# Patient Record
Sex: Female | Born: 1970 | Race: White | Hispanic: No | Smoking: Current every day smoker
Health system: Southern US, Academic
[De-identification: ages and names within clinical notes are randomized; demographics above are authoritative.]

## PROBLEM LIST (undated history)

## (undated) ENCOUNTER — Ambulatory Visit: Admission: EM | Payer: Disability Insurance | Source: Home / Self Care

## (undated) DIAGNOSIS — M129 Arthropathy, unspecified: Secondary | ICD-10-CM

## (undated) DIAGNOSIS — I359 Nonrheumatic aortic valve disorder, unspecified: Secondary | ICD-10-CM

## (undated) DIAGNOSIS — I10 Essential (primary) hypertension: Secondary | ICD-10-CM

## (undated) DIAGNOSIS — I2510 Atherosclerotic heart disease of native coronary artery without angina pectoris: Secondary | ICD-10-CM

## (undated) DIAGNOSIS — R55 Syncope and collapse: Secondary | ICD-10-CM

## (undated) DIAGNOSIS — F32A Depression, unspecified: Secondary | ICD-10-CM

## (undated) DIAGNOSIS — J449 Chronic obstructive pulmonary disease, unspecified: Secondary | ICD-10-CM

## (undated) DIAGNOSIS — F4310 Post-traumatic stress disorder, unspecified: Secondary | ICD-10-CM

## (undated) DIAGNOSIS — F329 Major depressive disorder, single episode, unspecified: Secondary | ICD-10-CM

## (undated) DIAGNOSIS — E079 Disorder of thyroid, unspecified: Secondary | ICD-10-CM

## (undated) DIAGNOSIS — F419 Anxiety disorder, unspecified: Secondary | ICD-10-CM

## (undated) DIAGNOSIS — I639 Cerebral infarction, unspecified: Secondary | ICD-10-CM

## (undated) DIAGNOSIS — G43909 Migraine, unspecified, not intractable, without status migrainosus: Secondary | ICD-10-CM

## (undated) DIAGNOSIS — Z87442 Personal history of urinary calculi: Secondary | ICD-10-CM

## (undated) DIAGNOSIS — I351 Nonrheumatic aortic (valve) insufficiency: Secondary | ICD-10-CM

## (undated) DIAGNOSIS — T4145XA Adverse effect of unspecified anesthetic, initial encounter: Secondary | ICD-10-CM

## (undated) DIAGNOSIS — G529 Cranial nerve disorder, unspecified: Secondary | ICD-10-CM

## (undated) DIAGNOSIS — A389 Scarlet fever, uncomplicated: Secondary | ICD-10-CM

## (undated) DIAGNOSIS — E049 Nontoxic goiter, unspecified: Secondary | ICD-10-CM

## (undated) DIAGNOSIS — E039 Hypothyroidism, unspecified: Secondary | ICD-10-CM

## (undated) DIAGNOSIS — T8859XA Other complications of anesthesia, initial encounter: Secondary | ICD-10-CM

## (undated) DIAGNOSIS — Z8619 Personal history of other infectious and parasitic diseases: Secondary | ICD-10-CM

## (undated) DIAGNOSIS — K219 Gastro-esophageal reflux disease without esophagitis: Secondary | ICD-10-CM

## (undated) DIAGNOSIS — IMO0001 Reserved for inherently not codable concepts without codable children: Secondary | ICD-10-CM

## (undated) DIAGNOSIS — R9389 Abnormal findings on diagnostic imaging of other specified body structures: Secondary | ICD-10-CM

## (undated) DIAGNOSIS — J45909 Unspecified asthma, uncomplicated: Secondary | ICD-10-CM

## (undated) DIAGNOSIS — D649 Anemia, unspecified: Secondary | ICD-10-CM

## (undated) DIAGNOSIS — N879 Dysplasia of cervix uteri, unspecified: Secondary | ICD-10-CM

## (undated) DIAGNOSIS — Z8661 Personal history of infections of the central nervous system: Secondary | ICD-10-CM

## (undated) HISTORY — PX: BILATERAL SALPINGOOPHORECTOMY: SHX1223

## (undated) HISTORY — PX: DENTAL SURGERY: SHX609

## (undated) HISTORY — PX: HX GALL BLADDER SURGERY/CHOLE: SHX55

## (undated) HISTORY — PX: SINUS SURGERY: SHX187

## (undated) HISTORY — PX: ABDOMINAL SURGERY: SHX537

## (undated) HISTORY — PX: NASAL SEPTUM SURGERY: SHX37

## (undated) HISTORY — DX: Chronic obstructive pulmonary disease, unspecified: J44.9

---
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
## 1993-11-05 DIAGNOSIS — R1319 Other dysphagia: Secondary | ICD-10-CM

## 1993-11-05 HISTORY — DX: Other dysphagia: R13.19

## 2004-12-06 ENCOUNTER — Emergency Department: Payer: Self-pay | Admitting: Emergency Medicine

## 2005-01-31 ENCOUNTER — Encounter: Payer: Self-pay | Admitting: Neurology

## 2005-02-03 ENCOUNTER — Encounter: Payer: Self-pay | Admitting: Neurology

## 2005-12-05 ENCOUNTER — Emergency Department: Payer: Self-pay | Admitting: Unknown Physician Specialty

## 2005-12-05 ENCOUNTER — Other Ambulatory Visit: Payer: Self-pay

## 2005-12-13 ENCOUNTER — Ambulatory Visit: Payer: Self-pay | Admitting: Internal Medicine

## 2006-04-06 ENCOUNTER — Emergency Department: Payer: Self-pay | Admitting: Emergency Medicine

## 2007-07-01 ENCOUNTER — Emergency Department: Payer: Self-pay

## 2007-10-01 ENCOUNTER — Emergency Department: Payer: Self-pay | Admitting: Internal Medicine

## 2008-05-03 ENCOUNTER — Encounter
Admission: RE | Admit: 2008-05-03 | Discharge: 2008-05-03 | Payer: Self-pay | Admitting: Physical Medicine & Rehabilitation

## 2008-10-14 ENCOUNTER — Emergency Department: Payer: Self-pay | Admitting: Emergency Medicine

## 2013-11-05 HISTORY — PX: CHOLECYSTECTOMY: SHX55

## 2013-11-24 ENCOUNTER — Emergency Department: Payer: Self-pay | Admitting: Emergency Medicine

## 2013-11-24 LAB — CBC WITH DIFFERENTIAL/PLATELET
Basophil #: 0.1 10*3/uL (ref 0.0–0.1)
Basophil %: 0.9 %
Eosinophil #: 0.1 10*3/uL (ref 0.0–0.7)
Eosinophil %: 0.6 %
HCT: 41 % (ref 35.0–47.0)
HGB: 13.9 g/dL (ref 12.0–16.0)
Lymphocyte #: 1.9 10*3/uL (ref 1.0–3.6)
Lymphocyte %: 19.7 %
MCH: 34.8 pg — ABNORMAL HIGH (ref 26.0–34.0)
MCHC: 34 g/dL (ref 32.0–36.0)
MCV: 103 fL — ABNORMAL HIGH (ref 80–100)
Monocyte #: 0.5 x10 3/mm (ref 0.2–0.9)
Monocyte %: 5.6 %
Neutrophil #: 6.9 10*3/uL — ABNORMAL HIGH (ref 1.4–6.5)
Neutrophil %: 73.2 %
Platelet: 220 10*3/uL (ref 150–440)
RBC: 4 10*6/uL (ref 3.80–5.20)
RDW: 16.3 % — ABNORMAL HIGH (ref 11.5–14.5)
WBC: 9.5 10*3/uL (ref 3.6–11.0)

## 2013-11-24 LAB — URINALYSIS, COMPLETE
Bilirubin,UR: NEGATIVE
Glucose,UR: NEGATIVE mg/dL (ref 0–75)
Ketone: NEGATIVE
Leukocyte Esterase: NEGATIVE
Nitrite: NEGATIVE
Ph: 7 (ref 4.5–8.0)
Protein: NEGATIVE
RBC,UR: 7 /HPF (ref 0–5)
Specific Gravity: 1.003 (ref 1.003–1.030)
Squamous Epithelial: 1
WBC UR: <1 /HPF (ref 0–5)

## 2013-11-24 LAB — COMPREHENSIVE METABOLIC PANEL
Albumin: 3.7 g/dL (ref 3.4–5.0)
Alkaline Phosphatase: 112 U/L
Anion Gap: 5 — ABNORMAL LOW (ref 7–16)
BUN: 6 mg/dL — ABNORMAL LOW (ref 7–18)
Bilirubin,Total: 0.4 mg/dL (ref 0.2–1.0)
Calcium, Total: 8.9 mg/dL (ref 8.5–10.1)
Chloride: 107 mmol/L (ref 98–107)
Co2: 27 mmol/L (ref 21–32)
Creatinine: 0.37 mg/dL — ABNORMAL LOW (ref 0.60–1.30)
EGFR (African American): >60
EGFR (Non-African Amer.): >60
Glucose: 93 mg/dL (ref 65–99)
Osmolality: 275 (ref 275–301)
Potassium: 4.2 mmol/L (ref 3.5–5.1)
SGOT(AST): 103 U/L — ABNORMAL HIGH (ref 15–37)
SGPT (ALT): 35 U/L (ref 12–78)
Sodium: 139 mmol/L (ref 136–145)
Total Protein: 7.2 g/dL (ref 6.4–8.2)

## 2013-11-24 LAB — LIPASE, BLOOD: Lipase: 126 U/L (ref 73–393)

## 2014-04-12 ENCOUNTER — Emergency Department: Payer: Self-pay | Admitting: Emergency Medicine

## 2014-04-20 ENCOUNTER — Inpatient Hospital Stay: Payer: Self-pay | Admitting: Surgery

## 2014-04-20 LAB — COMPREHENSIVE METABOLIC PANEL
Albumin: 4 g/dL (ref 3.4–5.0)
Alkaline Phosphatase: 109 U/L
Anion Gap: 10 (ref 7–16)
BUN: 11 mg/dL (ref 7–18)
Bilirubin,Total: 0.2 mg/dL (ref 0.2–1.0)
Calcium, Total: 9.2 mg/dL (ref 8.5–10.1)
Chloride: 104 mmol/L (ref 98–107)
Co2: 25 mmol/L (ref 21–32)
Creatinine: 0.54 mg/dL — ABNORMAL LOW (ref 0.60–1.30)
EGFR (African American): >60
EGFR (Non-African Amer.): >60
Glucose: 137 mg/dL — ABNORMAL HIGH (ref 65–99)
Osmolality: 279 (ref 275–301)
Potassium: 4 mmol/L (ref 3.5–5.1)
SGOT(AST): 38 U/L — ABNORMAL HIGH (ref 15–37)
SGPT (ALT): 52 U/L (ref 12–78)
Sodium: 139 mmol/L (ref 136–145)
Total Protein: 7.6 g/dL (ref 6.4–8.2)

## 2014-04-20 LAB — URINALYSIS, COMPLETE
Bacteria: NONE SEEN
Bilirubin,UR: NEGATIVE
Glucose,UR: NEGATIVE mg/dL (ref 0–75)
Ketone: NEGATIVE
Leukocyte Esterase: NEGATIVE
Nitrite: NEGATIVE
Ph: 6 (ref 4.5–8.0)
Protein: 30
RBC,UR: 20 /HPF (ref 0–5)
Specific Gravity: 1.021 (ref 1.003–1.030)
Squamous Epithelial: 2
WBC UR: 2 /HPF (ref 0–5)

## 2014-04-20 LAB — CBC WITH DIFFERENTIAL/PLATELET
Basophil #: 0.2 10*3/uL — ABNORMAL HIGH (ref 0.0–0.1)
Basophil %: 0.8 %
Eosinophil #: 0 10*3/uL (ref 0.0–0.7)
Eosinophil %: 0.2 %
HCT: 45 % (ref 35.0–47.0)
HGB: 14.6 g/dL (ref 12.0–16.0)
Lymphocyte #: 1.8 10*3/uL (ref 1.0–3.6)
Lymphocyte %: 9 %
MCH: 33.4 pg (ref 26.0–34.0)
MCHC: 32.4 g/dL (ref 32.0–36.0)
MCV: 103 fL — ABNORMAL HIGH (ref 80–100)
Monocyte #: 0.7 x10 3/mm (ref 0.2–0.9)
Monocyte %: 3.7 %
Neutrophil #: 16.9 10*3/uL — ABNORMAL HIGH (ref 1.4–6.5)
Neutrophil %: 86.3 %
Platelet: 321 10*3/uL (ref 150–440)
RBC: 4.36 10*6/uL (ref 3.80–5.20)
RDW: 16.5 % — ABNORMAL HIGH (ref 11.5–14.5)
WBC: 19.5 10*3/uL — ABNORMAL HIGH (ref 3.6–11.0)

## 2014-04-20 LAB — LIPASE, BLOOD: Lipase: 127 U/L (ref 73–393)

## 2014-04-20 LAB — PREGNANCY, URINE: Pregnancy Test, Urine: NEGATIVE m[IU]/mL

## 2014-04-21 LAB — CBC WITH DIFFERENTIAL/PLATELET
Basophil #: 0 10*3/uL (ref 0.0–0.1)
Basophil %: 0.5 %
Eosinophil #: 0.1 10*3/uL (ref 0.0–0.7)
Eosinophil %: 1.2 %
HCT: 37.8 % (ref 35.0–47.0)
HGB: 12.5 g/dL (ref 12.0–16.0)
Lymphocyte #: 1.3 10*3/uL (ref 1.0–3.6)
Lymphocyte %: 17.8 %
MCH: 34.2 pg — ABNORMAL HIGH (ref 26.0–34.0)
MCHC: 33 g/dL (ref 32.0–36.0)
MCV: 104 fL — ABNORMAL HIGH (ref 80–100)
Monocyte #: 0.5 x10 3/mm (ref 0.2–0.9)
Monocyte %: 6.8 %
Neutrophil #: 5.3 10*3/uL (ref 1.4–6.5)
Neutrophil %: 73.7 %
Platelet: 222 10*3/uL (ref 150–440)
RBC: 3.64 10*6/uL — ABNORMAL LOW (ref 3.80–5.20)
RDW: 16.7 % — ABNORMAL HIGH (ref 11.5–14.5)
WBC: 7.2 10*3/uL (ref 3.6–11.0)

## 2014-04-21 LAB — BASIC METABOLIC PANEL
Anion Gap: 7 (ref 7–16)
BUN: 4 mg/dL — ABNORMAL LOW (ref 7–18)
Calcium, Total: 8.6 mg/dL (ref 8.5–10.1)
Chloride: 104 mmol/L (ref 98–107)
Co2: 28 mmol/L (ref 21–32)
Creatinine: 0.38 mg/dL — ABNORMAL LOW (ref 0.60–1.30)
EGFR (African American): >60
EGFR (Non-African Amer.): >60
Glucose: 80 mg/dL (ref 65–99)
Osmolality: 273 (ref 275–301)
Potassium: 3.7 mmol/L (ref 3.5–5.1)
Sodium: 139 mmol/L (ref 136–145)

## 2014-04-21 LAB — HEPATIC FUNCTION PANEL A (ARMC)
Albumin: 3.2 g/dL — ABNORMAL LOW (ref 3.4–5.0)
Alkaline Phosphatase: 107 U/L
Bilirubin, Direct: <0.1 mg/dL (ref 0.00–0.20)
Bilirubin,Total: 0.4 mg/dL (ref 0.2–1.0)
SGOT(AST): 106 U/L — ABNORMAL HIGH (ref 15–37)
SGPT (ALT): 90 U/L — ABNORMAL HIGH (ref 12–78)
Total Protein: 6.4 g/dL (ref 6.4–8.2)

## 2014-04-21 LAB — LIPASE, BLOOD: Lipase: 69 U/L — ABNORMAL LOW (ref 73–393)

## 2014-04-22 LAB — CBC WITH DIFFERENTIAL/PLATELET
Basophil #: 0.1 10*3/uL (ref 0.0–0.1)
Basophil %: 0.9 %
Eosinophil #: 0.1 10*3/uL (ref 0.0–0.7)
Eosinophil %: 1 %
HCT: 35.4 % (ref 35.0–47.0)
HGB: 11.9 g/dL — ABNORMAL LOW (ref 12.0–16.0)
Lymphocyte #: 1.4 10*3/uL (ref 1.0–3.6)
Lymphocyte %: 15.8 %
MCH: 34.9 pg — ABNORMAL HIGH (ref 26.0–34.0)
MCHC: 33.6 g/dL (ref 32.0–36.0)
MCV: 104 fL — ABNORMAL HIGH (ref 80–100)
Monocyte #: 0.6 x10 3/mm (ref 0.2–0.9)
Monocyte %: 6.7 %
Neutrophil #: 6.7 10*3/uL — ABNORMAL HIGH (ref 1.4–6.5)
Neutrophil %: 75.6 %
Platelet: 181 10*3/uL (ref 150–440)
RBC: 3.41 10*6/uL — ABNORMAL LOW (ref 3.80–5.20)
RDW: 16.5 % — ABNORMAL HIGH (ref 11.5–14.5)
WBC: 8.9 10*3/uL (ref 3.6–11.0)

## 2014-04-22 LAB — BASIC METABOLIC PANEL
Anion Gap: 5 — ABNORMAL LOW (ref 7–16)
BUN: 3 mg/dL — ABNORMAL LOW (ref 7–18)
Calcium, Total: 8.2 mg/dL — ABNORMAL LOW (ref 8.5–10.1)
Chloride: 105 mmol/L (ref 98–107)
Co2: 26 mmol/L (ref 21–32)
Creatinine: 0.47 mg/dL — ABNORMAL LOW (ref 0.60–1.30)
EGFR (African American): >60
EGFR (Non-African Amer.): >60
Glucose: 80 mg/dL (ref 65–99)
Osmolality: 267 (ref 275–301)
Potassium: 3.8 mmol/L (ref 3.5–5.1)
Sodium: 136 mmol/L (ref 136–145)

## 2014-04-24 LAB — PATHOLOGY REPORT

## 2014-05-31 ENCOUNTER — Ambulatory Visit: Payer: Self-pay | Admitting: Surgery

## 2014-06-07 ENCOUNTER — Emergency Department: Payer: Self-pay | Admitting: Emergency Medicine

## 2014-06-15 ENCOUNTER — Other Ambulatory Visit: Payer: Self-pay | Admitting: Surgery

## 2014-06-15 LAB — COMPREHENSIVE METABOLIC PANEL
Albumin: 3.8 g/dL (ref 3.4–5.0)
Alkaline Phosphatase: 97 U/L
Anion Gap: 4 — ABNORMAL LOW (ref 7–16)
BUN: 8 mg/dL (ref 7–18)
Bilirubin,Total: 0.3 mg/dL (ref 0.2–1.0)
Calcium, Total: 8.6 mg/dL (ref 8.5–10.1)
Chloride: 108 mmol/L — ABNORMAL HIGH (ref 98–107)
Co2: 26 mmol/L (ref 21–32)
Creatinine: 0.75 mg/dL (ref 0.60–1.30)
EGFR (African American): >60
EGFR (Non-African Amer.): >60
Glucose: 101 mg/dL — ABNORMAL HIGH (ref 65–99)
Osmolality: 274 (ref 275–301)
Potassium: 3.8 mmol/L (ref 3.5–5.1)
SGOT(AST): 12 U/L — ABNORMAL LOW (ref 15–37)
SGPT (ALT): 17 U/L
Sodium: 138 mmol/L (ref 136–145)
Total Protein: 7.4 g/dL (ref 6.4–8.2)

## 2014-06-15 LAB — LIPASE, BLOOD: Lipase: 97 U/L (ref 73–393)

## 2014-07-26 DIAGNOSIS — R0602 Shortness of breath: Secondary | ICD-10-CM | POA: Insufficient documentation

## 2014-07-26 DIAGNOSIS — I350 Nonrheumatic aortic (valve) stenosis: Secondary | ICD-10-CM | POA: Insufficient documentation

## 2015-02-26 NOTE — Op Note (Signed)
PATIENT NAME:  Michelle Conley, Gloriana M MR#:  [id number] DATE OF BIRTH:  Apr 03, 1971  DATE OF PROCEDURE:  04/21/2014  PREOPERATIVE DIAGNOSIS: Acute cholecystitis.   POSTOPERATIVE DIAGNOSIS: Acute cholecystitis.     PROCEDURE PERFORMED: Laparoscopic cholecystectomy.   SURGEON: Quentin Orealph L. Ely III, MD   ANESTHESIA: General.   OPERATIVE PROCEDURE: With the patient in the supine position after the induction of appropriate general anesthesia, the patient's abdomen was prepped with ChloraPrep and draped with sterile towels. The patient was placed in the head down, feet up position. A small infraumbilical incision was made in the standard fashion, carried down bluntly through subcutaneous tissue. A Veress needle was used to cannulate the peritoneal cavity. CO2 was insufflated to appropriate pressure measurements. When approximately 2.5 liters of CO2 were instilled, the Veress needle was withdrawn and an 11 mm [employer] port was inserted into the peritoneal cavity. Intraperitoneal position was confirmed and CO2 was reinsufflated. The patient was placed in the head-up, feet-down position and rolled slightly to the left side. A subxiphoid transverse incision was made and an 11 mm port was inserted under direct vision. Two lateral ports, 5 mm in size, were inserted under direct vision. The gallbladder was distended thickened, edematous and erythematous. It was retracted superiorly and laterally, exposing the hepatoduodenal ligament. The cystic duct was identified plus a very short cystic duct. The right hepatic was immediately behind the cystic duct with a small the cystic artery going to the gallbladder. Both artery and duct were quite short. Common duct was identified. The subxiphoid port was exchanged for a 12 mm port and the Endo GIA stapling device carrying a blue load was used to divide the cystic duct.  The cystic artery was clipped and divided.  The gallbladder was then dissected free of its bed using the hook  and cautery apparatus.  The gallbladder was quite edematous and there was a significant amount of vascular attachments in the bed of the liver. The gallbladder was grasped and removed through the subxiphoid incision. The area was copiously irrigated. A 19-French Blake drain was inserted through the subxiphoid port and brought out through 1 of the  lateral stab wounds.  It was placed in the bed of the liver.  It was secured with 3-0 nylon.  The subxiphoid incision was closed with figure-of-eight suture of 0 Vicryl using the suture passer. The areas were infiltrated with 0.25% Marcaine for postoperative pain control. The abdomen was desufflated. All ports were withdrawn without difficulty. Skin incisions were closed with 5-0 nylon and sterile dressings applied. The patient was returned to the recovery room, having tolerated the procedure well. Sponge, instrument and needle counts were correct x 2 in the Operating Room.   ____________________________ Quentin Orealph L. Ely III, MD rle:cs D: 04/21/2014 16:49:00 ET T: 04/21/2014 18:03:35 ET JOB#: [id number]  cc: Quentin Orealph L. Ely III, MD, <Dictator> Quentin OreALPH L ELY MD ELECTRONICALLY SIGNED 04/23/2014 18:15

## 2015-02-26 NOTE — Consult Note (Signed)
PATIENT NAME:  Michelle Conley, Michelle Conley MR#:  [id number] DATE OF BIRTH:  [birth year]  DATE OF CONSULTATION:  04/20/2014  REFERRING PHYSICIAN:  Bronson Ing, MD  CONSULTING PHYSICIAN:  Albertine Patricia, MD  REASON FOR MEDICAL CONSULT: Preop evaluation and medical management for a patient with acute cholecystitis.   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 The patient was admitted under the surgical service and planned to have surgical intervention in the morning. Hospitalists were consulted for medical management and preop evaluation given her significant past medical history. The patient is known to have history of COPD, uses oxygen on occasion. She is still a heavy smoker, more than 1 pack or more per day, but currently she does not have any wheezing. She does not have any cough. She does not have any productive sputum. As well, the patient reports she is known to have history of cardiac murmur and MVP in the past as was evaluated by Dr. Neoma Laming in the year 2003. She denies any significant shortness of breath out of her baseline. She denies any chest pain as well. The patient reports history of CVA in the past, but per further questioning, it appears she has had episode of viral encephalitis which resulted in her having some deficits requiring physical therapy for some time, so actually did not have a CVA. It was a viral encephalitis episode. The patient has significant leukocytosis at 19,000. Her urinalysis was negative. The patient remains afebrile on the floor. As well, she was afebrile in the ED.   PAST MEDICAL HISTORY:  1. Heart murmur.  2. MVP.  3. Viral encephalitis.   4. Asthma.  5. COPD.   6.  Hypothyroidism.   PAST SURGICAL HISTORY:  1. C-section.  2. Nasal septum repair.  3. Thyroid radioablation.   SOCIAL HISTORY: The patient smokes 1 pack per day. Denies alcohol or illicit drug use.   FAMILY HISTORY: Denies any family history of coronary artery disease at a young age.   ALLERGIES: CODEINE, ELAVIL, FLAGYL, FLEXERIL, NEXIUM, PENICILLIN, PROZAC, ULTRAM, VIOXX, ZOLOFT.   HOME MEDICATIONS:  1. Alprazolam 2 mg oral 1 tablet every 4 hours as needed.  2. Levothyroxine 300 mcg oral daily.  3. ProAir as needed.  4. Soma 350 mg oral 4 times a day as needed.  5. Spiriva 18 mcg inhalational daily.   REVIEW OF SYSTEMS:  CONSTITUTIONAL: The patient denies fever, chills. Complains of fatigue, weakness.  EYES: Denies blurry vision, double vision, inflammation.  ENT: Denies tinnitus, ear pain, hearing loss, epistaxis.  RESPIRATORY: Denies cough, wheezing, hemoptysis. Reports history of COPD and baseline shortness of breath.  CARDIOVASCULAR: Denies chest pain, edema, palpitation, syncope.  GASTROINTESTINAL: Denies diarrhea, constipation, coffee-ground emesis. Reports nausea, abdominal pain.  GENITOURINARY: Denies dysuria, hematuria or renal colic.  ENDOCRINE: Denies polyuria, polydipsia, heat or cold intolerance. As well, reports hypothyroidism.  HEMATOLOGY: Denies anemia, easy bruising, bleeding diathesis.  INTEGUMENTARY: Denies acne, rash or skin lesion.  MUSCULOSKELETAL: Denies any swelling, gout, cramps.  NEUROLOGIC: Denies any history of focal deficits, numbness, weakness. 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  PSYCHIATRIC: Reports history of anxiety. Denies insomnia or depression.   PHYSICAL EXAMINATION:  VITAL SIGNS: Temperature 97.8, pulse 66, respiratory rate 14, blood pressure 155/76,  GENERAL:  Well-nourished female, looks comfortable in bed, in no apparent distress.  HEENT: Head atraumatic, normocephalic. Pupils equal, reactive to light. Pink conjunctivae.  Anicteric sclerae. Moist oral mucosa.  NECK: Supple. No thyromegaly. No JVD.  CHEST: Good air entry bilaterally. No wheezing, rales or rhonchi.  CARDIOVASCULAR: S1, S2 heard. No rubs, murmurs or gallops.  ABDOMEN: Tender to palpation in the epigastric area and right upper quadrant. Otherwise bowel sounds present. No rebound. No guarding. No distention.  EXTREMITIES: No edema. No clubbing. No cyanosis. Pedal pulses felt bilaterally.  PSYCHIATRIC: Appropriate affect. Awake, alert x 3. Intact judgment and insight.  NEUROLOGIC: Cranial nerves grossly intact. Motor 5 out of 5. No focal deficits.  MUSCULOSKELETAL: No joint effusion or erythema.  SKIN: Normal skin turgor. Warm and dry.   PERTINENT LABORATORIES: Glucose 137, BUN 11, creatinine 0.54, sodium 139, potassium 4, chloride 104, CO2 25. ALT 52, AST 38, alk phos 109. White blood cells 19.5, hemoglobin 14.6, hematocrit 45, platelets 321. Urinalysis negative for leukocyte esterase and nitrite.   EKG showing normal sinus rhythm without significant ST or T wave abnormality, at 73 beats per minute.   IMAGING: Ultrasound showing changes consistent with acute cholecystitis in the appropriate clinical setting and mild common bile duct dilatation is noted as well.   ASSESSMENT AND PLAN:  1. Acute cholecystitis with preoperative evaluation: The patient is on intravenous antibiotics. Planned to have surgery in the morning. She is known to have history of chronic obstructive pulmonary disease but does not appear to be in chronic obstructive pulmonary disease exacerbation. Will start her on DuoNebs and p.r.n. albuterol. Continue her with Spiriva and p.r.n. oxygen and given her cardiac history, I could not appreciate any significant murmur on physical exam. She does not have any acute findings on her EKG, but we will obtain a 2-D echocardiogram in the morning. If the patient has no significant valvular disease, can proceed with surgery. Will keep her on p.r.n.  oxygen as well.  2. History of chronic obstructive pulmonary disease: The patient has no active wheezing. Continue with Spiriva, DuoNeb and p.r.n. albuterol.  3. Hypothyroidism: Continue with Synthroid.  4. History of asthma: Continue with Spiriva, DuoNebs and p.r.n. albuterol.  5. Tobacco abuse: The patient was counseled.  6. Deep vein thrombosis prophylaxis: The patient is on sequential compression devices and thromboembolic deterrent hose. Will recommend her to be started on chemical anticoagulation after surgery.   ____________________________ Albertine Patricia, MD dse:gb D: 04/20/2014 23:35:33 ET T: 04/21/2014 01:02:22 ET JOB#: [id number]  cc: Albertine Patricia, MD, <Dictator> DAWOOD Graciela Husbands MD ELECTRONICALLY SIGNED 04/21/2014 1:53

## 2015-02-26 NOTE — Discharge Summary (Signed)
PATIENT NAME:  Michelle Conley, Michelle Conley MR#:  [id number] DATE OF BIRTH:  Mar 12, 1971  DATE OF ADMISSION:  04/20/2014 DATE OF DISCHARGE:  04/23/2014  BRIEF HISTORY:  Michelle Conley is a 44 year old women admitted to the Emergency Room with clinical presentation and work-up with imaging and laboratory work suggestive of acute cholecystitis. She had similar symptoms in the past, none of which were as severe as the current episode. She had multiple other medical problems including possible valvular heart disease, mitral valve prolapse, chronic obstructive lung disease, tobacco abuse, and previous stroke. She was admitted, evaluated by cardiology and medicine. Work-up revealed severe aortic regurgitation, but no evidence of any significant contraindications for surgical intervention. She was taken to surgery on June 17, where she underwent a laparoscopic cholecystectomy. The procedure was uncomplicated. She had severe cholecystitis. There was no sign of any ductal obstruction. The surgery was uncomplicated. She had some mild pain and nausea control problems after surgery. She was discharged home today to be followed in the office in 7-10 days' time for follow-up and wound evaluation. Jackson-Pratt drain was removed prior to discharge.   DISCHARGE MEDICATIONS: Include levothyroxine 0.3 mg once a day, soma 350 mg q.i.d., alprazolam 2 mg q.i.d., ProAir 90 two puffs q. 4 hours, Spiriva 18 mcg once a day, and Percocet 5/325 every six hours p.r.n. pain.  FINAL DISCHARGE DIAGNOSES: Acute cholecystitis, chronic obstructive lung disease, aortic regurgitation, previous stroke.   ____________________________ Carmie Endalph L. Ely III, MD rle:ts D: 04/23/2014 12:21:21 ET T: 04/23/2014 13:29:28 ET JOB#: [id number]  cc: Quentin Orealph L. Ely III, MD, <Dictator> Alan MulderShamil J. Morayati, MD Meindert A. Lacie ScottsNiemeyer, MD Quentin OreALPH L ELY MD ELECTRONICALLY SIGNED 04/23/2014 18:19

## 2015-05-03 DIAGNOSIS — I351 Nonrheumatic aortic (valve) insufficiency: Secondary | ICD-10-CM | POA: Insufficient documentation

## 2015-05-03 DIAGNOSIS — E782 Mixed hyperlipidemia: Secondary | ICD-10-CM | POA: Insufficient documentation

## 2015-05-20 DIAGNOSIS — I10 Essential (primary) hypertension: Secondary | ICD-10-CM | POA: Insufficient documentation

## 2015-06-07 NOTE — H&P (Signed)
  Michelle Conley is an 44 y.o. female.   Chief Complaint: "Extractions for dentures" HPI: Michelle Conley is a 44 year old female that was referred by her general dentist for the extraction of all her remaining teeth in preparation for a denture.  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  Given her history and severe anxiety she will require having her surgery performed in an Operating Room setting under general anesthesia.    PMHx: Currently sees Dr. Arnoldo Hooker Non-Rheumatic Aortic Insufficiency, Stroke - hospitalized in 2003 (associated with the viral encephalitis - happened during the coma event and had temporary right side paralysis)  Mitral valve prolapse - follows up with cardiologist every year - premedicates Asthma Thyroid - had radition therapy and takes meds to supplement Chronic fatigue Severe anxiety - takes xanax daily, depression, PTSD Electric shock therapy.  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   PSx: Gallbladder removed, c section, cervical dysplasia - removed cervix (1995), cholecystectomy (2015)  Family Hx: Mother - small cell carcinoma  Social History:  Smokes tobacco: 2 ppd - 20 year hx  Allergies: Flagyl, flexeril, nexium, tramadol, vioxx, penicillin  Meds:  Tylenol #3, xanax, amoxicillin, epipen, lexapro, ibuprofen, synthroid, proair, qvar  Labs: No results found for this or any previous visit (from the past 48 hour(s)).  Radiology: No results found.  ROS: Pertinent items are noted in HPI.  Vitals:  BP: 110/69, Pulse: 68, RR: 12, SpO2: 99%, Temp 98.4  Physical Exam: General appearance: alert and cooperative Head: Normocephalic, without obvious abnormality, atraumatic Eyes: positive findings: pupillary abnormality: anisocoria both eyes Ears: normal TM's and external ear canals both ears Nose: Nares normal. Septum midline. Mucosa normal. No drainage or sinus  tenderness. Throat: abnormal findings: dentition: multiple carries Cardio: aortic insuff. and mitral prolapse GI: soft, non-tender; bowel sounds normal; no masses,  no organomegaly Extremities: extremities normal, atraumatic, no cyanosis or edema Skin: Skin color, texture, turgor normal. No rashes or lesions Neurologic: Alert and oriented X 3, normal strength and tone. Normal symmetric reflexes. Normal coordination and gait Incision/Wound:none  Panorex in office shows rampant caries and mild/moderate periodontal bone loss generalized throughout maxilla and mandible.  Assessment/Plan Michelle Conley has rampant caries and mild/moderate general chronic periodontal disease and will require extraction of all her remaining teeth and four quadrants of alveoloplasty.  The teeth included are: 2, 3, 4, 5, 6, 7, 8, 9, 10, 11, 12, 13, 14, 15, 19, 20, 21, 22, 23, 24, 25, 26, 27, 28, 29.    [place],Michelle Conley  06/07/2015, 6:20 PM

## 2015-06-13 ENCOUNTER — Encounter (HOSPITAL_COMMUNITY)
Admission: RE | Admit: 2015-06-13 | Discharge: 2015-06-13 | Disposition: A | Discharge status: Home / Self Care | Payer: Medicaid Other | Source: Ambulatory Visit | Attending: Oral and Maxillofacial Surgery | Admitting: Oral and Maxillofacial Surgery

## 2015-06-13 ENCOUNTER — Encounter (HOSPITAL_COMMUNITY): Payer: Self-pay

## 2015-06-13 DIAGNOSIS — E039 Hypothyroidism, unspecified: Secondary | ICD-10-CM | POA: Insufficient documentation

## 2015-06-13 DIAGNOSIS — Z8673 Personal history of transient ischemic attack (TIA), and cerebral infarction without residual deficits: Secondary | ICD-10-CM | POA: Diagnosis not present

## 2015-06-13 DIAGNOSIS — K219 Gastro-esophageal reflux disease without esophagitis: Secondary | ICD-10-CM | POA: Diagnosis not present

## 2015-06-13 DIAGNOSIS — Z79899 Other long term (current) drug therapy: Secondary | ICD-10-CM | POA: Diagnosis not present

## 2015-06-13 DIAGNOSIS — Z01812 Encounter for preprocedural laboratory examination: Secondary | ICD-10-CM | POA: Diagnosis not present

## 2015-06-13 DIAGNOSIS — Z8661 Personal history of infections of the central nervous system: Secondary | ICD-10-CM | POA: Diagnosis not present

## 2015-06-13 DIAGNOSIS — J45909 Unspecified asthma, uncomplicated: Secondary | ICD-10-CM | POA: Diagnosis not present

## 2015-06-13 DIAGNOSIS — I498 Other specified cardiac arrhythmias: Secondary | ICD-10-CM | POA: Insufficient documentation

## 2015-06-13 DIAGNOSIS — Z01818 Encounter for other preprocedural examination: Secondary | ICD-10-CM | POA: Diagnosis not present

## 2015-06-13 HISTORY — DX: Syncope and collapse: R55

## 2015-06-13 HISTORY — DX: Major depressive disorder, single episode, unspecified: F32.9

## 2015-06-13 HISTORY — DX: Gastro-esophageal reflux disease without esophagitis: K21.9

## 2015-06-13 HISTORY — DX: Nonrheumatic aortic valve disorder, unspecified: I35.9

## 2015-06-13 HISTORY — DX: Personal history of infections of the central nervous system: Z86.61

## 2015-06-13 HISTORY — DX: Reserved for inherently not codable concepts without codable children: IMO0001

## 2015-06-13 HISTORY — DX: Migraine, unspecified, not intractable, without status migrainosus: G43.909

## 2015-06-13 HISTORY — DX: Nonrheumatic aortic (valve) insufficiency: I35.1

## 2015-06-13 HISTORY — DX: Nontoxic goiter, unspecified: E04.9

## 2015-06-13 HISTORY — DX: Hypothyroidism, unspecified: E03.9

## 2015-06-13 HISTORY — DX: Cerebral infarction, unspecified: I63.9

## 2015-06-13 HISTORY — DX: Adverse effect of unspecified anesthetic, initial encounter: T41.45XA

## 2015-06-13 HISTORY — DX: Unspecified asthma, uncomplicated: J45.909

## 2015-06-13 HISTORY — DX: Anxiety disorder, unspecified: F41.9

## 2015-06-13 HISTORY — DX: Depression, unspecified: F32.A

## 2015-06-13 HISTORY — DX: Cranial nerve disorder, unspecified: G52.9

## 2015-06-13 HISTORY — DX: Other complications of anesthesia, initial encounter: T88.59XA

## 2015-06-13 HISTORY — DX: Post-traumatic stress disorder, unspecified: F43.10

## 2015-06-13 HISTORY — DX: Scarlet fever, uncomplicated: A38.9

## 2015-06-13 HISTORY — DX: Abnormal findings on diagnostic imaging of other specified body structures: R93.89

## 2015-06-13 HISTORY — DX: Anemia, unspecified: D64.9

## 2015-06-13 HISTORY — DX: Personal history of other infectious and parasitic diseases: Z86.19

## 2015-06-13 HISTORY — DX: Dysplasia of cervix uteri, unspecified: N87.9

## 2015-06-13 HISTORY — DX: Personal history of urinary calculi: Z87.442

## 2015-06-13 HISTORY — DX: Essential (primary) hypertension: I10

## 2015-06-13 LAB — COMPREHENSIVE METABOLIC PANEL
ALT: 10 U/L — ABNORMAL LOW (ref 14–54)
AST: 16 U/L (ref 15–41)
Albumin: 4 g/dL (ref 3.5–5.0)
Alkaline Phosphatase: 75 U/L (ref 38–126)
Anion gap: 5 (ref 5–15)
BUN: 9 mg/dL (ref 6–20)
CO2: 28 mmol/L (ref 22–32)
Calcium: 9.2 mg/dL (ref 8.9–10.3)
Chloride: 106 mmol/L (ref 101–111)
Creatinine, Ser: 1.02 mg/dL — ABNORMAL HIGH (ref 0.44–1.00)
GFR calc Af Amer: >60 mL/min (ref >60)
GFR calc non Af Amer: >60 mL/min (ref >60)
Glucose, Bld: 83 mg/dL (ref 65–99)
Potassium: 4.2 mmol/L (ref 3.5–5.1)
Sodium: 139 mmol/L (ref 135–145)
Total Bilirubin: 0.3 mg/dL (ref 0.3–1.2)
Total Protein: 6.7 g/dL (ref 6.5–8.1)

## 2015-06-13 LAB — HCG, SERUM, QUALITATIVE: Preg, Serum: NEGATIVE

## 2015-06-13 LAB — CBC
HCT: 42.6 % (ref 36.0–46.0)
Hemoglobin: 14 g/dL (ref 12.0–15.0)
MCH: 29.9 pg (ref 26.0–34.0)
MCHC: 32.9 g/dL (ref 30.0–36.0)
MCV: 91 fL (ref 78.0–100.0)
Platelets: 204 10*3/uL (ref 150–400)
RBC: 4.68 MIL/uL (ref 3.87–5.11)
RDW: 13.9 % (ref 11.5–15.5)
WBC: 10 10*3/uL (ref 4.0–10.5)

## 2015-06-13 NOTE — Progress Notes (Signed)
Patient denies Chest Pain, shob. Reports that PCP is Curator. Cardiologist is Dr. Gwen Pounds. Stress test in care everywhere. Spoke to [place] regarding patient report of goiter that she has been told she needs surgery for. Will request records from anesthesia in 2015.  Requested: CXR from [facility]; 2015 Anesthesia Records from [facility]; EKG Dr. Gwen Pounds.

## 2015-06-13 NOTE — Pre-Procedure Instructions (Signed)
MERRIAM BRANDNER  06/13/2015     Your procedure is scheduled on August 15.  Report to [facility] Admitting at 5:30 A.M.  Call this number if you have problems the morning of surgery:  [phone]   Remember:  Do not eat food or drink liquids after midnight.  Take these medicines the morning of surgery with A SIP OF WATER Tylenol #3, Xanax, Clindamycin, Hydrocodone (if needed), Levothyroxine, Proair, Spiriva, Omeprazole, Lexapro   STOP Ibuprofen today   STOP/ Do not take Aspirin, Aleve, Naproxen, Advil, Ibuprofen, Motrin, Vitamins, Herbs, or Supplements starting today   Do not wear jewelry, make-up or nail polish.  Do not wear lotions, powders, or perfumes.  You may wear deodorant.  Do not shave 48 hours prior to surgery.  Men may shave face and neck.  Do not bring valuables to the hospital.  [facility] is not responsible for any belongings or valuables.  Contacts, dentures or bridgework may not be worn into surgery.  Leave your suitcase in the car.  After surgery it may be brought to your room.  For patients admitted to the hospital, discharge time will be determined by your treatment team.  Patients discharged the day of surgery will not be allowed to drive home.   [place] - Preparing for Surgery  Before surgery, you can play an important role.  Because skin is not sterile, your skin needs to be as free of germs as possible.  You can reduce the number of germs on you skin by washing with CHG (chlorahexidine gluconate) soap before surgery.  CHG is an antiseptic cleaner which kills germs and bonds with the skin to continue killing germs even after washing.  Please DO NOT use if you have an allergy to CHG or antibacterial soaps.  If your skin becomes reddened/irritated stop using the CHG and inform your nurse when you arrive at Short Stay.  Do not shave (including legs and underarms) for at least 48 hours prior to the first CHG shower.  You may shave your  face.  Please follow these instructions carefully:   1.  Shower with CHG Soap the night before surgery and the morning of Surgery.  2.  If you choose to wash your hair, wash your hair first as usual with your normal shampoo.  3.  After you shampoo, rinse your hair and body thoroughly to remove the shampoo.  4.  Use CHG as you would any other liquid soap.  You can apply CHG directly to the skin and wash gently with scrungie or a clean washcloth.  5.  Apply the CHG Soap to your body ONLY FROM THE NECK DOWN.  Do not use on open wounds or open sores.  Avoid contact with your eyes, ears, mouth and genitals (private parts).  Wash genitals (private parts) with your normal soap.  6.  Wash thoroughly, paying special attention to the area where your surgery will be performed.  7.  Thoroughly rinse your body with warm water from the neck down.  8.  DO NOT shower/wash with your normal soap after using and rinsing off the CHG Soap.  9.  Pat yourself dry with a clean towel.            10.  Wear clean pajamas.            11.  Place clean sheets on your bed the night of your first shower and do not sleep with pets.  Day of  Surgery  Do not apply any lotions the morning of surgery.  Please wear clean clothes to the hospital/surgery center.    Please read over the following fact sheets that you were given. Pain Booklet, Coughing and Deep Breathing and Surgical Site Infection Prevention

## 2015-06-13 NOTE — Progress Notes (Signed)
   06/13/15 1340  OBSTRUCTIVE SLEEP APNEA  Have you ever been diagnosed with sleep apnea through a sleep study? No  Do you snore loudly (loud enough to be heard through closed doors)?  1  Do you often feel tired, fatigued, or sleepy during the daytime? 1  Has anyone observed you stop breathing during your sleep? 1  Do you have, or are you being treated for high blood pressure? 1  BMI more than 35 kg/m2? 0  Age over 42 years old? 0  Neck circumference greater than 40 cm/16 inches? 0  Gender: 0  Obstructive Sleep Apnea Score 4

## 2015-06-14 NOTE — Progress Notes (Signed)
Anesthesia Chart Review: Patient is a 44 year old female scheduled for multiple teeth extractions, upper labial frenectomy on 06/20/15 by Dr. Jeanice Lim.   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 PCP is listed as Dr. Lacie Scotts ([facility]). Saw neurologist Dr. Malvin Johns within the past year for syncope--EEG WNL. Also saw cardiologist Dr. Gwen Pounds (see below).   Meds include albuterol, Xanax, amoxicillin and clindamycin (not yet started), Qvar, Lexapro, Norco, levothyroxine, Prilosec, Zocor, Spiriva, trazodone.  Anesthesia record from 04/20/14 [facility]) obtained due to her history of goiter and whether or not she had been a difficult intubation at that time. Records indicate that she was successfully intubated with a 7.0 ETT using a MAC 3 blade with one attempt.   Patient was recently referred to cardiologist Dr. Arnoldo Hooker [facility], see Care Everywhere) for evaluation of syncope and preoperative risk assessment for teeth removal. His plan states:  -Proceed to surgery and/or invasive procedure without restriction to pre or post operative and/or procedural care. The patient is at lowest risk possible for cardiovascular complications with surgical intervention and/or invasive procedure. Currently has no evidence active and/or significant angina and/or congestive heart failure. The patient may discontinue aspirin 7 days prior to procedure and restart at a safe period thereafter -The patient should receive antibiotic prophylaxis for teeth cleaning as well as other significant invasive procedures to reduce the possibility of endocarditis -the patient understands the risks and benefits of  medication management for cardiovascular risk factors including lipid management. We plan on continuing to reduce cardiovascular risk by lowering LDL between 30% and 50% if achievable. We have discussed other lifestyle measures to help with this risk reduction as well. -We have discussed the risks of progression of aortic insufficiency and the need for continued monitoring for symptoms including shortness of breath, syncope, dizziness, weakness, peripheral edema, and chest pain.  08/10/14 Stress Echo (See Care Everywhere): Normal stress echocardiogram. Normal LV size and contraction. Moderate AR, no AS. Mild MR. Trivial TR/PR.  06/13/15 EKG: SB at 59 bpm with sinus arrhythmia.   CXR from [facility] still pending.   Preoperative labs noted.   If no acute changes then I would anticipate that she can proceed as planned.  Velna Ochs [facility] Short Stay Center/Anesthesiology Phone [phone] 06/14/2015 2:52 PM

## 2015-06-17 NOTE — Progress Notes (Signed)
Call to [facility] practice,  For CXR report as previously requested. Office is on midday break. Will try later.

## 2015-06-19 MED ORDER — CLINDAMYCIN PHOSPHATE 600 MG/50ML IV SOLN
600.0000 mg | INTRAVENOUS | Status: AC
  Administered 2015-06-20: 600 mg via INTRAVENOUS
  Filled 2015-06-19: qty 50

## 2015-06-20 ENCOUNTER — Encounter (HOSPITAL_COMMUNITY)
Admission: RE | Disposition: A | Discharge status: Home / Self Care | Payer: Self-pay | Source: Ambulatory Visit | Attending: Oral and Maxillofacial Surgery

## 2015-06-20 ENCOUNTER — Ambulatory Visit (HOSPITAL_COMMUNITY): Payer: Medicaid Other | Admitting: Vascular Surgery

## 2015-06-20 ENCOUNTER — Ambulatory Visit (HOSPITAL_COMMUNITY)
Admission: RE | Admit: 2015-06-20 | Discharge: 2015-06-20 | Disposition: A | Discharge status: Home / Self Care | Payer: Medicaid Other | Source: Ambulatory Visit | Attending: Oral and Maxillofacial Surgery | Admitting: Oral and Maxillofacial Surgery

## 2015-06-20 ENCOUNTER — Ambulatory Visit (HOSPITAL_COMMUNITY): Payer: Medicaid Other | Admitting: Anesthesiology

## 2015-06-20 ENCOUNTER — Encounter (HOSPITAL_COMMUNITY): Payer: Self-pay | Admitting: Anesthesiology

## 2015-06-20 DIAGNOSIS — R5382 Chronic fatigue, unspecified: Secondary | ICD-10-CM | POA: Diagnosis not present

## 2015-06-20 DIAGNOSIS — I10 Essential (primary) hypertension: Secondary | ICD-10-CM | POA: Insufficient documentation

## 2015-06-20 DIAGNOSIS — Z886 Allergy status to analgesic agent status: Secondary | ICD-10-CM | POA: Diagnosis not present

## 2015-06-20 DIAGNOSIS — Z880 Allergy status to penicillin: Secondary | ICD-10-CM | POA: Diagnosis not present

## 2015-06-20 DIAGNOSIS — K056 Periodontal disease, unspecified: Secondary | ICD-10-CM | POA: Insufficient documentation

## 2015-06-20 DIAGNOSIS — Z888 Allergy status to other drugs, medicaments and biological substances status: Secondary | ICD-10-CM | POA: Diagnosis not present

## 2015-06-20 DIAGNOSIS — I341 Nonrheumatic mitral (valve) prolapse: Secondary | ICD-10-CM | POA: Insufficient documentation

## 2015-06-20 DIAGNOSIS — H4900 Third [oculomotor] nerve palsy, unspecified eye: Secondary | ICD-10-CM | POA: Diagnosis not present

## 2015-06-20 DIAGNOSIS — K130 Diseases of lips: Secondary | ICD-10-CM | POA: Diagnosis not present

## 2015-06-20 DIAGNOSIS — G510 Bell's palsy: Secondary | ICD-10-CM | POA: Diagnosis not present

## 2015-06-20 DIAGNOSIS — I351 Nonrheumatic aortic (valve) insufficiency: Secondary | ICD-10-CM | POA: Insufficient documentation

## 2015-06-20 DIAGNOSIS — K029 Dental caries, unspecified: Secondary | ICD-10-CM | POA: Insufficient documentation

## 2015-06-20 DIAGNOSIS — F430 Acute stress reaction: Secondary | ICD-10-CM | POA: Insufficient documentation

## 2015-06-20 DIAGNOSIS — Z8673 Personal history of transient ischemic attack (TIA), and cerebral infarction without residual deficits: Secondary | ICD-10-CM | POA: Diagnosis not present

## 2015-06-20 DIAGNOSIS — F4310 Post-traumatic stress disorder, unspecified: Secondary | ICD-10-CM | POA: Insufficient documentation

## 2015-06-20 DIAGNOSIS — F17210 Nicotine dependence, cigarettes, uncomplicated: Secondary | ICD-10-CM | POA: Diagnosis not present

## 2015-06-20 DIAGNOSIS — K068 Other specified disorders of gingiva and edentulous alveolar ridge: Secondary | ICD-10-CM | POA: Insufficient documentation

## 2015-06-20 DIAGNOSIS — F329 Major depressive disorder, single episode, unspecified: Secondary | ICD-10-CM | POA: Diagnosis not present

## 2015-06-20 DIAGNOSIS — F411 Generalized anxiety disorder: Secondary | ICD-10-CM | POA: Insufficient documentation

## 2015-06-20 DIAGNOSIS — Z923 Personal history of irradiation: Secondary | ICD-10-CM | POA: Diagnosis not present

## 2015-06-20 DIAGNOSIS — Z79899 Other long term (current) drug therapy: Secondary | ICD-10-CM | POA: Diagnosis not present

## 2015-06-20 DIAGNOSIS — J45909 Unspecified asthma, uncomplicated: Secondary | ICD-10-CM | POA: Diagnosis not present

## 2015-06-20 DIAGNOSIS — E039 Hypothyroidism, unspecified: Secondary | ICD-10-CM | POA: Insufficient documentation

## 2015-06-20 DIAGNOSIS — Z881 Allergy status to other antibiotic agents status: Secondary | ICD-10-CM | POA: Diagnosis not present

## 2015-06-20 HISTORY — PX: LINGUAL FRENECTOMY: SHX6357

## 2015-06-20 HISTORY — PX: MULTIPLE EXTRACTIONS WITH ALVEOLOPLASTY: SHX5342

## 2015-06-20 SURGERY — MULTIPLE EXTRACTION WITH ALVEOLOPLASTY
Anesthesia: General | Site: Mouth

## 2015-06-20 MED ORDER — OXYMETAZOLINE HCL 0.05 % NA SOLN
NASAL | Status: AC
  Filled 2015-06-20: qty 15

## 2015-06-20 MED ORDER — ROCURONIUM BROMIDE 100 MG/10ML IV SOLN
INTRAVENOUS | Status: DC | PRN
  Administered 2015-06-20: 35 mg via INTRAVENOUS

## 2015-06-20 MED ORDER — MIDAZOLAM HCL 2 MG/2ML IJ SOLN
INTRAMUSCULAR | Status: AC
  Filled 2015-06-20: qty 4

## 2015-06-20 MED ORDER — NEOSTIGMINE METHYLSULFATE 10 MG/10ML IV SOLN
INTRAVENOUS | Status: AC
  Filled 2015-06-20: qty 1

## 2015-06-20 MED ORDER — ROCURONIUM BROMIDE 50 MG/5ML IV SOLN
INTRAVENOUS | Status: AC
  Filled 2015-06-20: qty 1

## 2015-06-20 MED ORDER — OXYMETAZOLINE HCL 0.05 % NA SOLN
NASAL | Status: DC | PRN
  Administered 2015-06-20: 1

## 2015-06-20 MED ORDER — LACTATED RINGERS IV SOLN
INTRAVENOUS | Status: DC | PRN
  Administered 2015-06-20 (×2): via INTRAVENOUS

## 2015-06-20 MED ORDER — BUPIVACAINE-EPINEPHRINE (PF) 0.5% -1:200000 IJ SOLN
INTRAMUSCULAR | Status: AC
  Filled 2015-06-20: qty 7.2

## 2015-06-20 MED ORDER — PROPOFOL 10 MG/ML IV BOLUS
INTRAVENOUS | Status: AC
  Filled 2015-06-20: qty 20

## 2015-06-20 MED ORDER — ARTIFICIAL TEARS OP OINT
TOPICAL_OINTMENT | OPHTHALMIC | Status: DC | PRN
  Administered 2015-06-20: 1 via OPHTHALMIC

## 2015-06-20 MED ORDER — NEOSTIGMINE METHYLSULFATE 10 MG/10ML IV SOLN
INTRAVENOUS | Status: DC | PRN
  Administered 2015-06-20: 3 mg via INTRAVENOUS

## 2015-06-20 MED ORDER — PROMETHAZINE HCL 25 MG/ML IJ SOLN: 6.2500 mg | INTRAMUSCULAR | Status: DC | PRN

## 2015-06-20 MED ORDER — GLYCOPYRROLATE 0.2 MG/ML IJ SOLN
INTRAMUSCULAR | Status: DC | PRN
  Administered 2015-06-20: 0.4 mg via INTRAVENOUS

## 2015-06-20 MED ORDER — LIDOCAINE-EPINEPHRINE 2 %-1:100000 IJ SOLN
INTRAMUSCULAR | Status: AC
  Filled 2015-06-20: qty 6.8

## 2015-06-20 MED ORDER — LIDOCAINE HCL (CARDIAC) 20 MG/ML IV SOLN
INTRAVENOUS | Status: AC
  Filled 2015-06-20: qty 5

## 2015-06-20 MED ORDER — ONDANSETRON HCL 4 MG/2ML IJ SOLN
INTRAMUSCULAR | Status: AC
  Filled 2015-06-20: qty 2

## 2015-06-20 MED ORDER — ONDANSETRON HCL 4 MG/2ML IJ SOLN
INTRAMUSCULAR | Status: DC | PRN
  Administered 2015-06-20: 4 mg via INTRAVENOUS

## 2015-06-20 MED ORDER — BUPIVACAINE-EPINEPHRINE 0.5% -1:200000 IJ SOLN
INTRAMUSCULAR | Status: DC | PRN
  Administered 2015-06-20 (×4): 1.7 mL

## 2015-06-20 MED ORDER — GLYCOPYRROLATE 0.2 MG/ML IJ SOLN
INTRAMUSCULAR | Status: AC
  Filled 2015-06-20: qty 2

## 2015-06-20 MED ORDER — ARTIFICIAL TEARS OP OINT
TOPICAL_OINTMENT | OPHTHALMIC | Status: AC
  Filled 2015-06-20: qty 3.5

## 2015-06-20 MED ORDER — LIDOCAINE-EPINEPHRINE 2 %-1:100000 IJ SOLN
INTRAMUSCULAR | Status: AC
  Filled 2015-06-20: qty 10.2

## 2015-06-20 MED ORDER — DEXAMETHASONE SODIUM PHOSPHATE 4 MG/ML IJ SOLN
INTRAMUSCULAR | Status: DC | PRN
  Administered 2015-06-20: 4 mg via INTRAVENOUS

## 2015-06-20 MED ORDER — LIDOCAINE HCL (PF) 2 % IJ SOLN
INTRAMUSCULAR | Status: DC | PRN
  Administered 2015-06-20 (×10): 1.8 mL via INTRADERMAL

## 2015-06-20 MED ORDER — FENTANYL CITRATE (PF) 250 MCG/5ML IJ SOLN
INTRAMUSCULAR | Status: AC
  Filled 2015-06-20: qty 5

## 2015-06-20 MED ORDER — PROPOFOL 10 MG/ML IV BOLUS
INTRAVENOUS | Status: DC | PRN
  Administered 2015-06-20: 160 mg via INTRAVENOUS

## 2015-06-20 MED ORDER — ISOPROPYL ALCOHOL 70 % SOLN
Status: DC | PRN
  Administered 2015-06-20: 1 via TOPICAL

## 2015-06-20 MED ORDER — 0.9 % SODIUM CHLORIDE (POUR BTL) OPTIME
TOPICAL | Status: DC | PRN
  Administered 2015-06-20: 1000 mL

## 2015-06-20 MED ORDER — FENTANYL CITRATE (PF) 100 MCG/2ML IJ SOLN
INTRAMUSCULAR | Status: DC | PRN
  Administered 2015-06-20 (×5): 50 ug via INTRAVENOUS

## 2015-06-20 MED ORDER — LIDOCAINE HCL (CARDIAC) 20 MG/ML IV SOLN
INTRAVENOUS | Status: DC | PRN
  Administered 2015-06-20: 7 mg via INTRAVENOUS

## 2015-06-20 MED ORDER — SUCCINYLCHOLINE CHLORIDE 20 MG/ML IJ SOLN
INTRAMUSCULAR | Status: AC
  Filled 2015-06-20: qty 1

## 2015-06-20 MED ORDER — FENTANYL CITRATE (PF) 100 MCG/2ML IJ SOLN: 25.0000 ug | INTRAMUSCULAR | Status: DC | PRN

## 2015-06-20 MED ORDER — MIDAZOLAM HCL 5 MG/5ML IJ SOLN
INTRAMUSCULAR | Status: DC | PRN
  Administered 2015-06-20: 2 mg via INTRAVENOUS

## 2015-06-20 MED ORDER — DEXAMETHASONE SODIUM PHOSPHATE 4 MG/ML IJ SOLN
INTRAMUSCULAR | Status: AC
  Filled 2015-06-20: qty 1

## 2015-06-20 MED ORDER — EPHEDRINE SULFATE 50 MG/ML IJ SOLN
INTRAMUSCULAR | Status: DC | PRN
  Administered 2015-06-20 (×4): 5 mg via INTRAVENOUS
  Administered 2015-06-20: 10 mg via INTRAVENOUS

## 2015-06-20 MED ORDER — EPHEDRINE SULFATE 50 MG/ML IJ SOLN
INTRAMUSCULAR | Status: AC
  Filled 2015-06-20: qty 1

## 2015-06-20 SURGICAL SUPPLY — 53 items
ATTRACTOMAT 16X20 MAGNETIC DRP (DRAPES) ×3 IMPLANT
BLADE SURG 15 STRL LF DISP TIS (BLADE) ×10 IMPLANT
BLADE SURG 15 STRL SS (BLADE) ×15
BUR CROSS CUT FISSURE 1.6 (BURR) IMPLANT
BUR EGG ELITE 4.0 (BURR) ×3 IMPLANT
BUR RND FLUTED 2.5 (BURR) IMPLANT
CANISTER SUCTION 2500CC (MISCELLANEOUS) ×3 IMPLANT
CLEANER TIP ELECTROSURG 2X2 (MISCELLANEOUS) ×3 IMPLANT
COVER SURGICAL LIGHT HANDLE (MISCELLANEOUS) ×3 IMPLANT
CRADLE DONUT ADULT HEAD (MISCELLANEOUS) ×3 IMPLANT
ELECT COATED BLADE 2.86 ST (ELECTRODE) ×3 IMPLANT
ELECT NEEDLE BLADE 2-5/6 (NEEDLE) IMPLANT
ELECT REM PT RETURN 9FT ADLT (ELECTROSURGICAL) ×3
ELECTRODE REM PT RTRN 9FT ADLT (ELECTROSURGICAL) ×2 IMPLANT
GAUZE PACKING FOLDED 2  STR (GAUZE/BANDAGES/DRESSINGS) ×3
GAUZE PACKING FOLDED 2 STR (GAUZE/BANDAGES/DRESSINGS) ×2 IMPLANT
GAUZE SPONGE 4X4 12PLY STRL (GAUZE/BANDAGES/DRESSINGS) IMPLANT
GAUZE SPONGE 4X4 16PLY XRAY LF (GAUZE/BANDAGES/DRESSINGS) ×3 IMPLANT
GLOVE BIO SURGEON STRL SZ 6 (GLOVE) ×3 IMPLANT
GLOVE BIO SURGEON STRL SZ 6.5 (GLOVE) IMPLANT
GLOVE BIO SURGEON STRL SZ7 (GLOVE) IMPLANT
GLOVE BIO SURGEON STRL SZ7.5 (GLOVE) ×3 IMPLANT
GLOVE BIOGEL PI IND STRL 6 (GLOVE) ×2 IMPLANT
GLOVE BIOGEL PI IND STRL 6.5 (GLOVE) IMPLANT
GLOVE BIOGEL PI IND STRL 7.0 (GLOVE) ×2 IMPLANT
GLOVE BIOGEL PI IND STRL 7.5 (GLOVE) ×4 IMPLANT
GLOVE BIOGEL PI INDICATOR 6 (GLOVE) ×1
GLOVE BIOGEL PI INDICATOR 6.5 (GLOVE)
GLOVE BIOGEL PI INDICATOR 7.0 (GLOVE) ×1
GLOVE BIOGEL PI INDICATOR 7.5 (GLOVE) ×2
GLOVE ORTHO TXT STRL SZ7.5 (GLOVE) ×3 IMPLANT
GOWN STRL REUS W/ TWL LRG LVL3 (GOWN DISPOSABLE) ×4 IMPLANT
GOWN STRL REUS W/TWL LRG LVL3 (GOWN DISPOSABLE) ×6
KIT BASIN OR (CUSTOM PROCEDURE TRAY) ×3 IMPLANT
KIT ROOM TURNOVER OR (KITS) ×3 IMPLANT
NEEDLE BLUNT 16X1.5 OR ONLY (NEEDLE) ×6 IMPLANT
NEEDLE DENTAL 27 LONG (NEEDLE) ×6 IMPLANT
NEEDLE HYPO 25GX1X1/2 BEV (NEEDLE) ×3 IMPLANT
NS IRRIG 1000ML POUR BTL (IV SOLUTION) ×3 IMPLANT
PACK EENT II TURBAN DRAPE (CUSTOM PROCEDURE TRAY) ×3 IMPLANT
PAD ARMBOARD 7.5X6 YLW CONV (MISCELLANEOUS) ×6 IMPLANT
PENCIL BUTTON HOLSTER BLD 10FT (ELECTRODE) ×3 IMPLANT
SOLUTION BETADINE 4OZ (MISCELLANEOUS) ×3 IMPLANT
SPONGE GAUZE 4X4 12PLY STER LF (GAUZE/BANDAGES/DRESSINGS) ×3 IMPLANT
SUT CHROMIC 3 0 PS 2 (SUTURE) ×9 IMPLANT
SYR 50ML SLIP (SYRINGE) ×6 IMPLANT
SYR BULB IRRIGATION 50ML (SYRINGE) ×3 IMPLANT
SYR CONTROL 10ML LL (SYRINGE) ×3 IMPLANT
TOOTHBRUSH ADULT (PERSONAL CARE ITEMS) ×3 IMPLANT
TOWEL OR 17X24 6PK STRL BLUE (TOWEL DISPOSABLE) ×3 IMPLANT
TOWEL OR 17X26 10 PK STRL BLUE (TOWEL DISPOSABLE) IMPLANT
TUBE CONNECTING 12X1/4 (SUCTIONS) ×3 IMPLANT
WATER STERILE IRR 1000ML POUR (IV SOLUTION) ×3 IMPLANT

## 2015-06-20 NOTE — Discharge Instructions (Signed)
HOME CARE INSTRUCTIONS DENTAL PROCEDURES  MEDICATION: Some soreness and discomfort is normal following a dental procedure.  Use of a non-aspirin pain product, like acetaminophen, is recommended.  If pain is not relieved, please call the surgeon who performed the procedure.  ORAL HYGIENE: Brushing of the teeth should be resumed the day after surgery.  Begin slowly and softly.  In children, brushing should be done by the parent after every meal.  DIET: A balanced diet is very important during the healing process.   Liquids and soft foods are advisable.  Drink clear liquids at first, then progress to other liquids as tolerated.  If teeth were removed, do not use a straw for at least 2 days.  Try to limit between-meal snacks which are high in sugar.  ACTIVITY: Limit to quiet indoor activities for 24 hours following surgery.  RETURN TO SCHOOL OR WORK: You may return to school or work in a day or two, or as indicated by Designer, industrial/product.  GENERAL EXPECTATIONS:  -Bleeding is to be expected after teeth are removed.  The bleeding should slow down after several hours.  -Stitches may be in place, which will fall out by themselves.  CALL YOUR DOCTOR IS THESE OCCUR:  -Temperature is 101 degrees or more.  -Persistent bright red bleeding.  -Severe pain.  Return to the doctor's office as needed. Call to make an appointment.  Patient Signature:  ________________________________________________________  Nurse's Signature:  ________________________________________________________

## 2015-06-20 NOTE — Anesthesia Postprocedure Evaluation (Signed)
  Anesthesia Post-op Note  Patient: Michelle Conley  Procedure(s) Performed: Procedure(s): EXTRACTION  OF TEETH NUMBERS 2-15, 19-30,ALVEOLOPLASTY OF MAXILLA  AND MANDIBLE (N/A) UPPER LABIAL L FRENECTOMY (N/A)  Patient Location: PACU  Anesthesia Type:General  Level of Consciousness: awake  Airway and Oxygen Therapy: Patient Spontanous Breathing  Post-op Pain: mild  Post-op Assessment: Post-op Vital signs reviewed              Post-op Vital Signs: Reviewed  Last Vitals:  Filed Vitals:   06/20/15 1000  BP: 134/54  Pulse: 82  Temp: 36.8 C  Resp:     Complications: No apparent anesthesia complications

## 2015-06-20 NOTE — Progress Notes (Signed)
Iv infusing on arrival to pacu 

## 2015-06-20 NOTE — Interval H&P Note (Signed)
History and Physical Interval Note:  06/20/2015 7:29 AM  Michelle Conley  has presented today for surgery, with the diagnosis of DENTAL CARIES ON SMOOTH SERVICE,UNSPECIFIED ANOMALY, UNSPECIFIED ATROPHY OF EDENTULOUS ALVEOLAR RIDGE  The various methods of treatment have been discussed with the patient and family. After consideration of risks, benefits and other options for treatment, the patient has consented to  PROCEDURE:  EXTRACTION OF ALL REMAINING TEETH AND FOUR QUADRANTS OF ALVEOLOPLASTY   as a surgical intervention .  The patient's history has been reviewed, patient examined, no change in status, stable for surgery.  I have reviewed the patient's chart and labs.  Questions were answered to the patient's satisfaction.     [place],Michelle Conley

## 2015-06-20 NOTE — Anesthesia Procedure Notes (Signed)
Procedure Name: Intubation Date/Time: 06/20/2015 7:48 AM Performed by: Fransisca Kaufmann Pre-anesthesia Checklist: Patient identified, Emergency Drugs available, Suction available, Patient being monitored and Timeout performed Patient Re-evaluated:Patient Re-evaluated prior to inductionOxygen Delivery Method: Circle system utilized Preoxygenation: Pre-oxygenation with 100% oxygen Intubation Type: IV induction Ventilation: Mask ventilation without difficulty Laryngoscope Size: Mac and 3 Grade View: Grade I Nasal Tubes: Nasal Rae Tube size: 7.0 mm Number of attempts: 1 Placement Confirmation: ETT inserted through vocal cords under direct vision,  positive ETCO2 and breath sounds checked- equal and bilateral Tube secured with: Tape Dental Injury: Teeth and Oropharynx as per pre-operative assessment  Comments: Nasal spray pre-induction/ NPA 7.0/7.5/ # 7.0 Nasal rae with ease

## 2015-06-20 NOTE — Transfer of Care (Signed)
Immediate Anesthesia Transfer of Care Note  Patient: Michelle Conley  Procedure(s) Performed: Procedure(s): EXTRACTION  OF TEETH NUMBERS 2-15, 19-30,ALVEOLOPLASTY OF MAXILLA  AND MANDIBLE (N/A) UPPER LABIAL L FRENECTOMY (N/A)  Patient Location: PACU  Anesthesia Type:General  Level of Consciousness: awake, alert , oriented and sedated  Airway & Oxygen Therapy: Patient Spontanous Breathing and Patient connected to nasal cannula oxygen  Post-op Assessment: Report given to RN, Post -op Vital signs reviewed and stable and Patient moving all extremities  Post vital signs: Reviewed and stable  Last Vitals:  Filed Vitals:   06/20/15 0606  BP: 138/57  Pulse: 67  Temp: 36.5 C  Resp: 18    Complications: No apparent anesthesia complications

## 2015-06-20 NOTE — Anesthesia Preprocedure Evaluation (Addendum)
Anesthesia Evaluation  Patient identified by MRN, date of birth, ID band Patient awake    Airway Mallampati: II  TM Distance: >3 FB Neck ROM: Full    Dental   Pulmonary shortness of breath, asthma , Current Smoker,  breath sounds clear to auscultation        Cardiovascular hypertension, Rhythm:Regular Rate:Normal     Neuro/Psych    GI/Hepatic   Endo/Other  Hypothyroidism   Renal/GU      Musculoskeletal   Abdominal   Peds  Hematology   Anesthesia Other Findings   Reproductive/Obstetrics                            Anesthesia Physical Anesthesia Plan  ASA: III  Anesthesia Plan: General   Post-op Pain Management:    Induction: Intravenous  Airway Management Planned: Nasal ETT  Additional Equipment:   Intra-op Plan:   Post-operative Plan: Extubation in OR  Informed Consent: I have reviewed the patients History and Physical, chart, labs and discussed the procedure including the risks, benefits and alternatives for the proposed anesthesia with the patient or authorized representative who has indicated his/her understanding and acceptance.   Dental advisory given  Plan Discussed with: CRNA and Anesthesiologist  Anesthesia Plan Comments:         Anesthesia Quick Evaluation

## 2015-06-20 NOTE — Op Note (Addendum)
06/20/2015  9:12 AM  PATIENT:  Michelle Conley  44 y.o. female  PRE-OPERATIVE DIAGNOSIS:  RAMPANT DENTAL CARIES INVOLVING DENTIN, IRREGULAR ALVEOLAR BONE   POST-OPERATIVE DIAGNOSIS:  RAMPANT DENTAL CARIES INVOLVING DENTIN, IRREGULAR ALVEOLAR BONE, HYPERTROPHY OF MAXILLARY LABIAL FRENUM  PROCEDURE:  Procedure(s): EXTRACTION OF TEETH #2, 3, 4, 5, 6, 7, 8, 9, 10, 11, 12, 13, 14, 15 EXTRACTION OF TEETH #19, 20, 21, 22, 23, 24, 25, 26, 27, 28, 29 ALVEOLOPLASTY OF THE BILATERAL MAXILLA AND MANDIBLE  INDICATIONS FOR PROCEDURE: Ms. Daughety is a 44 year old female that was referred by her general dentist for the extraction of all her remaining teeth in preparation for a denture. 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 Given her history and severe anxiety she will require having her surgery performed in an Operating Room setting under general anesthesia.  SURGEON:  Surgeon(s): [place], DDS  PHYSICIAN ASSISTANT: NONE  ASSISTANTS: none   ANESTHESIA:   general   PROCEDURE IN DETAIL: The patient was seen in the preoperative area. All questions were answered and the history and physical was updated and verified.  The consent was reviewed and signed.  The patient was taken to the operating room by the anesthesia service.   Patient was placed on the table in the supine position and nasally intubated. The patient was prepped and draped in the usual sterile fashion for all maxillofacial surgery procedures.  A moisten raytec was placed in the patient oropharynx.  Eight carpules of 2% Lidocaine with 1:100,000 epinephrine was then placed: Bilateral Long Buccal, Bilateral Inferior Alveolar, Bilateral Greater Palatine Nerves were all blocked.  Also, bilateral infiltrations of the buccal maxillary vestibule was performed.     Next, a 15 blade was used to make a full thickness  mucoperiosteal flap along the sulcus of teeth #'s 19 to 29  with distobuccal releases this was also repeated for teeth #'s 2 through 15. Note, the maxillary frenum was hypertrophic and would negatively affect the patient's ability to wear her future denture.  I included the maxillary frenum in my incision.  Next, a periosteal elevator was used to raise the flap.  Hand instruments were used to remove bone and elevate the teeth.  Forceps were used to deliver the teeth without complication.  Next, a rongeur and a bone file was used to perform alveoloplasty along the maxillary and mandibular alveolar ridge.  Copious irrigation with normal saline was performed and then 3.0 chromic running suture was used to close the wound in the maxilla and mandible.  The wound was hemostatic.  All counts were correct times two.  Patient was extubated and taken to the PACU were she recovered well.   EBL:  Total I/O In: 1000 [I.V.:1000] Out: -   DRAINS: none   LOCAL MEDICATIONS USED: 0.5% MARCAINE with 1:200,000 epinephrine  And  2% LIDOCAINE with 1:100,000 epinephrine  SPECIMEN:  No Specimen  DISPOSITION OF SPECIMEN:  N/A  COUNTS:  YES  PLAN OF CARE: Discharge to home after PACU  PATIENT DISPOSITION:  PACU - hemodynamically stable.   Delay start of Pharmacological VTE agent (>24hrs) due to surgical blood loss or risk of bleeding:  not applicableD

## 2015-06-21 ENCOUNTER — Encounter (HOSPITAL_COMMUNITY): Payer: Self-pay | Admitting: Oral and Maxillofacial Surgery

## 2016-11-23 ENCOUNTER — Encounter: Payer: Self-pay | Admitting: Emergency Medicine

## 2016-11-23 ENCOUNTER — Emergency Department
Admission: EM | Admit: 2016-11-23 | Discharge: 2016-11-23 | Disposition: A | Discharge status: Home / Self Care | Payer: Medicaid Other | Attending: Emergency Medicine | Admitting: Emergency Medicine

## 2016-11-23 DIAGNOSIS — E039 Hypothyroidism, unspecified: Secondary | ICD-10-CM | POA: Diagnosis not present

## 2016-11-23 DIAGNOSIS — Z79899 Other long term (current) drug therapy: Secondary | ICD-10-CM | POA: Insufficient documentation

## 2016-11-23 DIAGNOSIS — F17200 Nicotine dependence, unspecified, uncomplicated: Secondary | ICD-10-CM | POA: Diagnosis not present

## 2016-11-23 DIAGNOSIS — J45909 Unspecified asthma, uncomplicated: Secondary | ICD-10-CM | POA: Insufficient documentation

## 2016-11-23 DIAGNOSIS — G43909 Migraine, unspecified, not intractable, without status migrainosus: Secondary | ICD-10-CM | POA: Diagnosis present

## 2016-11-23 MED ORDER — METOCLOPRAMIDE HCL 5 MG/ML IJ SOLN
10.0000 mg | Freq: Once | INTRAMUSCULAR | Status: AC
  Administered 2016-11-23: 10 mg via INTRAVENOUS
  Filled 2016-11-23: qty 2

## 2016-11-23 MED ORDER — DIPHENHYDRAMINE HCL 50 MG/ML IJ SOLN
12.5000 mg | Freq: Once | INTRAMUSCULAR | Status: AC
  Administered 2016-11-23: 12.5 mg via INTRAVENOUS
  Filled 2016-11-23: qty 1

## 2016-11-23 MED ORDER — SODIUM CHLORIDE 0.9 % IV BOLUS (SEPSIS)
1000.0000 mL | Freq: Once | INTRAVENOUS | Status: AC
  Administered 2016-11-23: 1000 mL via INTRAVENOUS

## 2016-11-23 MED ORDER — KETOROLAC TROMETHAMINE 30 MG/ML IJ SOLN
30.0000 mg | Freq: Once | INTRAMUSCULAR | Status: AC
  Administered 2016-11-23: 30 mg via INTRAVENOUS
  Filled 2016-11-23: qty 1

## 2016-11-23 NOTE — ED Provider Notes (Signed)
[facility] Emergency Department Provider Note  ____________________________________________  Time seen: Approximately 12:23 PM  I have reviewed the triage vital signs and the nursing notes.   HISTORY  Chief Complaint Migraine    HPI Michelle Conley is a 46 y.o. female , NAD, presents to the emergency department with 1 week history of migraine and cluster headache. States she has history of the same. Usually take ibuprofen which will alleviate the headache but that has not worked for her this week. Is associated with nausea and vomiting but denies abdominal pain or diarrhea.  Headache is not the worst headache of her life and it did not have a thunderclap presentation. Has not woken her from sleep. Denies changes in speech or gait. Has had no fevers, chills, body aches, sinus pressure, ear pressure, visual changes, numbness, weakness or tingling. No chest pain, shortness of breath. Has had no injury or trauma to cause the symptoms. Is typically managed by her PCP at [facility] but was unable to get an appointment due to inclement weather conditions.    Past Medical History:  Diagnosis Date  . Abnormal chest xray    Reports that she has spots on her lungs  . Anemia   . Anxiety   . Aortic valve disorder   . Aortic valve insufficiency   . Asthma   . Cervical dysplasia   . Complication of anesthesia    reports that she comes out of it slowly  . Cranial nerve palsy   . Depression   . GERD (gastroesophageal reflux disease)   . Goiter   . History of kidney stones   . History of meningitis   . History of viral encephalitis   . Hypertension   . Hypothyroidism   . Migraine headache   . Post traumatic stress disorder (PTSD)   . Scarlet fever   . Shortness of breath dyspnea   . Stroke (HCC)   . Syncope and collapse     There are no active problems to display for this patient.   Past Surgical History:  Procedure Laterality Date  . CESAREAN  SECTION  1997  . CHOLECYSTECTOMY  2015  . LINGUAL FRENECTOMY N/A 06/20/2015   Procedure: UPPER LABIAL L FRENECTOMY;  Surgeon: Lincoln Brighamhristopher [place], DDS;  Location: [facility] OR;  Service: Oral Surgery;  Laterality: N/A;  . MULTIPLE EXTRACTIONS WITH ALVEOLOPLASTY N/A 06/20/2015   Procedure: EXTRACTION  OF TEETH NUMBERS 2-15, 19-30,ALVEOLOPLASTY OF MAXILLA  AND MANDIBLE;  Surgeon: Lincoln Brighamhristopher [place], DDS;  Location: [facility] OR;  Service: Oral Surgery;  Laterality: N/A;  . NASAL SEPTUM SURGERY      Prior to Admission medications   Medication Sig Start Date End Date Taking? Authorizing Provider  albuterol (PROVENTIL HFA;VENTOLIN HFA) 108 (90 BASE) MCG/ACT inhaler Inhale 1 puff into the lungs every 6 (six) hours as needed for wheezing or shortness of breath.    Historical Provider, MD  alprazolam Prudy Feeler(XANAX) 2 MG tablet Take 2 mg by mouth 4 (four) times daily.    Historical Provider, MD  beclomethasone (QVAR) 80 MCG/ACT inhaler Inhale 2 puffs into the lungs every 6 (six) hours as needed (wheezing or shortness of breath).    Historical Provider, MD  EPINEPHrine (EPIPEN 2-PAK) 0.3 mg/0.3 mL IJ SOAJ injection Inject 0.3 mg into the muscle once as needed (allergic reaction).    Historical Provider, MD  escitalopram (LEXAPRO) 20 MG tablet Take 20 mg by mouth daily.    Historical Provider, MD  ibuprofen (ADVIL,MOTRIN) 800 MG  tablet Take 800 mg by mouth every 8 (eight) hours as needed for mild pain or moderate pain.    Historical Provider, MD  levothyroxine (SYNTHROID, LEVOTHROID) 300 MCG tablet Take 300 mcg by mouth daily.    Historical Provider, MD  omeprazole (PRILOSEC) 40 MG capsule Take 40 mg by mouth 2 (two) times daily.    Historical Provider, MD  simvastatin (ZOCOR) 20 MG tablet Take 20 mg by mouth every evening.    Historical Provider, MD  tiotropium (SPIRIVA) 18 MCG inhalation capsule Place 18 mcg into inhaler and inhale daily.     Historical Provider, MD  traZODone (DESYREL) 100 MG tablet Take 100 mg by mouth at  bedtime as needed for sleep.    Historical Provider, MD    Allergies Cyclobenzaprine; Esomeprazole magnesium; Metronidazole; Mobic [meloxicam]; Rofecoxib; Tramadol; and Penicillins  No family history on file.  Social History Social History  Substance Use Topics  . Smoking status: Current Every Day Smoker    Packs/day: 1.00    Years: 30.00  . Smokeless tobacco: Never Used  . Alcohol use No     Review of Systems  Constitutional: No fever/chills Eyes: No visual changes.  Cardiovascular: No chest pain. Respiratory: No shortness of breath.  Gastrointestinal: Positive nausea, vomiting. No abdominal pain. No diarrhea.  Musculoskeletal: Negative for general myalgias. No neck pain.  Skin: Negative for rash. Neurological: Positive for headaches, but no focal weakness or numbness. No LOC, lightheadedness, dizziness. No changes in speech or gait.  10-point ROS otherwise negative.  ____________________________________________   PHYSICAL EXAM:  VITAL SIGNS: ED Triage Vitals [11/23/16 1138]  Enc Vitals Group     BP (!) 160/64     Pulse Rate 84     Resp 20     Temp 97.7 F (36.5 C)     Temp Source Oral     SpO2 97 %     Weight 140 lb (63.5 kg)     Height 5\' 6"  (1.676 m)     Head Circumference      Peak Flow      Pain Score 10     Pain Loc      Pain Edu?      Excl. in GC?      Constitutional: Alert and oriented. Well appearing and in no acute distress, but in pain. Eyes: Conjunctivae are normal. PERRL. EOMI without pain.  Head: Atraumatic. Neck: Supple with FROM.  Hematological/Lymphatic/Immunilogical: No cervical lymphadenopathy. Cardiovascular: Normal rate, regular rhythm. Normal S1 and S2.  Good peripheral circulation. Respiratory: Normal respiratory effort without tachypnea or retractions. Lungs CTAB with breath sounds noted in all lung fields. No wheeze, rhonchi or rales Musculoskeletal: No lower extremity tenderness nor edema.  No joint effusions. Neurologic:   Normal speech and language. Normal gait and posture. No gross focal neurologic deficits are appreciated.  Skin:  Skin is warm, dry and intact. No rash noted. Psychiatric: Mood and affect are normal. Speech and behavior are normal. Patient exhibits appropriate insight and judgement.   ____________________________________________   LABS  None ____________________________________________  EKG  None ____________________________________________  RADIOLOGY  None ____________________________________________    PROCEDURES  Procedure(s) performed: None   Procedures   Medications  sodium chloride 0.9 % bolus 1,000 mL (0 mLs Intravenous Stopped 11/23/16 1517)  ketorolac (TORADOL) 30 MG/ML injection 30 mg (30 mg Intravenous Given 11/23/16 1324)  metoCLOPramide (REGLAN) injection 10 mg (10 mg Intravenous Given 11/23/16 1325)  diphenhydrAMINE (BENADRYL) injection 12.5 mg (12.5 mg Intravenous Given 11/23/16 1326)  ____________________________________________   INITIAL IMPRESSION / ASSESSMENT AND PLAN / ED COURSE  Pertinent labs & imaging results that were available during my care of the patient were reviewed by me and considered in my medical decision making (see chart for details).  Clinical Course as of Nov 23 1637  Fri Nov 23, 2016  1412 Patient notes mild improvement of headache pain since being given IV medications. Will monitor for another 30 minutes and reassessed.  [JH]  1516 Patient notes continued improvement of headache pain and no further nausea. Patient is ready for discharge. Discussed that she will need follow-up with her primary care provider or neurology if headache persists.  [JH]    Clinical Course User Index [JH] Jami L Hagler, PA-C    Patient's diagnosis is consistent with Migraine without status migrainosus that is not intractable. Patient will be discharged home with instructions to avoid any other over-the-counter medications at this time. Patient is  to follow up with her primary care provider or neurology if symptoms persist past this treatment course. Patient is given ED precautions to return to the ED for any worsening or new symptoms.    ____________________________________________  FINAL CLINICAL IMPRESSION(S) / ED DIAGNOSES  Final diagnoses:  Migraine without status migrainosus, not intractable, unspecified migraine type      NEW MEDICATIONS STARTED DURING THIS VISIT:  Discharge Medication List as of 11/23/2016  2:59 PM           Hope Pigeon, PA-C 11/23/16 1639    Sharyn Creamer, MD 11/24/16 2337

## 2016-11-23 NOTE — ED Triage Notes (Signed)
Pt with migraine x1 week; pt ambulatory to triage desk, eating crackers and drinking mtn dew in lobby.

## 2016-11-23 NOTE — ED Notes (Signed)
See triage note  States she has had a headache for 5-6 days   Pain is mainly behind eye and states her vision is blurred  Was started on mobic for orthro  Injury and headaches became worse

## 2016-11-24 ENCOUNTER — Emergency Department: Payer: Medicaid Other

## 2016-11-24 ENCOUNTER — Emergency Department
Admission: EM | Admit: 2016-11-24 | Discharge: 2016-11-24 | Disposition: A | Discharge status: Home / Self Care | Payer: Medicaid Other | Attending: Student in an Organized Health Care Education/Training Program | Admitting: Student in an Organized Health Care Education/Training Program

## 2016-11-24 DIAGNOSIS — R519 Headache, unspecified: Secondary | ICD-10-CM

## 2016-11-24 DIAGNOSIS — Z791 Long term (current) use of non-steroidal anti-inflammatories (NSAID): Secondary | ICD-10-CM | POA: Diagnosis not present

## 2016-11-24 DIAGNOSIS — Z79899 Other long term (current) drug therapy: Secondary | ICD-10-CM | POA: Insufficient documentation

## 2016-11-24 DIAGNOSIS — J45909 Unspecified asthma, uncomplicated: Secondary | ICD-10-CM | POA: Diagnosis not present

## 2016-11-24 DIAGNOSIS — F17200 Nicotine dependence, unspecified, uncomplicated: Secondary | ICD-10-CM | POA: Diagnosis not present

## 2016-11-24 DIAGNOSIS — R110 Nausea: Secondary | ICD-10-CM

## 2016-11-24 DIAGNOSIS — I10 Essential (primary) hypertension: Secondary | ICD-10-CM | POA: Insufficient documentation

## 2016-11-24 DIAGNOSIS — R51 Headache: Secondary | ICD-10-CM | POA: Diagnosis present

## 2016-11-24 DIAGNOSIS — E039 Hypothyroidism, unspecified: Secondary | ICD-10-CM | POA: Insufficient documentation

## 2016-11-24 LAB — COMPREHENSIVE METABOLIC PANEL
ALT: 13 U/L — ABNORMAL LOW (ref 14–54)
AST: 18 U/L (ref 15–41)
Albumin: 4.2 g/dL (ref 3.5–5.0)
Alkaline Phosphatase: 95 U/L (ref 38–126)
Anion gap: 8 (ref 5–15)
BUN: 8 mg/dL (ref 6–20)
CO2: 22 mmol/L (ref 22–32)
Calcium: 9.3 mg/dL (ref 8.9–10.3)
Chloride: 108 mmol/L (ref 101–111)
Creatinine, Ser: 0.57 mg/dL (ref 0.44–1.00)
GFR calc Af Amer: >60 mL/min (ref >60)
GFR calc non Af Amer: >60 mL/min (ref >60)
Glucose, Bld: 87 mg/dL (ref 65–99)
Potassium: 4 mmol/L (ref 3.5–5.1)
Sodium: 138 mmol/L (ref 135–145)
Total Bilirubin: 0.4 mg/dL (ref 0.3–1.2)
Total Protein: 7.5 g/dL (ref 6.5–8.1)

## 2016-11-24 LAB — CBC WITH DIFFERENTIAL/PLATELET
Basophils Absolute: 0 10*3/uL (ref 0–0.1)
Basophils Relative: 0 %
Eosinophils Absolute: 0.1 10*3/uL (ref 0–0.7)
Eosinophils Relative: 1 %
HCT: 42.4 % (ref 35.0–47.0)
Hemoglobin: 14.6 g/dL (ref 12.0–16.0)
Lymphocytes Relative: 24 %
Lymphs Abs: 2.7 10*3/uL (ref 1.0–3.6)
MCH: 33 pg (ref 26.0–34.0)
MCHC: 34.4 g/dL (ref 32.0–36.0)
MCV: 95.9 fL (ref 80.0–100.0)
Monocytes Absolute: 0.6 10*3/uL (ref 0.2–0.9)
Monocytes Relative: 5 %
Neutro Abs: 7.7 10*3/uL — ABNORMAL HIGH (ref 1.4–6.5)
Neutrophils Relative %: 70 %
Platelets: 271 10*3/uL (ref 150–440)
RBC: 4.42 MIL/uL (ref 3.80–5.20)
RDW: 14.4 % (ref 11.5–14.5)
WBC: 11.1 10*3/uL — ABNORMAL HIGH (ref 3.6–11.0)

## 2016-11-24 MED ORDER — PROMETHAZINE HCL 12.5 MG PO TABS: 12.5000 mg | ORAL_TABLET | Freq: Four times a day (QID) | ORAL | 0 refills | Status: DC | PRN

## 2016-11-24 MED ORDER — PROCHLORPERAZINE EDISYLATE 5 MG/ML IJ SOLN
10.0000 mg | Freq: Once | INTRAMUSCULAR | Status: AC
  Administered 2016-11-24: 10 mg via INTRAVENOUS
  Filled 2016-11-24: qty 2

## 2016-11-24 MED ORDER — ESCITALOPRAM OXALATE 10 MG PO TABS
20.0000 mg | ORAL_TABLET | Freq: Once | ORAL | Status: AC
  Administered 2016-11-24: 20 mg via ORAL
  Filled 2016-11-24: qty 2

## 2016-11-24 MED ORDER — DIPHENHYDRAMINE HCL 50 MG/ML IJ SOLN
25.0000 mg | Freq: Once | INTRAMUSCULAR | Status: AC
  Administered 2016-11-24: 25 mg via INTRAVENOUS
  Filled 2016-11-24: qty 1

## 2016-11-24 MED ORDER — DEXAMETHASONE SODIUM PHOSPHATE 10 MG/ML IJ SOLN
10.0000 mg | Freq: Once | INTRAMUSCULAR | Status: AC
Start: 2016-11-24 — End: 2016-11-24
  Administered 2016-11-24: 10 mg via INTRAVENOUS
  Filled 2016-11-24: qty 1

## 2016-11-24 MED ORDER — LEVOTHYROXINE SODIUM 50 MCG PO TABS
150.0000 ug | ORAL_TABLET | ORAL | Status: AC
  Administered 2016-11-24: 150 ug via ORAL
  Filled 2016-11-24: qty 3

## 2016-11-24 MED ORDER — METHOCARBAMOL 500 MG PO TABS
500.0000 mg | ORAL_TABLET | Freq: Once | ORAL | Status: AC
  Administered 2016-11-24: 500 mg via ORAL
  Filled 2016-11-24: qty 1

## 2016-11-24 MED ORDER — MAGNESIUM SULFATE 2 GM/50ML IV SOLN
2.0000 g | Freq: Once | INTRAVENOUS | Status: AC
  Administered 2016-11-24: 2 g via INTRAVENOUS
  Filled 2016-11-24: qty 50

## 2016-11-24 MED ORDER — SODIUM CHLORIDE 0.9 % IV BOLUS (SEPSIS)
1000.0000 mL | Freq: Once | INTRAVENOUS | Status: AC
  Administered 2016-11-24: 1000 mL via INTRAVENOUS

## 2016-11-24 NOTE — ED Notes (Signed)
Patient discharged from hallway bed. No computer available to have patient e-sign document. Acknowledges and voices understanding of discharge instructions and followup instructions.

## 2016-11-24 NOTE — ED Triage Notes (Addendum)
Pt arrives to ER via POV c/o vision changes, burning in eyes and double vision since yesterday. PT seen in ER yesterday and dx with migraine. Pt eyes crossing at time of triage, pt states that this is not normal.

## 2016-11-24 NOTE — ED Notes (Signed)
Pt calling for ride - discharge papers are ready

## 2016-11-24 NOTE — ED Notes (Signed)
Spoke with Dr. Lenard LancePaduchowski regarding pt sx. Orders placed.

## 2016-11-24 NOTE — ED Provider Notes (Signed)
[facility] Emergency Department Provider Note    First MD Initiated Contact with Patient 11/24/16 1631     (approximate)  I have reviewed the triage vital signs and the nursing notes.   HISTORY  Chief Complaint Loss of Vision    HPI JACQUALYN SEDGWICK is a 46 y.o. female with a history of migraine, [place] policy and TIA presents with persistent headache and blurry vision that occurred after being seen yesterday. Patient was given migraine cocktail with improvement in symptoms and discharged home. States that she woke up this morning with persistent symptoms, nausea and general body aches and vision changes. States that she's having burning in her eyes and double vision.  States that she's been suffering from the flu for the past 2 months. Denies any fevers. No chills. States she feels nauseated and is unable to take in her home medications.   Past Medical History:  Diagnosis Date  . Abnormal chest xray    Reports that she has spots on her lungs  . Anemia   . Anxiety   . Aortic valve disorder   . Aortic valve insufficiency   . Asthma   . Cervical dysplasia   . Complication of anesthesia    reports that she comes out of it slowly  . Cranial nerve palsy   . Depression   . GERD (gastroesophageal reflux disease)   . Goiter   . History of kidney stones   . History of meningitis   . History of viral encephalitis   . Hypertension   . Hypothyroidism   . Migraine headache   . Post traumatic stress disorder (PTSD)   . Scarlet fever   . Shortness of breath dyspnea   . Stroke (HCC)   . Syncope and collapse    No family history on file. Past Surgical History:  Procedure Laterality Date  . CESAREAN SECTION  1997  . CHOLECYSTECTOMY  2015  . LINGUAL FRENECTOMY N/A 06/20/2015   Procedure: UPPER LABIAL L FRENECTOMY;  Surgeon: Lincoln Brigham, DDS;  Location: [facility] OR;  Service: Oral Surgery;  Laterality: N/A;  . MULTIPLE EXTRACTIONS WITH ALVEOLOPLASTY N/A  06/20/2015   Procedure: EXTRACTION  OF TEETH NUMBERS 2-15, 19-30,ALVEOLOPLASTY OF MAXILLA  AND MANDIBLE;  Surgeon: Lincoln Brigham, DDS;  Location: [facility] OR;  Service: Oral Surgery;  Laterality: N/A;  . NASAL SEPTUM SURGERY     There are no active problems to display for this patient.     Prior to Admission medications   Medication Sig Start Date End Date Taking? Authorizing Provider  albuterol (PROVENTIL HFA;VENTOLIN HFA) 108 (90 BASE) MCG/ACT inhaler Inhale 1 puff into the lungs every 6 (six) hours as needed for wheezing or shortness of breath.    Historical Provider, MD  alprazolam Prudy Feeler) 2 MG tablet Take 2 mg by mouth 4 (four) times daily.    Historical Provider, MD  beclomethasone (QVAR) 80 MCG/ACT inhaler Inhale 2 puffs into the lungs every 6 (six) hours as needed (wheezing or shortness of breath).    Historical Provider, MD  EPINEPHrine (EPIPEN 2-PAK) 0.3 mg/0.3 mL IJ SOAJ injection Inject 0.3 mg into the muscle once as needed (allergic reaction).    Historical Provider, MD  escitalopram (LEXAPRO) 20 MG tablet Take 20 mg by mouth daily.    Historical Provider, MD  ibuprofen (ADVIL,MOTRIN) 800 MG tablet Take 800 mg by mouth every 8 (eight) hours as needed for mild pain or moderate pain.    Historical Provider, MD  levothyroxine (SYNTHROID,  LEVOTHROID) 300 MCG tablet Take 300 mcg by mouth daily.    Historical Provider, MD  omeprazole (PRILOSEC) 40 MG capsule Take 40 mg by mouth 2 (two) times daily.    Historical Provider, MD  promethazine (PHENERGAN) 12.5 MG tablet Take 1 tablet (12.5 mg total) by mouth every 6 (six) hours as needed for nausea or vomiting. 11/24/16   Willy Eddy, MD  simvastatin (ZOCOR) 20 MG tablet Take 20 mg by mouth every evening.    Historical Provider, MD  tiotropium (SPIRIVA) 18 MCG inhalation capsule Place 18 mcg into inhaler and inhale daily.     Historical Provider, MD  traZODone (DESYREL) 100 MG tablet Take 100 mg by mouth at bedtime as needed for sleep.     Historical Provider, MD    Allergies Cyclobenzaprine; Esomeprazole magnesium; Metronidazole; Mobic [meloxicam]; Rofecoxib; Tramadol; and Penicillins    Social History Social History  Substance Use Topics  . Smoking status: Current Every Day Smoker    Packs/day: 1.00    Years: 30.00  . Smokeless tobacco: Never Used  . Alcohol use No    Review of Systems Patient denies headaches, rhinorrhea, blurry vision, numbness, shortness of breath, chest pain, edema, cough, abdominal pain, nausea, vomiting, diarrhea, dysuria, fevers, rashes or hallucinations unless otherwise stated above in HPI. ____________________________________________   PHYSICAL EXAM:  VITAL SIGNS: Vitals:   11/24/16 1429  BP: 129/80  Pulse: (!) 101  Resp: 18  Temp: 98.1 F (36.7 C)    Constitutional: Alert and oriented. in no acute distress. Eyes: Conjunctivae are normal. PERRL. EOMI.  Patient voluntarily crossing eyes deviating them but when taking her evaluating oculocephalic reflex patient has extraocular motions are intact with no evidence of exotropia muscle dry. Head: Atraumatic. Nose: No congestion/rhinnorhea. Mouth/Throat: Mucous membranes are moist.  Oropharynx non-erythematous. Neck: No stridor. Painless ROM. No cervical spine tenderness to palpation Hematological/Lymphatic/Immunilogical: No cervical lymphadenopathy. Cardiovascular: Normal rate, regular rhythm. Grossly normal heart sounds.  Good peripheral circulation. Respiratory: Normal respiratory effort.  No retractions. Lungs CTAB. Gastrointestinal: Soft and nontender. No distention. No abdominal bruits. No CVA tenderness. Musculoskeletal: No lower extremity tenderness nor edema.  No joint effusions. Neurologic:  CN- intact.  No facial droop, Normal FNF.  Normal heel to shin.  Sensation intact bilaterally. Normal speech and language. No gross focal neurologic deficits are appreciated. No gait instability. Skin:  Skin is warm, dry and intact. No  rash noted. Psychiatric: Mood and affect are normal. Speech and behavior are normal.  ____________________________________________   LABS (all labs ordered are listed, but only abnormal results are displayed)  Results for orders placed or performed during the hospital encounter of 11/24/16 (from the past 24 hour(s))  CBC with Differential/Platelet     Status: Abnormal   Collection Time: 11/24/16  5:11 PM  Result Value Ref Range   WBC 11.1 (H) 3.6 - 11.0 K/uL   RBC 4.42 3.80 - 5.20 MIL/uL   Hemoglobin 14.6 12.0 - 16.0 g/dL   HCT 16.1 09.6 - 04.5 %   MCV 95.9 80.0 - 100.0 fL   MCH 33.0 26.0 - 34.0 pg   MCHC 34.4 32.0 - 36.0 g/dL   RDW 40.9 81.1 - 91.4 %   Platelets 271 150 - 440 K/uL   Neutrophils Relative % 70 %   Lymphocytes Relative 24 %   Monocytes Relative 5 %   Eosinophils Relative 1 %   Basophils Relative 0 %   Neutro Abs 7.7 (H) 1.4 - 6.5 K/uL   Lymphs Abs 2.7  1.0 - 3.6 K/uL   Monocytes Absolute 0.6 0.2 - 0.9 K/uL   Eosinophils Absolute 0.1 0 - 0.7 K/uL   Basophils Absolute 0.0 0 - 0.1 K/uL   Smear Review MORPHOLOGY UNREMARKABLE   Comprehensive metabolic panel     Status: Abnormal   Collection Time: 11/24/16  5:11 PM  Result Value Ref Range   Sodium 138 135 - 145 mmol/L   Potassium 4.0 3.5 - 5.1 mmol/L   Chloride 108 101 - 111 mmol/L   CO2 22 22 - 32 mmol/L   Glucose, Bld 87 65 - 99 mg/dL   BUN 8 6 - 20 mg/dL   Creatinine, Ser 4.09 0.44 - 1.00 mg/dL   Calcium 9.3 8.9 - 81.1 mg/dL   Total Protein 7.5 6.5 - 8.1 g/dL   Albumin 4.2 3.5 - 5.0 g/dL   AST 18 15 - 41 U/L   ALT 13 (L) 14 - 54 U/L   Alkaline Phosphatase 95 38 - 126 U/L   Total Bilirubin 0.4 0.3 - 1.2 mg/dL   GFR calc non Af Amer >60 >60 mL/min   GFR calc Af Amer >60 >60 mL/min   Anion gap 8 5 - 15   ____________________________________________ ______________________________  RADIOLOGY I personally reviewed all radiographic images ordered to evaluate for the above acute complaints and reviewed  radiology reports and findings.  These findings were personally discussed with the patient.  Please see medical record for radiology report. ____________________________________________   PROCEDURES  Procedure(s) performed:  Procedures    Critical Care performed: no ____________________________________________   INITIAL IMPRESSION / ASSESSMENT AND PLAN / ED COURSE  Pertinent labs & imaging results that were available during my care of the patient were reviewed by me and considered in my medical decision making (see chart for details).  DDX: cva, tia, hypoglycemia, dehydration, electrolyte abnormality, dissection, sepsis   KHORI UNDERBERG is a 46 y.o. who presents to the ED with with Hx of migraine HA p/w HA for last 2 days. Not worst HA ever. Gradual onset. HA similar to previous episodes. Admits to focal neurologic symptoms with double vision.  MRI ordered to eval for TIA, Ms, lesion.  MRI with NAICA.  Patient without EOM deficit when distracted.  Denies trauma. No fevers or neck pain. PERRL.   Afebrile in ED. VSS. Exam as above. No meningeal signs. No CN, motor, sensory or cerebellar deficits. Temporal arteries palpable and non-tender. Appears well and non-toxic.  Will provide IV fluids for hydration and IV medications for symptom control.   Blood work reassuring.    Clinical Course as of Nov 24 1949  Sat Nov 24, 2016  1812 DG Chest 2 View [PR]  [id number] MR Brain Wo Contrast [PR]  1845 Patient resting comfortably.  [PR]  1900 Blurry vision has resolved. Patient states that her headaches frequently occur when she is stressed out. Denies any symptoms at this time. States she has been very stressed at home.  [PR]  1929 Pain improved. Repeat neuro exam is again without focal deficit, nuchal rigidity or evidence of meningeal irritation.  Stable to D/C home, follow up with PCP or Neurology if persistent recurrent Has.  Have discussed with the patient and available family all diagnostics and  treatments performed thus far and all questions were answered to the best of my ability. The patient demonstrates understanding and agreement with plan. Patient was able to tolerate PO and was able to ambulate with a steady gait.  Have discussed with the patient and  available family all diagnostics and treatments performed thus far and all questions were answered to the best of my ability. The patient demonstrates understanding and agreement with plan.  [PR]    Clinical Course User Index [PR] Willy EddyPatrick Gurshaan Matsuoka, MD     ____________________________________________   FINAL CLINICAL IMPRESSION(S) / ED DIAGNOSES  Final diagnoses:  Acute nonintractable headache, unspecified headache type  Nausea      NEW MEDICATIONS STARTED DURING THIS VISIT:  New Prescriptions   PROMETHAZINE (PHENERGAN) 12.5 MG TABLET    Take 1 tablet (12.5 mg total) by mouth every 6 (six) hours as needed for nausea or vomiting.     Note:  This document was prepared using Dragon voice recognition software and may include unintentional dictation errors.    Willy EddyPatrick Nicholis Stepanek, MD 11/24/16 [phone]

## 2016-11-24 NOTE — ED Notes (Signed)
Transported to MRI

## 2016-11-24 NOTE — ED Notes (Signed)
Pt states she will not take any medications for "they are the same from yesterday" states "thats why I had to come back here". Advised pt what she was given yesterday vs what I was going to give. Pt states oxycodone works best. Algis DownsAdvised we would be giving her migraine medications that work well. Pt then asked for phenergan. Advised compazine works with nausea as well as migraines.

## 2016-11-24 NOTE — Discharge Instructions (Signed)

## 2016-11-29 DIAGNOSIS — G43909 Migraine, unspecified, not intractable, without status migrainosus: Secondary | ICD-10-CM | POA: Insufficient documentation

## 2016-12-15 ENCOUNTER — Emergency Department
Admission: EM | Admit: 2016-12-15 | Discharge: 2016-12-15 | Disposition: A | Discharge status: Home / Self Care | Payer: Medicaid Other | Attending: Emergency Medicine | Admitting: Emergency Medicine

## 2016-12-15 ENCOUNTER — Encounter: Payer: Self-pay | Admitting: Emergency Medicine

## 2016-12-15 DIAGNOSIS — F17200 Nicotine dependence, unspecified, uncomplicated: Secondary | ICD-10-CM | POA: Diagnosis not present

## 2016-12-15 DIAGNOSIS — R51 Headache: Secondary | ICD-10-CM

## 2016-12-15 DIAGNOSIS — F329 Major depressive disorder, single episode, unspecified: Secondary | ICD-10-CM | POA: Diagnosis not present

## 2016-12-15 DIAGNOSIS — Z79899 Other long term (current) drug therapy: Secondary | ICD-10-CM | POA: Insufficient documentation

## 2016-12-15 DIAGNOSIS — R519 Headache, unspecified: Secondary | ICD-10-CM

## 2016-12-15 DIAGNOSIS — J45909 Unspecified asthma, uncomplicated: Secondary | ICD-10-CM | POA: Diagnosis not present

## 2016-12-15 DIAGNOSIS — I10 Essential (primary) hypertension: Secondary | ICD-10-CM | POA: Insufficient documentation

## 2016-12-15 DIAGNOSIS — G8929 Other chronic pain: Secondary | ICD-10-CM

## 2016-12-15 DIAGNOSIS — F32A Depression, unspecified: Secondary | ICD-10-CM

## 2016-12-15 LAB — CBC
HCT: 42.6 % (ref 35.0–47.0)
Hemoglobin: 14.8 g/dL (ref 12.0–16.0)
MCH: 33.1 pg (ref 26.0–34.0)
MCHC: 34.7 g/dL (ref 32.0–36.0)
MCV: 95.3 fL (ref 80.0–100.0)
Platelets: 314 10*3/uL (ref 150–440)
RBC: 4.47 MIL/uL (ref 3.80–5.20)
RDW: 14 % (ref 11.5–14.5)
WBC: 9.7 10*3/uL (ref 3.6–11.0)

## 2016-12-15 LAB — LIPASE, BLOOD: Lipase: 31 U/L (ref 11–51)

## 2016-12-15 LAB — COMPREHENSIVE METABOLIC PANEL
ALT: 18 U/L (ref 14–54)
AST: 22 U/L (ref 15–41)
Albumin: 4.1 g/dL (ref 3.5–5.0)
Alkaline Phosphatase: 124 U/L (ref 38–126)
Anion gap: 13 (ref 5–15)
BUN: 10 mg/dL (ref 6–20)
CO2: 21 mmol/L — ABNORMAL LOW (ref 22–32)
Calcium: 9.5 mg/dL (ref 8.9–10.3)
Chloride: 105 mmol/L (ref 101–111)
Creatinine, Ser: 0.65 mg/dL (ref 0.44–1.00)
GFR calc Af Amer: >60 mL/min (ref >60)
GFR calc non Af Amer: >60 mL/min (ref >60)
Glucose, Bld: 118 mg/dL — ABNORMAL HIGH (ref 65–99)
Potassium: 3.2 mmol/L — ABNORMAL LOW (ref 3.5–5.1)
Sodium: 139 mmol/L (ref 135–145)
Total Bilirubin: 0.4 mg/dL (ref 0.3–1.2)
Total Protein: 7.7 g/dL (ref 6.5–8.1)

## 2016-12-15 MED ORDER — POTASSIUM CHLORIDE CRYS ER 20 MEQ PO TBCR
40.0000 meq | EXTENDED_RELEASE_TABLET | Freq: Once | ORAL | Status: AC
  Administered 2016-12-15: 40 meq via ORAL

## 2016-12-15 MED ORDER — SODIUM CHLORIDE 0.9 % IV BOLUS (SEPSIS)
1000.0000 mL | Freq: Once | INTRAVENOUS | Status: AC
  Administered 2016-12-15: 1000 mL via INTRAVENOUS

## 2016-12-15 MED ORDER — LORAZEPAM 2 MG/ML IJ SOLN
1.0000 mg | Freq: Once | INTRAMUSCULAR | Status: AC
  Administered 2016-12-15: 1 mg via INTRAVENOUS
  Filled 2016-12-15: qty 1

## 2016-12-15 MED ORDER — PROCHLORPERAZINE EDISYLATE 5 MG/ML IJ SOLN
10.0000 mg | Freq: Once | INTRAMUSCULAR | Status: AC
  Administered 2016-12-15: 10 mg via INTRAVENOUS
  Filled 2016-12-15 (×2): qty 2

## 2016-12-15 MED ORDER — ONDANSETRON 4 MG PO TBDP
4.0000 mg | ORAL_TABLET | Freq: Once | ORAL | Status: AC | PRN
  Administered 2016-12-15: 4 mg via ORAL
  Filled 2016-12-15: qty 1

## 2016-12-15 MED ORDER — POTASSIUM CHLORIDE CRYS ER 20 MEQ PO TBCR
EXTENDED_RELEASE_TABLET | ORAL | Status: AC
  Administered 2016-12-15: 40 meq via ORAL
  Filled 2016-12-15: qty 2

## 2016-12-15 NOTE — ED Provider Notes (Signed)
[facility] Emergency Department Provider Note  ____________________________________________   I have reviewed the triage vital signs and the nursing notes.   HISTORY  Chief Complaint Emesis; Abdominal Pain; and Dizziness    HPI Michelle Conley is a 46 y.o. female patient is asleep and rather when I wake her up she begins to cry and states that she has had a headache since "her whole life". She states she sometimes gets triple vision in one eye and double vision and the other. The headaches have been persistent for her entire life. Specifically she has had headaches intermittently now since around Christmas time. She has had a negative MRI for this she is seen neurology. Patient states that she has had some vomiting. Patient denies any focal neurologic deficits aside from the above-mentioned visual issues which apparently are chronic as well. She denies any focal numbness or weakness abdominal pain. She states that her whole body hurts sometimes. Patient is tearful and states also she is depressed about the death of her son in 2008. Apparently he was hit by a car. However, she has no SI no HI and she has a point with her psychiatrist in the next couple days. She does not wish to be admitted for psychiatric reasons for help with this. She wants me to make her headache go away which is a generalized headache which is been there as mentioned for nearly a month and a half uninterrupted landing on her whole life before that. She cannot give me any guidance about what might help her. Review of Dr. Daisy BlossomPotter's last note suggests that the patient has been having difficulties with taking multiple medications for some time. They did try to wean her off her home medications. When I asked her what the plan for her doctor was for her headaches, and she states that "just don't look at the light" was her plan. I have reviewed Dr. Daisy BlossomPotter's note.      Past Medical History:  Diagnosis Date   . Abnormal chest xray    Reports that she has spots on her lungs  . Anemia   . Anxiety   . Aortic valve disorder   . Aortic valve insufficiency   . Asthma   . Cervical dysplasia   . Complication of anesthesia    reports that she comes out of it slowly  . Cranial nerve palsy   . Depression   . GERD (gastroesophageal reflux disease)   . Goiter   . History of kidney stones   . History of meningitis   . History of viral encephalitis   . Hypertension   . Hypothyroidism   . Migraine headache   . Post traumatic stress disorder (PTSD)   . Scarlet fever   . Shortness of breath dyspnea   . Stroke (HCC)   . Syncope and collapse     There are no active problems to display for this patient.   Past Surgical History:  Procedure Laterality Date  . CESAREAN SECTION  1997  . CHOLECYSTECTOMY  2015  . LINGUAL FRENECTOMY N/A 06/20/2015   Procedure: UPPER LABIAL L FRENECTOMY;  Surgeon: Lincoln Brighamhristopher [place], DDS;  Location: [facility] OR;  Service: Oral Surgery;  Laterality: N/A;  . MULTIPLE EXTRACTIONS WITH ALVEOLOPLASTY N/A 06/20/2015   Procedure: EXTRACTION  OF TEETH NUMBERS 2-15, 19-30,ALVEOLOPLASTY OF MAXILLA  AND MANDIBLE;  Surgeon: Lincoln Brighamhristopher [place], DDS;  Location: [facility] OR;  Service: Oral Surgery;  Laterality: N/A;  . NASAL SEPTUM SURGERY  Prior to Admission medications   Medication Sig Start Date End Date Taking? Authorizing Provider  albuterol (PROVENTIL HFA;VENTOLIN HFA) 108 (90 BASE) MCG/ACT inhaler Inhale 1 puff into the lungs every 6 (six) hours as needed for wheezing or shortness of breath.    Historical Provider, MD  alprazolam Prudy Feeler) 2 MG tablet Take 2 mg by mouth 4 (four) times daily.    Historical Provider, MD  beclomethasone (QVAR) 80 MCG/ACT inhaler Inhale 2 puffs into the lungs every 6 (six) hours as needed (wheezing or shortness of breath).    Historical Provider, MD  EPINEPHrine (EPIPEN 2-PAK) 0.3 mg/0.3 mL IJ SOAJ injection Inject 0.3 mg into the muscle once as needed  (allergic reaction).    Historical Provider, MD  escitalopram (LEXAPRO) 20 MG tablet Take 20 mg by mouth daily.    Historical Provider, MD  ibuprofen (ADVIL,MOTRIN) 800 MG tablet Take 800 mg by mouth every 8 (eight) hours as needed for mild pain or moderate pain.    Historical Provider, MD  levothyroxine (SYNTHROID, LEVOTHROID) 300 MCG tablet Take 300 mcg by mouth daily.    Historical Provider, MD  omeprazole (PRILOSEC) 40 MG capsule Take 40 mg by mouth 2 (two) times daily.    Historical Provider, MD  promethazine (PHENERGAN) 12.5 MG tablet Take 1 tablet (12.5 mg total) by mouth every 6 (six) hours as needed for nausea or vomiting. 11/24/16   Willy Eddy, MD  simvastatin (ZOCOR) 20 MG tablet Take 20 mg by mouth every evening.    Historical Provider, MD  tiotropium (SPIRIVA) 18 MCG inhalation capsule Place 18 mcg into inhaler and inhale daily.     Historical Provider, MD  traZODone (DESYREL) 100 MG tablet Take 100 mg by mouth at bedtime as needed for sleep.    Historical Provider, MD    Allergies Cyclobenzaprine; Esomeprazole magnesium; Metronidazole; Mobic [meloxicam]; Rofecoxib; Tramadol; and Penicillins  History reviewed. No pertinent family history.  Social History Social History  Substance Use Topics  . Smoking status: Current Every Day Smoker    Packs/day: 1.00    Years: 30.00  . Smokeless tobacco: Never Used  . Alcohol use No    Review of Systems Constitutional: No fever/chills Eyes: No visual changes. ENT: No sore throat. No stiff neck no neck pain Cardiovascular: Denies chest pain. Respiratory: Denies shortness of breath. Gastrointestinal:   no vomiting.  No diarrhea.  No constipation. Genitourinary: Negative for dysuria. Musculoskeletal: Negative lower extremity swelling Skin: Negative for rash. Neurological: Negative for severe headaches, focal weakness or numbness. 10-point ROS otherwise negative.  ____________________________________________   PHYSICAL  EXAM:  VITAL SIGNS: ED Triage Vitals  Enc Vitals Group     BP 12/15/16 1528 132/74     Pulse Rate 12/15/16 1528 (!) 102     Resp 12/15/16 1528 16     Temp 12/15/16 1528 98.5 F (36.9 C)     Temp Source 12/15/16 1528 Oral     SpO2 12/15/16 1528 98 %     Weight 12/15/16 1529 145 lb (65.8 kg)     Height 12/15/16 1529 5' (1.524 m)     Head Circumference --      Peak Flow --      Pain Score 12/15/16 1541 10     Pain Loc --      Pain Edu? --      Excl. in GC? --     Constitutional: Alert and oriented. Patient is initially asleep wakes up and begins to cry but otherwise no acute  distress Eyes: Conjunctivae are normal. PERRL. EOMI. Head: Atraumatic. Nose: No congestion/rhinnorhea. Mouth/Throat: Mucous membranes are moist.  Oropharynx non-erythematous. Neck: No stridor.   Nontender with no meningismus Cardiovascular: Normal rate, regular rhythm. Grossly normal heart sounds.  Good peripheral circulation. Respiratory: Normal respiratory effort.  No retractions. Lungs CTAB. Abdominal: Soft and nontender. No distention. No guarding no rebound Back:  There is no focal tenderness or step off.  there is no midline tenderness there are no lesions noted. there is no CVA tenderness Musculoskeletal: No lower extremity tenderness, no upper extremity tenderness. No joint effusions, no DVT signs strong distal pulses no edema Neurologic:  Normal speech and language. No gross focal neurologic deficits are appreciated.  Skin:  Skin is warm, dry and intact. No rash noted. Psychiatric: Mood and affect are somewhat depressed. Speech and behavior are normal.  ____________________________________________   LABS (all labs ordered are listed, but only abnormal results are displayed)  Labs Reviewed  COMPREHENSIVE METABOLIC PANEL - Abnormal; Notable for the following:       Result Value   Potassium 3.2 (*)    CO2 21 (*)    Glucose, Bld 118 (*)    All other components within normal limits  LIPASE, BLOOD   CBC  URINALYSIS, COMPLETE (UACMP) WITH MICROSCOPIC   ____________________________________________  EKG  I personally interpreted any EKGs ordered by me or triage  ____________________________________________  RADIOLOGY  I reviewed any imaging ordered by me or triage that were performed during my shift and, if possible, patient and/or family made aware of any abnormal findings. ____________________________________________   PROCEDURES  Procedure(s) performed: None  Procedures  Critical Care performed: None  ____________________________________________   INITIAL IMPRESSION / ASSESSMENT AND PLAN / ED COURSE  Pertinent labs & imaging results that were available during my care of the patient were reviewed by me and considered in my medical decision making (see chart for details).  Patient with a headache and also depression. Headache is chronic, no evidence of any acute decompensated toward Medicaid hasn't today. We will give her medication for but I very strongly doubt that anything I do have any impact on it. We will defer narcotic pain medication. Patient also has a history of depression. She is not suicidal or homicidal. We'll have TTS evaluate her and she already has outpatient follow-up. She does not wish any further care for her depression from me. She states "nothing will help anyway".  ----------------------------------------- 6:34 PM on 12/15/2016 -----------------------------------------  Patient initially states she is feeling somewhat better, headache is completely gone but more manageable. Remains neurologically intact. Has been evaluated by TTS. They agree there is no evidence of SI or HI. Patient does contract for safety. Patient will see her psychiatrist in 2 days. We will discharge her with close outpatient follow-up with neurology and psychiatry    ____________________________________________   FINAL CLINICAL IMPRESSION(S) / ED DIAGNOSES  Final  diagnoses:  None      This chart was dictated using voice recognition software.  Despite best efforts to proofread,  errors can occur which can change meaning.      Jeanmarie Plant, MD 12/15/16 [phone]

## 2016-12-15 NOTE — ED Notes (Addendum)
Pt has hx of migraines - saw dr Malvin Johnspotter on 1/25 and was taken off all otc meds and put on a medrol pack and nortryptiline 10mg  and to increase to 20 mgs after one week. Pt states not able to hold down her robaxin, lexapro, etc and when she called dr. Malvin JohnsPotter they would not refill her phenergan.

## 2016-12-15 NOTE — BH Assessment (Signed)
Discussed patient with ER MD (Dr. Alphonzo LemmingsMcshane) and patient is able to discharge home when medically cleared. She currently receives mental health Out[facility], under the psychiatric care of Dr. Stevphen RochesterHansen Su. Per her report, her next appointment is Wednesday 12/19/2016.

## 2016-12-15 NOTE — ED Notes (Signed)
Pt ambulated to the bathroom with steady gait. No longer has the room dark and head under the covers and talking in complete sentences. Pt states pain down from "12/10 to 9/10"

## 2016-12-15 NOTE — ED Triage Notes (Addendum)
Pt states she was seen here on 1/19 and 1/20 for vision changes, continues to have difficulty seeing since then, also c/o headache and abdominal pain since 1/19.  Pt states she was prescribed medication she is allergic to.  Nausea and vomiting as well since 1/19. Pt states she is now tired of dealing with it.  Pt is able to keep some liquids down. Pt states she was seen by Dr. Malvin JohnsPotter since discharge.
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
## 2018-01-27 ENCOUNTER — Emergency Department
Admission: EM | Admit: 2018-01-27 | Discharge: 2018-01-27 | Disposition: A | Discharge status: Home / Self Care | Payer: Medicaid Other | Attending: Emergency Medicine | Admitting: Emergency Medicine

## 2018-01-27 ENCOUNTER — Encounter: Payer: Self-pay | Admitting: Medical Oncology

## 2018-01-27 ENCOUNTER — Emergency Department: Payer: Medicaid Other

## 2018-01-27 DIAGNOSIS — J449 Chronic obstructive pulmonary disease, unspecified: Secondary | ICD-10-CM | POA: Diagnosis not present

## 2018-01-27 DIAGNOSIS — E039 Hypothyroidism, unspecified: Secondary | ICD-10-CM | POA: Insufficient documentation

## 2018-01-27 DIAGNOSIS — R51 Headache: Secondary | ICD-10-CM | POA: Insufficient documentation

## 2018-01-27 DIAGNOSIS — F17200 Nicotine dependence, unspecified, uncomplicated: Secondary | ICD-10-CM | POA: Insufficient documentation

## 2018-01-27 DIAGNOSIS — I10 Essential (primary) hypertension: Secondary | ICD-10-CM | POA: Insufficient documentation

## 2018-01-27 DIAGNOSIS — M545 Low back pain, unspecified: Secondary | ICD-10-CM

## 2018-01-27 DIAGNOSIS — G8929 Other chronic pain: Secondary | ICD-10-CM | POA: Insufficient documentation

## 2018-01-27 DIAGNOSIS — Z79899 Other long term (current) drug therapy: Secondary | ICD-10-CM | POA: Insufficient documentation

## 2018-01-27 DIAGNOSIS — R05 Cough: Secondary | ICD-10-CM | POA: Diagnosis present

## 2018-01-27 DIAGNOSIS — B9789 Other viral agents as the cause of diseases classified elsewhere: Secondary | ICD-10-CM | POA: Insufficient documentation

## 2018-01-27 DIAGNOSIS — J069 Acute upper respiratory infection, unspecified: Secondary | ICD-10-CM | POA: Diagnosis not present

## 2018-01-27 DIAGNOSIS — R519 Headache, unspecified: Secondary | ICD-10-CM

## 2018-01-27 MED ORDER — BENZONATATE 100 MG PO CAPS: 200.0000 mg | ORAL_CAPSULE | Freq: Three times a day (TID) | ORAL | 0 refills | Status: AC | PRN

## 2018-01-27 MED ORDER — BUTALBITAL-APAP-CAFFEINE 50-325-40 MG PO TABS: 1.0000 | ORAL_TABLET | Freq: Four times a day (QID) | ORAL | 0 refills | Status: AC | PRN

## 2018-01-27 MED ORDER — ALBUTEROL SULFATE HFA 108 (90 BASE) MCG/ACT IN AERS: 1.0000 | INHALATION_SPRAY | Freq: Four times a day (QID) | RESPIRATORY_TRACT | 0 refills | Status: DC | PRN

## 2018-01-27 MED ORDER — DEXAMETHASONE SODIUM PHOSPHATE 10 MG/ML IJ SOLN
10.0000 mg | Freq: Once | INTRAMUSCULAR | Status: AC
  Administered 2018-01-27: 10 mg via INTRAMUSCULAR
  Filled 2018-01-27: qty 1

## 2018-01-27 NOTE — Discharge Instructions (Signed)
Follow-up with your primary care provider for continued medication for your COPD and discontinue smoking.  Increase fluids.  Take Tessalon Perles as needed for cough.  Albuterol inhaler every 6 hours as needed for shortness of breath or wheezing.  Also Fioricet 1 every 6 hours as needed for headache.  You will need to schedule appointment with Dr. Theora MasterZachary Potter for continued nortriptyline medication.  The shot you received in the emergency department should help with your headache as well as with your COPD and back pain.

## 2018-01-27 NOTE — ED Triage Notes (Signed)
Pt reports she began having back pain, nasal congestion, headache, sinus pain Sunday.

## 2018-01-27 NOTE — ED Notes (Signed)
Pt discharged home after verbalizing understanding of discharge instructions; nad noted. 

## 2018-01-27 NOTE — ED Notes (Signed)
See triage note  Presents with nasal congestion and headache which started on Sunday  Afebrile on arrival

## 2018-01-27 NOTE — ED Provider Notes (Signed)
[facility] Emergency Department Provider Note  ____________________________________________   First MD Initiated Contact with Patient 01/27/18 1456     (approximate)  I have reviewed the triage vital signs and the nursing notes.   HISTORY  Chief Complaint Back Pain; Headache; Cough; and Nasal Congestion   HPI Michelle Conley is a 47 y.o. female presents to the emergency department with multiple complaints.  Patient states that she has back pain for which it is chronic that has been worse for the last several days.  She also complains of a migraine headache that she has had off and on for the last 2 weeks.  Patient was seeing a neurologist who had her on migraine prevention medicine however she discontinued taking it a year ago when she "lost her Medicaid".  Patient also complains of sinus pain, nasal congestion and cough.  Patient continues to smoke cigarettes.  She has not been taking any over-the-counter medication for her cough.  Patient denies any urinary symptoms, kidney stone history, incontinence of bowel or bladder.  Patient states that the IV headache medication only makes her headaches worse.  She has been seen in the ED and given this before.  She denies any recent injury to be causing her back pain.  She denies any nausea, vomiting, or photophobia.  She rates her pain as 7 out of 10.   Past Medical History:  Diagnosis Date  . Abnormal chest xray    Reports that she has spots on her lungs  . Anemia   . Anxiety   . Aortic valve disorder   . Aortic valve insufficiency   . Asthma   . Cervical dysplasia   . Complication of anesthesia    reports that she comes out of it slowly  . Cranial nerve palsy   . Depression   . GERD (gastroesophageal reflux disease)   . Goiter   . History of kidney stones   . History of meningitis   . History of viral encephalitis   . Hypertension   . Hypothyroidism   . Migraine headache   . Post traumatic stress  disorder (PTSD)   . Scarlet fever   . Shortness of breath dyspnea   . Stroke (HCC)   . Syncope and collapse     There are no active problems to display for this patient.   Past Surgical History:  Procedure Laterality Date  . CESAREAN SECTION  1997  . CHOLECYSTECTOMY  2015  . LINGUAL FRENECTOMY N/A 06/20/2015   Procedure: UPPER LABIAL L FRENECTOMY;  Surgeon: Lincoln Brigham, DDS;  Location: [facility] OR;  Service: Oral Surgery;  Laterality: N/A;  . MULTIPLE EXTRACTIONS WITH ALVEOLOPLASTY N/A 06/20/2015   Procedure: EXTRACTION  OF TEETH NUMBERS 2-15, 19-30,ALVEOLOPLASTY OF MAXILLA  AND MANDIBLE;  Surgeon: Lincoln Brigham, DDS;  Location: [facility] OR;  Service: Oral Surgery;  Laterality: N/A;  . NASAL SEPTUM SURGERY      Prior to Admission medications   Medication Sig Start Date End Date Taking? Authorizing Provider  albuterol (PROVENTIL HFA;VENTOLIN HFA) 108 (90 Base) MCG/ACT inhaler Inhale 1 puff into the lungs every 6 (six) hours as needed for wheezing or shortness of breath. 01/27/18   Tommi Rumps, PA-C  alprazolam Prudy Feeler) 2 MG tablet Take 2 mg by mouth 4 (four) times daily.    [provider]  beclomethasone (QVAR) 80 MCG/ACT inhaler Inhale 2 puffs into the lungs every 6 (six) hours as needed (wheezing or shortness of breath).    [provider]  benzonatate (TESSALON PERLES) 100 MG capsule Take 2 capsules (200 mg total) by mouth 3 (three) times daily as needed. 01/27/18 01/27/19  Tommi RumpsSummers, Hong Timm L, PA-C  butalbital-acetaminophen-caffeine [place](FIORICET, ESGIC) [phone] MG tablet Take 1 tablet by mouth every 6 (six) hours as needed for headache. 01/27/18 01/27/19  Tommi RumpsSummers, Donelda Mailhot L, PA-C  EPINEPHrine (EPIPEN 2-PAK) 0.3 mg/0.3 mL IJ SOAJ injection Inject 0.3 mg into the muscle once as needed (allergic reaction).    [provider]  escitalopram (LEXAPRO) 20 MG tablet Take 20 mg by mouth daily.    [provider]  ibuprofen (ADVIL,MOTRIN) 800 MG tablet Take 800  mg by mouth every 8 (eight) hours as needed for mild pain or moderate pain.    [provider]  levothyroxine (SYNTHROID, LEVOTHROID) 300 MCG tablet Take 300 mcg by mouth daily.    [provider]  omeprazole (PRILOSEC) 40 MG capsule Take 40 mg by mouth 2 (two) times daily.    [provider]  promethazine (PHENERGAN) 12.5 MG tablet Take 1 tablet (12.5 mg total) by mouth every 6 (six) hours as needed for nausea or vomiting. 11/24/16   Willy Eddyobinson, Patrick, MD  simvastatin (ZOCOR) 20 MG tablet Take 20 mg by mouth every evening.    [provider]  tiotropium (SPIRIVA) 18 MCG inhalation capsule Place 18 mcg into inhaler and inhale daily.     [provider]  traZODone (DESYREL) 100 MG tablet Take 100 mg by mouth at bedtime as needed for sleep.    [provider]    Allergies Cyclobenzaprine; Esomeprazole magnesium; Metronidazole; Mobic [meloxicam]; Rofecoxib; Tramadol; and Penicillins  No family history on file.  Social History Social History   Tobacco Use  . Smoking status: Current Every Day Smoker    Packs/day: 1.00    Years: 30.00    Pack years: 30.00  . Smokeless tobacco: Never Used  Substance Use Topics  . Alcohol use: No  . Drug use: Yes    Types: Marijuana    Review of Systems Constitutional: No fever/chills Eyes: No visual changes. ENT: No sore throat.  Positive nasal congestion.  Positive sinus congestion.  Positive ear pain. Cardiovascular: Denies chest pain. Respiratory: Denies shortness of breath.  Positive cough. Gastrointestinal: No abdominal pain.  No nausea, no vomiting.  No diarrhea.   Genitourinary: Negative for dysuria. Musculoskeletal: Positive for low back pain. Skin: Negative for rash. Neurological: Positive for headaches, no focal weakness or numbness. Psychiatric:Positive history of depression and PTSD. ____________________________________________   PHYSICAL EXAM:  VITAL SIGNS: ED Triage Vitals  [01/27/18 1424]  Enc Vitals Group     BP (!) 149/78     Pulse Rate 86     Resp 19     Temp 97.9 F (36.6 C)     Temp Source Oral     SpO2 98 %     Weight 140 lb (63.5 kg)     Height 5' (1.524 m)     Head Circumference      Peak Flow      Pain Score 7     Pain Loc      Pain Edu?      Excl. in GC?    Constitutional: Alert and oriented. Well appearing and in no acute distress.  Patient is tearful in talking about her multiple medical problems and including her multiple family and social problems. Eyes: Conjunctivae are normal. PERRL. EOMI. no photophobia. Head: Atraumatic. Nose: Mild congestion/rhinnorhea.  TMs are dull bilaterally. Mouth/Throat:  Mucous membranes are moist.  Oropharynx non-erythematous.  Neck: No stridor.  No cervical tenderness on palpation posteriorly.  Range of motion is without restriction or pain. Hematological/Lymphatic/Immunilogical: No cervical lymphadenopathy. Cardiovascular: Normal rate, regular rhythm. Grossly normal heart sounds.  Good peripheral circulation. Respiratory: Normal respiratory effort.  No retractions. Lungs CTAB.  Patient does have a very coarse congested cough. Gastrointestinal: Soft and nontender. No distention.  No CVA tenderness. Musculoskeletal: Moves upper and lower extremities without any difficulty.  Normal gait was noted.  On examination of the back there is no gross deformity noted.  There is diffuse tenderness on palpation of the lumbar spine.  No ecchymosis or abrasions are seen.  Range of motion is without restriction or muscle spasms.  Right leg raises are negative. Neurologic:  Normal speech and language. No gross focal neurologic deficits are appreciated.  Cranial nerves II through XII grossly intact.  Reflexes are 2+ bilaterally.  No gait instability. Skin:  Skin is warm, dry and intact. No rash noted. Psychiatric: Mood and affect are normal. Speech and behavior are normal.  ____________________________________________    LABS (all labs ordered are listed, but only abnormal results are displayed)  Labs Reviewed - No data to display  RADIOLOGY  ED MD interpretation:   No pneumonia noted.  Official radiology report(s): Dg Chest 2 View  Result Date: 01/27/2018 CLINICAL DATA:  Pt reports she began having back pain, nasal congestion, headache, sinus pain Sunday. smoker EXAM: CHEST - 2 VIEW COMPARISON:  11/24/2016 FINDINGS: Cardiac silhouette is normal in size and configuration. Normal mediastinal and hilar contours. Lungs are clear with prominent bronchovascular markings. No evidence of pneumonia or pulmonary edema. No pleural effusion or pneumothorax. Skeletal structures are unremarkable. IMPRESSION: 1. No acute cardiopulmonary disease. 2. Hyperexpanded lungs with prominent bronchovascular markings consistent with COPD. Stable appearance from the prior study. Electronically Signed   By: David  Ormond M.D.   On: 01/27/2018 16:52    ____________________________________________   PROCEDURES  Procedure(s) performed: None  Procedures  Critical Care performed: No  ____________________________________________   INITIAL IMPRESSION / ASSESSMENT AND PLAN / ED COURSE  As part of my medical decision making, I reviewed the following data within the electronic MEDICAL RECORD NUMBER Notes from prior ED visits and [place] Controlled Substance Database  Patient is encouraged to follow-up with her PCP and also with Dr. Zachary Potter for her continued medication for migraines.  Patient was given Decadron 10 mg IM in the department for both her cough, COPD and back pain.  Patient declined headache cocktail per IV stating this would make her headache worse.  Patient is encouraged to continue with her inhalers for her COPD and discontinue smoking.  She was given a prescription for albuterol inhaler and Tessalon Perles as needed for cough.  Patient was given Fioricet 1 tablet every 6 hours as needed for headache #8.  She is  encouraged to increase fluids. ____________________________________________   FINAL CLINICAL IMPRESSION(S) / ED DIAGNOSES  Final diagnoses:  Chronic obstructive pulmonary disease, unspecified COPD type (HCC)  Viral URI with cough  Chronic nonintractable headache, unspecified headache type  Chronic low back pain without sciatica, unspecified back pain laterality     ED Discharge Orders        Ordered    butalbital-acetaminophen-caffeine (FIORICET, ESGIC) 50-325-40 MG tablet  Every 6 hours PRN     01/27/18 1742    albuterol (PROVENTIL HFA;VENTOLIN HFA) 108 (90 Base) MCG/ACT inhaler  Every 6 hours PRN     03 /25/19 1743  benzonatate (TESSALON PERLES) 100 MG capsule  3 times daily PRN     01/27/18 1743       Note:  This document was prepared using Dragon voice recognition software and may include unintentional dictation errors.    Tommi Rumps, PA-C 01/27/18 1950    Jeanmarie Plant, MD 02/07/18 [phone]

## 2018-06-17 IMAGING — CR DG CHEST 2V
2 series · 2 of 2 positions shown · non-contrast
Comparison: 12/05/2005

CLINICAL DATA: Blurred vision beginning yesterday. Severe headache.

EXAM:
CHEST  2 VIEW

[chest pa]
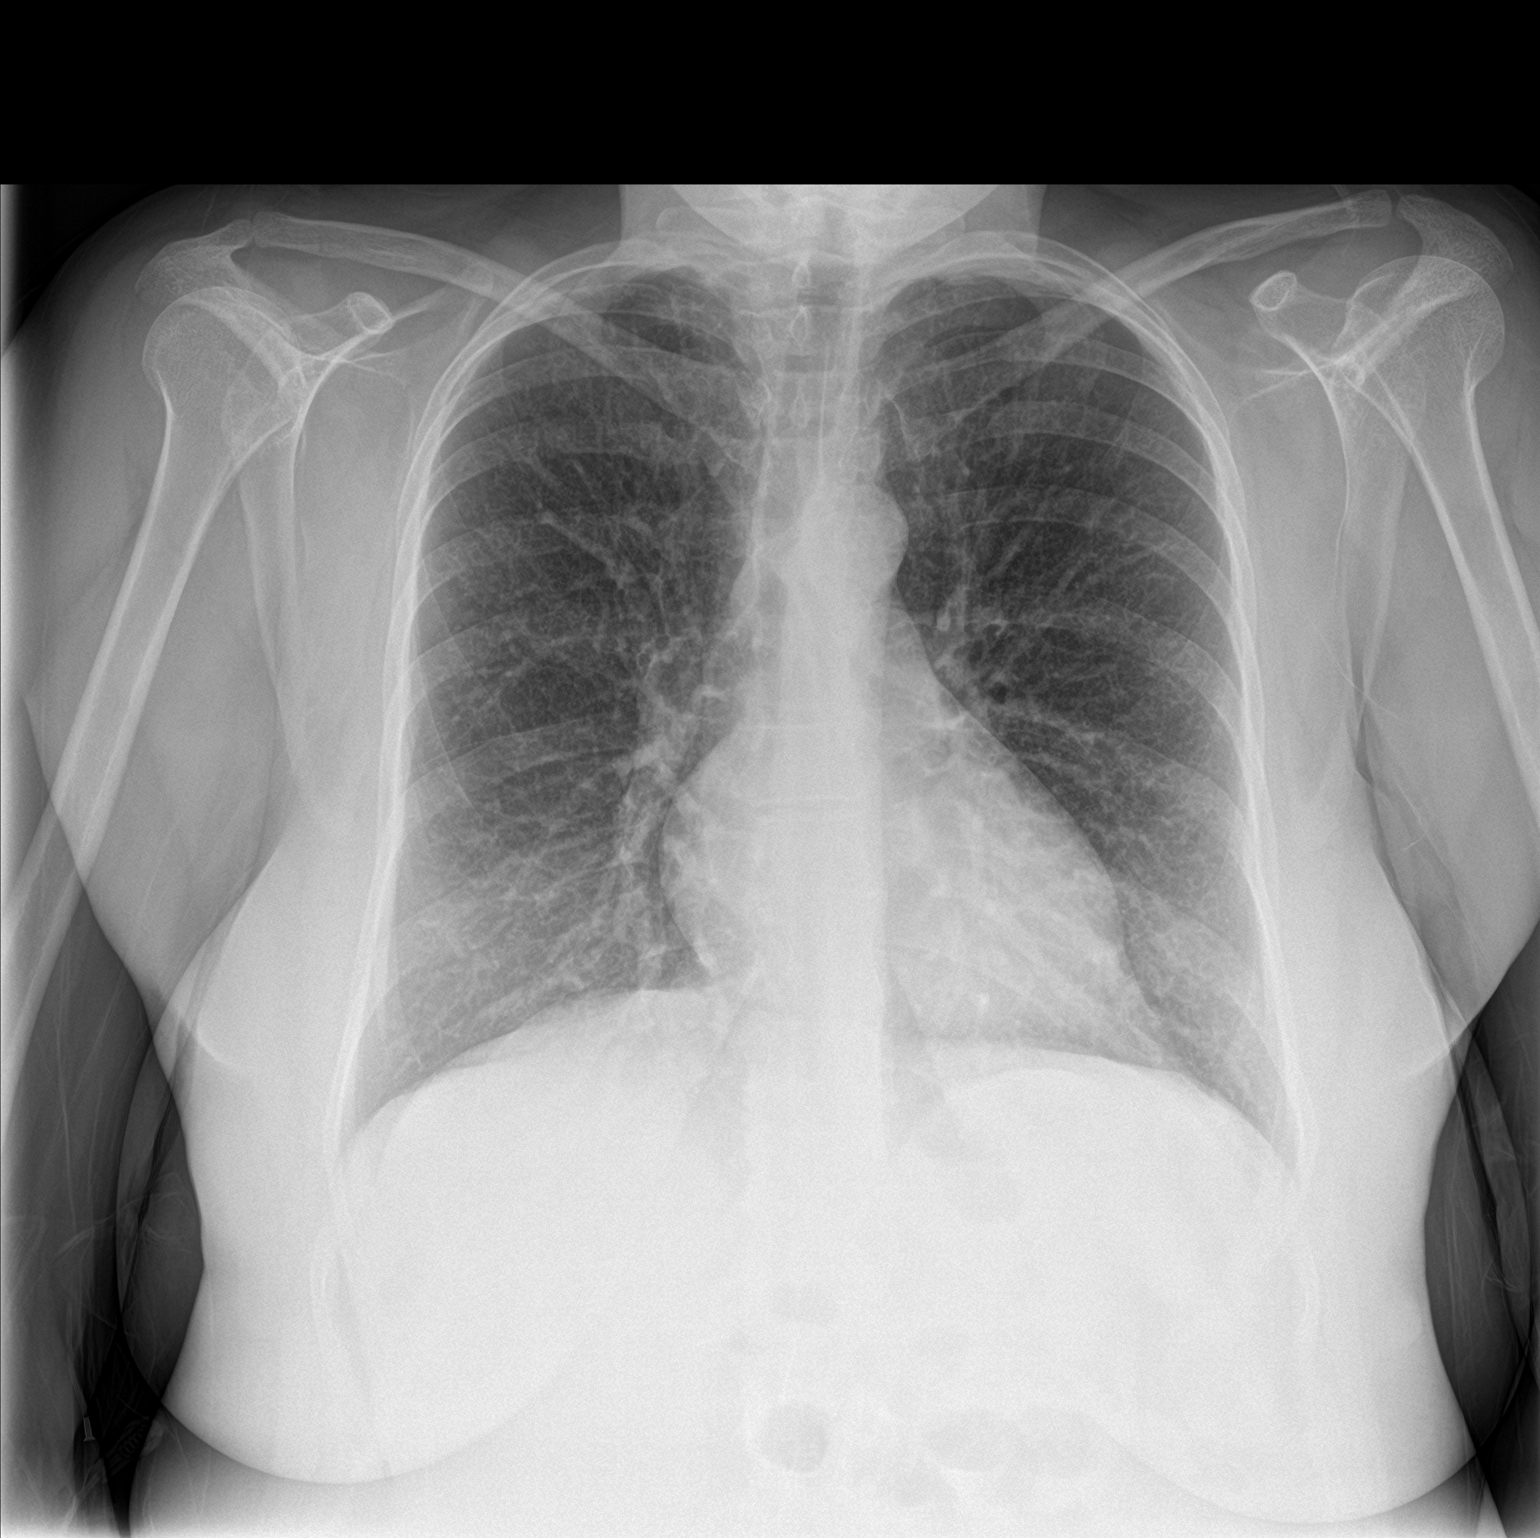

[chest lat]
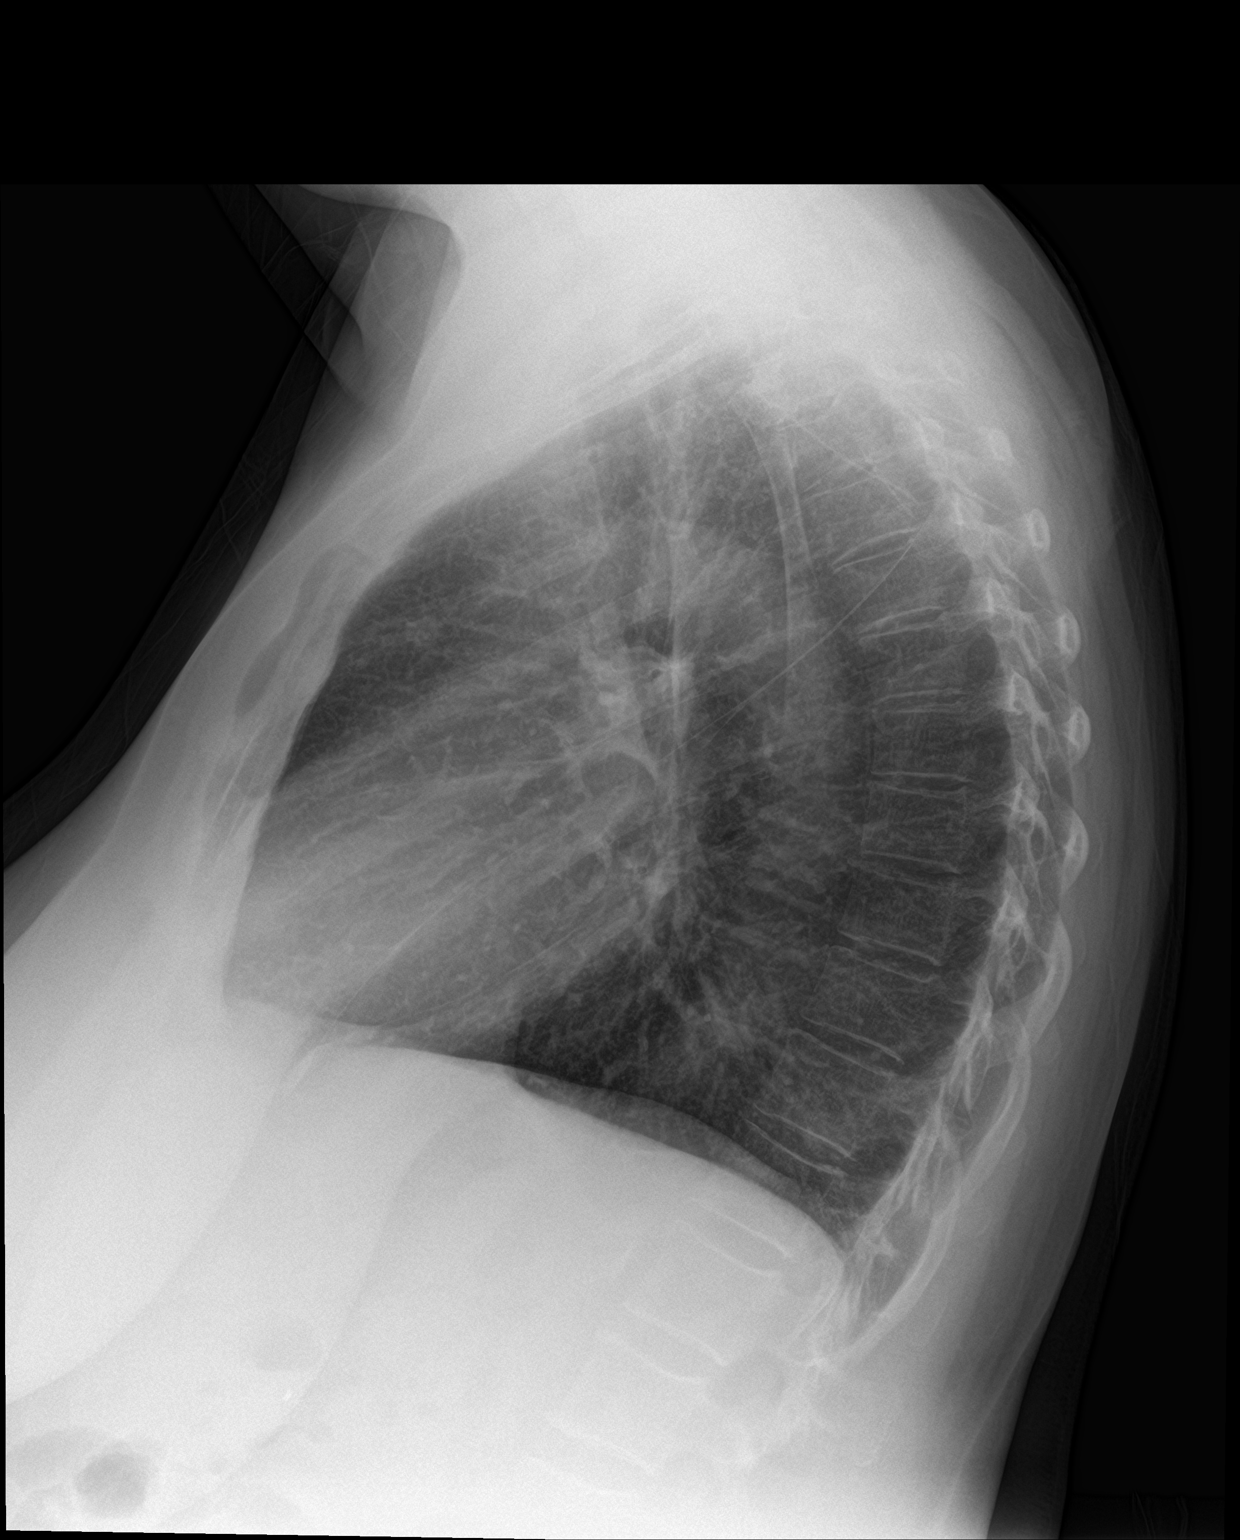

[2 of 2 positions shown; findings below may reference images not displayed]

FINDINGS: The heart size and mediastinal contours are within normal limits.
Both lungs are clear. The visualized skeletal structures are
unremarkable.
IMPRESSION: No active cardiopulmonary disease.

## 2018-09-12 ENCOUNTER — Other Ambulatory Visit: Payer: Self-pay | Admitting: Family Medicine

## 2018-09-12 DIAGNOSIS — J449 Chronic obstructive pulmonary disease, unspecified: Secondary | ICD-10-CM

## 2018-09-25 ENCOUNTER — Ambulatory Visit: Payer: Disability Insurance

## 2018-09-25 ENCOUNTER — Other Ambulatory Visit: Payer: Self-pay | Admitting: Family Medicine

## 2018-09-25 ENCOUNTER — Ambulatory Visit
Admission: RE | Admit: 2018-09-25 | Discharge: 2018-09-25 | Disposition: A | Discharge status: Home / Self Care | Payer: Disability Insurance | Source: Ambulatory Visit | Attending: Family Medicine | Admitting: Family Medicine

## 2018-09-25 DIAGNOSIS — J449 Chronic obstructive pulmonary disease, unspecified: Secondary | ICD-10-CM

## 2018-09-25 DIAGNOSIS — M545 Low back pain, unspecified: Secondary | ICD-10-CM

## 2018-09-25 MED ORDER — ALBUTEROL SULFATE (2.5 MG/3ML) 0.083% IN NEBU
2.5000 mg | INHALATION_SOLUTION | Freq: Once | RESPIRATORY_TRACT | Status: AC
  Administered 2018-09-25: 2.5 mg via RESPIRATORY_TRACT
  Filled 2018-09-25: qty 3

## 2019-01-04 ENCOUNTER — Emergency Department: Payer: Medicaid Other

## 2019-01-04 ENCOUNTER — Encounter: Payer: Self-pay | Admitting: Emergency Medicine

## 2019-01-04 DIAGNOSIS — R05 Cough: Secondary | ICD-10-CM | POA: Diagnosis present

## 2019-01-04 DIAGNOSIS — J209 Acute bronchitis, unspecified: Secondary | ICD-10-CM | POA: Diagnosis not present

## 2019-01-04 DIAGNOSIS — R911 Solitary pulmonary nodule: Secondary | ICD-10-CM | POA: Insufficient documentation

## 2019-01-04 DIAGNOSIS — J45909 Unspecified asthma, uncomplicated: Secondary | ICD-10-CM | POA: Insufficient documentation

## 2019-01-04 DIAGNOSIS — H6693 Otitis media, unspecified, bilateral: Secondary | ICD-10-CM | POA: Diagnosis not present

## 2019-01-04 DIAGNOSIS — E039 Hypothyroidism, unspecified: Secondary | ICD-10-CM | POA: Diagnosis not present

## 2019-01-04 DIAGNOSIS — F329 Major depressive disorder, single episode, unspecified: Secondary | ICD-10-CM | POA: Diagnosis not present

## 2019-01-04 DIAGNOSIS — Z9049 Acquired absence of other specified parts of digestive tract: Secondary | ICD-10-CM | POA: Insufficient documentation

## 2019-01-04 DIAGNOSIS — Z8673 Personal history of transient ischemic attack (TIA), and cerebral infarction without residual deficits: Secondary | ICD-10-CM | POA: Insufficient documentation

## 2019-01-04 DIAGNOSIS — F17200 Nicotine dependence, unspecified, uncomplicated: Secondary | ICD-10-CM | POA: Insufficient documentation

## 2019-01-04 DIAGNOSIS — I10 Essential (primary) hypertension: Secondary | ICD-10-CM | POA: Insufficient documentation

## 2019-01-04 DIAGNOSIS — F419 Anxiety disorder, unspecified: Secondary | ICD-10-CM | POA: Diagnosis not present

## 2019-01-04 DIAGNOSIS — Z79899 Other long term (current) drug therapy: Secondary | ICD-10-CM | POA: Diagnosis not present

## 2019-01-04 LAB — CBC
HCT: 41.3 % (ref 36.0–46.0)
Hemoglobin: 14 g/dL (ref 12.0–15.0)
MCH: 29.2 pg (ref 26.0–34.0)
MCHC: 33.9 g/dL (ref 30.0–36.0)
MCV: 86 fL (ref 80.0–100.0)
Platelets: 394 K/uL (ref 150–400)
RBC: 4.8 MIL/uL (ref 3.87–5.11)
RDW: 14.4 % (ref 11.5–15.5)
WBC: 15.6 K/uL — ABNORMAL HIGH (ref 4.0–10.5)
nRBC: 0 % (ref 0.0–0.2)

## 2019-01-04 LAB — BASIC METABOLIC PANEL
Anion gap: 12 (ref 5–15)
BUN: 11 mg/dL (ref 6–20)
CO2: 26 mmol/L (ref 22–32)
Calcium: 8.9 mg/dL (ref 8.9–10.3)
Chloride: 104 mmol/L (ref 98–111)
Creatinine, Ser: 0.68 mg/dL (ref 0.44–1.00)
GFR calc Af Amer: >60 mL/min (ref >60)
GFR calc non Af Amer: >60 mL/min (ref >60)
Glucose, Bld: 123 mg/dL — ABNORMAL HIGH (ref 70–99)
Potassium: 2.6 mmol/L — CL (ref 3.5–5.1)
Sodium: 142 mmol/L (ref 135–145)

## 2019-01-04 LAB — TROPONIN I: Troponin I: <0.03 ng/mL (ref <0.03)

## 2019-01-04 LAB — GROUP A STREP BY PCR: Group A Strep by PCR: NOT DETECTED

## 2019-01-04 MED ORDER — ALBUTEROL SULFATE (2.5 MG/3ML) 0.083% IN NEBU: 5.0000 mg | INHALATION_SOLUTION | Freq: Once | RESPIRATORY_TRACT | Status: DC

## 2019-01-04 NOTE — ED Triage Notes (Signed)
Pt c/o cough, congestion, sore throat and ear pain with drainage x2 weeks. Pt to ED tonight due to SOB. Hx/o COPD and daily tobaccoo user.

## 2019-01-04 NOTE — ED Notes (Signed)
Patient given up date regarding wait time. 

## 2019-01-05 ENCOUNTER — Emergency Department: Payer: Medicaid Other

## 2019-01-05 ENCOUNTER — Emergency Department
Admission: EM | Admit: 2019-01-05 | Discharge: 2019-01-05 | Disposition: A | Discharge status: Home / Self Care | Payer: Medicaid Other | Attending: Emergency Medicine | Admitting: Emergency Medicine

## 2019-01-05 DIAGNOSIS — J209 Acute bronchitis, unspecified: Secondary | ICD-10-CM

## 2019-01-05 DIAGNOSIS — R911 Solitary pulmonary nodule: Secondary | ICD-10-CM

## 2019-01-05 DIAGNOSIS — H6690 Otitis media, unspecified, unspecified ear: Secondary | ICD-10-CM

## 2019-01-05 MED ORDER — PREDNISONE 20 MG PO TABS
60.0000 mg | ORAL_TABLET | Freq: Once | ORAL | Status: AC
  Administered 2019-01-05: 60 mg via ORAL
  Filled 2019-01-05: qty 3

## 2019-01-05 MED ORDER — POTASSIUM CHLORIDE CRYS ER 20 MEQ PO TBCR
40.0000 meq | EXTENDED_RELEASE_TABLET | Freq: Once | ORAL | Status: AC
  Administered 2019-01-05: 40 meq via ORAL
  Filled 2019-01-05: qty 2

## 2019-01-05 MED ORDER — AZITHROMYCIN 500 MG PO TABS: 500.0000 mg | ORAL_TABLET | Freq: Every day | ORAL | 0 refills | Status: AC

## 2019-01-05 MED ORDER — KETOROLAC TROMETHAMINE 10 MG PO TABS
10.0000 mg | ORAL_TABLET | Freq: Once | ORAL | Status: DC
  Filled 2019-01-05: qty 1

## 2019-01-05 MED ORDER — PREDNISONE 20 MG PO TABS: 40.0000 mg | ORAL_TABLET | Freq: Every day | ORAL | 0 refills | Status: AC

## 2019-01-05 MED ORDER — ALBUTEROL SULFATE HFA 108 (90 BASE) MCG/ACT IN AERS: 2.0000 | INHALATION_SPRAY | Freq: Four times a day (QID) | RESPIRATORY_TRACT | 0 refills | Status: DC | PRN

## 2019-01-05 MED ORDER — AZITHROMYCIN 500 MG PO TABS
500.0000 mg | ORAL_TABLET | Freq: Once | ORAL | Status: AC
  Administered 2019-01-05: 500 mg via ORAL
  Filled 2019-01-05: qty 1

## 2019-01-05 MED ORDER — BENZONATATE 100 MG PO CAPS
100.0000 mg | ORAL_CAPSULE | Freq: Once | ORAL | Status: AC
  Administered 2019-01-05: 100 mg via ORAL
  Filled 2019-01-05: qty 1

## 2019-01-05 MED ORDER — BENZONATATE 100 MG PO CAPS: 100.0000 mg | ORAL_CAPSULE | Freq: Three times a day (TID) | ORAL | 0 refills | Status: AC | PRN

## 2019-01-05 NOTE — ED Notes (Signed)
refused VS at DC  No peripheral IV placed this visit.   Discharge instructions reviewed with patient. Questions fielded by this RN. Patient verbalizes understanding of instructions. Patient discharged home in stable condition per brown. No acute distress noted at time of discharge.

## 2019-01-05 NOTE — ED Provider Notes (Signed)
[facility] Emergency Department Provider Note  ____________________________________________   First MD Initiated Contact with Patient 01/05/19 0050     (approximate)  I have reviewed the triage vital signs and the nursing notes.   HISTORY  Chief Complaint Cough; Otalgia; and Sore Throat   HPI Michelle Conley is a 48 y.o. female with history of COPD and daily tobacco use presents to the emergency department 2-week history of cough congestion sore throat and ear pain bilaterally.  Patient denies any chest pain.  Patient denies any fever afebrile on presentation.        Past Medical History:  Diagnosis Date  . Abnormal chest xray    Reports that she has spots on her lungs  . Anemia   . Anxiety   . Aortic valve disorder   . Aortic valve insufficiency   . Asthma   . Cervical dysplasia   . Complication of anesthesia    reports that she comes out of it slowly  . Cranial nerve palsy   . Depression   . GERD (gastroesophageal reflux disease)   . Goiter   . History of kidney stones   . History of meningitis   . History of viral encephalitis   . Hypertension   . Hypothyroidism   . Migraine headache   . Post traumatic stress disorder (PTSD)   . Scarlet fever   . Shortness of breath dyspnea   . Stroke (HCC)   . Syncope and collapse     There are no active problems to display for this patient.   Past Surgical History:  Procedure Laterality Date  . CESAREAN SECTION  1997  . CHOLECYSTECTOMY  2015  . LINGUAL FRENECTOMY N/A 06/20/2015   Procedure: UPPER LABIAL L FRENECTOMY;  Surgeon: Lincoln Brigham, DDS;  Location: [facility] OR;  Service: Oral Surgery;  Laterality: N/A;  . MULTIPLE EXTRACTIONS WITH ALVEOLOPLASTY N/A 06/20/2015   Procedure: EXTRACTION  OF TEETH NUMBERS 2-15, 19-30,ALVEOLOPLASTY OF MAXILLA  AND MANDIBLE;  Surgeon: Lincoln Brigham, DDS;  Location: [facility] OR;  Service: Oral Surgery;  Laterality: N/A;  . NASAL SEPTUM SURGERY      Prior  to Admission medications   Medication Sig Start Date End Date Taking? Authorizing Provider  albuterol (PROVENTIL HFA;VENTOLIN HFA) 108 (90 Base) MCG/ACT inhaler Inhale 1 puff into the lungs every 6 (six) hours as needed for wheezing or shortness of breath. 01/27/18   Bridget Hartshorn L, PA-C  albuterol (PROVENTIL HFA;VENTOLIN HFA) 108 (90 Base) MCG/ACT inhaler Inhale 2 puffs into the lungs every 6 (six) hours as needed for wheezing or shortness of breath. 01/05/19   Darci Current, MD  alprazolam Prudy Feeler) 2 MG tablet Take 2 mg by mouth 4 (four) times daily.    [provider]  azithromycin (ZITHROMAX) 500 MG tablet Take 1 tablet (500 mg total) by mouth daily for 3 days. Take 1 tablet daily for 3 days. 01/05/19 01/08/19  Darci Current, MD  beclomethasone (QVAR) 80 MCG/ACT inhaler Inhale 2 puffs into the lungs every 6 (six) hours as needed (wheezing or shortness of breath).    [provider]  benzonatate (TESSALON PERLES) 100 MG capsule Take 2 capsules (200 mg total) by mouth 3 (three) times daily as needed. 01/27/18 01/27/19  Tommi Rumps, PA-C  benzonatate (TESSALON PERLES) 100 MG capsule Take 1 capsule (100 mg total) by mouth 3 (three) times daily as needed for up to 10 days for cough. 01/05/19 01/15/19  Darci Current, MD  butalbital-acetaminophen-caffeine (FIORICET, ESGIC) 50-325-40 MG tablet Take 1 tablet by mouth every 6 (six) hours as needed for headache. 01/27/18 01/27/19  Tommi RumpsSummers, Rhonda L, PA-C  EPINEPHrine (EPIPEN 2-PAK) 0.3 mg/0.3 mL IJ SOAJ injection Inject 0.3 mg into the muscle once as needed (allergic reaction).    [provider]  escitalopram (LEXAPRO) 20 MG tablet Take 20 mg by mouth daily.    [provider]  ibuprofen (ADVIL,MOTRIN) 800 MG tablet Take 800 mg by mouth every 8 (eight) hours as needed for mild pain or moderate pain.    [provider]  levothyroxine (SYNTHROID, LEVOTHROID) 300 MCG tablet Take 300 mcg by mouth daily.     [provider]  omeprazole (PRILOSEC) 40 MG capsule Take 40 mg by mouth 2 (two) times daily.    [provider]  predniSONE (DELTASONE) 20 MG tablet Take 2 tablets (40 mg total) by mouth daily for 5 days. 01/05/19 01/10/19  Darci CurrentBrown, Hart N, MD  promethazine (PHENERGAN) 12.5 MG tablet Take 1 tablet (12.5 mg total) by mouth every 6 (six) hours as needed for nausea or vomiting. 11/24/16   Willy Eddyobinson, Patrick, MD  simvastatin (ZOCOR) 20 MG tablet Take 20 mg by mouth every evening.    [provider]  tiotropium (SPIRIVA) 18 MCG inhalation capsule Place 18 mcg into inhaler and inhale daily.     [provider]  traZODone (DESYREL) 100 MG tablet Take 100 mg by mouth at bedtime as needed for sleep.    [provider]    Allergies Cyclobenzaprine; Esomeprazole magnesium; Metronidazole; Mobic [meloxicam]; Rofecoxib; Toradol [ketorolac tromethamine]; Tramadol; and Penicillins  History reviewed. No pertinent family history.  Social History Social History   Tobacco Use  . Smoking status: Current Every Day Smoker    Packs/day: 1.00    Years: 30.00    Pack years: 30.00  . Smokeless tobacco: Never Used  Substance Use Topics  . Alcohol use: No  . Drug use: Yes    Types: Marijuana    Review of Systems Constitutional: No fever/chills Eyes: No visual changes. ENT: Positive for sore throat. Cardiovascular: Denies chest pain. Respiratory: Denies shortness of breath.  Positive for cough Gastrointestinal: No abdominal pain.  No nausea, no vomiting.  No diarrhea.  No constipation. Genitourinary: Negative for dysuria. Musculoskeletal: Negative for neck pain.  Negative for back pain. Integumentary: Negative for rash. Neurological: Negative for headaches, focal weakness or numbness.  ____________________________________________   PHYSICAL EXAM:  VITAL SIGNS: ED Triage Vitals [01/04/19 1938]  Enc Vitals Group     BP 139/75     Pulse Rate 98     Resp (!)  21     Temp (!) 97.3 F (36.3 C)     Temp Source Oral     SpO2 95 %     Weight      Height      Head Circumference      Peak Flow      Pain Score      Pain Loc      Pain Edu?      Excl. in GC?     Constitutional: Alert and oriented. Well appearing and in no acute distress. Eyes: Conjunctivae are normal. Bilateral TM opacity with erythema. Mouth/Throat: Mucous membranes are moist. Oropharynx is erythematous Neck: No stridor.   Cardiovascular: Normal rate, regular rhythm. Good peripheral circulation. Grossly normal heart sounds. Respiratory: Normal respiratory effort.  No retractions. Lungs CTAB. Gastrointestinal: Soft and nontender. No distention.  Musculoskeletal: No lower extremity  tenderness nor edema. No gross deformities of extremities. Neurologic:  Normal speech and language. No gross focal neurologic deficits are appreciated.  Skin:  Skin is warm, dry and intact. No rash noted. Psychiatric: Mood and affect are normal. Speech and behavior are normal.  ____________________________________________   LABS (all labs ordered are listed, but only abnormal results are displayed)  Labs Reviewed  CBC - Abnormal; Notable for the following components:      Result Value   WBC 15.6 (*)    All other components within normal limits  BASIC METABOLIC PANEL - Abnormal; Notable for the following components:   Potassium 2.6 (*)    Glucose, Bld 123 (*)    All other components within normal limits  GROUP A STREP BY PCR  TROPONIN I   ____________________________________________  EKG  ED ECG REPORT I, [place] N Alecea Trego, the attending physician, personally viewed and interpreted this ECG.   Date: 01/05/2019  EKG Time: 7:38 PM  Rate: 94  Rhythm: Normal sinus rhythm  Axis: Normal  Intervals: Normal  ST&T Change: None  ____________________________________________  RADIOLOGY I, [place] N Aaliyah Gavel, personally viewed and evaluated these images (plain radiographs) as part of my  medical decision making, as well as reviewing the written report by the radiologist.  ED MD interpretation: Hyperinflation with mild bronchitic and emphysematous changes irregular opacity anterior over the lower chest on the lateral view per radiologist.  With recommendation for CT chest.  Official radiology report(s): Dg Chest 2 View  Result Date: 01/04/2019 CLINICAL DATA:  Shortness of breath EXAM: CHEST - 2 VIEW COMPARISON:  01/27/2018 FINDINGS: Hyperinflation with mild bronchitic changes. Irregular focus of opacity over the anterior lower lung on lateral view. Normal heart size. No pneumothorax. IMPRESSION: 1. Hyperinflation with mild bronchitic and emphysematous changes. 2. Irregular opacity anteriorly over the lower chest on the lateral view, lung nodule can not be excluded. Further evaluation with chest CT is suggested. Electronically Signed   By: Jasmine Pang M.D.   On: 01/04/2019 20:21   Ct Chest Wo Contrast  Result Date: 01/05/2019 CLINICAL DATA:  Cough and congestion. Possible lung nodule on radiograph. Current smoker. EXAM: CT CHEST WITHOUT CONTRAST TECHNIQUE: Multidetector CT imaging of the chest was performed following the standard protocol without IV contrast. COMPARISON:  Chest radiograph yesterday. FINDINGS: Cardiovascular: Mild aortic atherosclerosis. No aortic aneurysm. Heart is normal in size. No pericardial effusion. Mediastinum/Nodes: Shotty mediastinal nodes all less than 10 mm short axis. No bulky hilar adenopathy, detailed assessment limited in the absence of IV contrast. Esophagus is decompressed, possible small paraesophageal node adjacent to the distal esophagus. No visualized thyroid nodule. Lungs/Pleura: Multifocal tree in bud opacities within both lower lobes, right middle and right upper lobes. Apical prominent emphysema with central bronchial thickening. Small focal opacity in the anterior right upper lobe abuts the right heart border, images 79 and 80 series 3. 3 mm right  lower lobe pulmonary nodule image 75 series 3. 4 mm right lower lobe pulmonary nodule image 80 series 3. No pulmonary edema or pleural fluid. Upper Abdomen: No acute findings. Musculoskeletal: There are no acute or suspicious osseous abnormalities. IMPRESSION: 1. Multifocal tree in bud opacities throughout both lungs consistent with bronchiolitis. 2. Small pulmonary nodules in the right lower lobe, largest measuring 4 mm. Nonspecific ill-defined focal opacity in the anterior right upper lobe abutting the right heart border corresponds to radiographic abnormality, and is nonspecific, and may represent atelectasis, scarring or less likely ill-defined pulmonary nodule. CT follow-up is recommended. Initial CT  for follow up of right upper lobe opacity could be considered in 3 months to ensure resolution, other pulmonary nodules follow-up recommended in 12 months. 3.  Emphysema (ICD10-J43.9).  Bronchial thickening. Aortic Atherosclerosis (ICD10-I70.0). Electronically Signed   By: Narda Rutherford M.D.   On: 01/05/2019 02:47     Procedures   ____________________________________________   INITIAL IMPRESSION / MDM / ASSESSMENT AND PLAN / ED COURSE  As part of my medical decision making, I reviewed the following data within the electronic MEDICAL RECORD NUMBER 48 year old female present with above-stated history and physical exam concerning for pneumonia versus bronchitis sinusitis.  Chest x-ray performed revealed mild bronchitic and emphysematous changes with concern for irregular opacity over the anterior lower chest and as such CT chest was performed with above-stated findings.  Patient prescribed azithromycin prednisone albuterol and Tessalon the emergency department.  Regarding pulmonary nodules patient advised to follow-up with Dr. Lacie Scotts     ____________________________________________  FINAL CLINICAL IMPRESSION(S) / ED DIAGNOSES  Final diagnoses:  Acute bronchitis, unspecified organism  Acute  otitis media, unspecified otitis media type  Pulmonary nodule     MEDICATIONS GIVEN DURING THIS VISIT:  Medications  albuterol (PROVENTIL) (2.5 MG/3ML) 0.083% nebulizer solution 5 mg (5 mg Nebulization Not Given 01/05/19 0059)  ketorolac (TORADOL) tablet 10 mg (10 mg Oral Refused 01/05/19 0221)  benzonatate (TESSALON) capsule 100 mg (100 mg Oral Given 01/05/19 0059)  azithromycin (ZITHROMAX) tablet 500 mg (500 mg Oral Given 01/05/19 0101)  potassium chloride SA (K-DUR,KLOR-CON) CR tablet 40 mEq (40 mEq Oral Given 01/05/19 0101)  potassium chloride SA (K-DUR,KLOR-CON) CR tablet 40 mEq (40 mEq Oral Given 01/05/19 0101)  predniSONE (DELTASONE) tablet 60 mg (60 mg Oral Given 01/05/19 0100)     ED Discharge Orders         Ordered    azithromycin (ZITHROMAX) 500 MG tablet  Daily     01/05/19 0215    albuterol (PROVENTIL HFA;VENTOLIN HFA) 108 (90 Base) MCG/ACT inhaler  Every 6 hours PRN     01/05/19 0215    benzonatate (TESSALON PERLES) 100 MG capsule  3 times daily PRN     01/05/19 0215    predniSONE (DELTASONE) 20 MG tablet  Daily     01/05/19 0215           Note:  This document was prepared using Dragon voice recognition software and may include unintentional dictation errors.   Darci Current, MD 01/05/19 [phone]

## 2019-01-05 NOTE — ED Notes (Signed)
CT called

## 2019-01-05 NOTE — ED Notes (Signed)
ED Provider at bedside. 

## 2019-01-05 NOTE — ED Notes (Signed)
No answer twice through out the [employer]

## 2019-07-02 ENCOUNTER — Other Ambulatory Visit: Payer: Self-pay | Admitting: Oncology

## 2019-07-02 DIAGNOSIS — R55 Syncope and collapse: Secondary | ICD-10-CM | POA: Insufficient documentation

## 2019-07-02 DIAGNOSIS — E860 Dehydration: Secondary | ICD-10-CM | POA: Insufficient documentation

## 2019-07-02 DIAGNOSIS — E861 Hypovolemia: Secondary | ICD-10-CM | POA: Insufficient documentation

## 2019-07-02 DIAGNOSIS — J441 Chronic obstructive pulmonary disease with (acute) exacerbation: Secondary | ICD-10-CM | POA: Insufficient documentation

## 2019-07-02 DIAGNOSIS — R911 Solitary pulmonary nodule: Secondary | ICD-10-CM

## 2019-07-02 NOTE — Progress Notes (Signed)
  Pulmonary Nodule Clinic Telephone Note  Received referral from PCP, Dr. Vance Peper.  Patient presented to the emergency room on 01/05/2019 for complaints of cough and sore throat.  Had history of COPD and daily tobacco abuse.  Subsequently had chest x-ray and CT chest which revealed tree-in-bud opacities throughout both lungs consistent with bronchiolitis.  Found several small pulmonary nodules in the right lower lobe largest measuring 4 mm.  Recommended 59-month follow-up on pulmonary nodule.  I have personally reviewed patient's previous imaging: Chest x-ray from 01/27/2018 did not reveal any abnormalities except for hyperexpanded lungs consistent with COPD. Chest x-ray from 11/24/2016 did not reveal any active cardiopulmonary disease or abnormalities.  She is a current everyday smoker.  High risk factors include: History of heavy smoking, exposure to asbestos, radium or uranium, personal family history of lung cancer, older age, sex (females greater than males), race (black and native [place] greater than weight), marginal speculation, upper lobe location, multiplicity (less than 5 nodules increases risk for malignancy) and emphysema and/or pulmonary fibrosis.   This recommendation follows the consensus statement: Guidelines for Management of Incidental Pulmonary Nodules Detected on CT Images: From the Fleischner Society 2017; Radiology 2017; 284:228-243.    I have placed order for CT scan without contrast to be completed approximately 1 year from previous CT scan.  I would like her to see me virtually in our Pulmonary Nodule Clinic after her CT scan.   Scheduling has been notified of appointment request and will call patient with appointment   Faythe Casa, NP 12/17/2018 2:22 PM

## 2019-07-07 ENCOUNTER — Telehealth: Payer: Self-pay | Admitting: *Deleted

## 2019-07-07 NOTE — Telephone Encounter (Signed)
Referral received for pt to be seen in Lung Nodule Clinic for further workup of incidental lung nodule with follow up CT scan. Left message with patient to call back to discuss clinic and review recommendations and upcoming appts including follow up CT scan and visit with Jenny Burns, NP to discuss results. Awaiting call back.  

## 2019-12-22 ENCOUNTER — Telehealth: Payer: Self-pay | Admitting: *Deleted

## 2019-12-22 NOTE — Telephone Encounter (Signed)
Referral received for pt to be seen in Lung Nodule Clinic for further workup of incidental lung nodule with follow up CT scan. Left message with patient to call back to discuss clinic and review recommendations and upcoming appts including follow up CT scan and visit with Jenny Burns, NP to discuss results. Awaiting call back.  

## 2019-12-29 ENCOUNTER — Telehealth: Payer: Self-pay | Admitting: *Deleted

## 2019-12-29 NOTE — Telephone Encounter (Signed)
Unable to get in touch with patient by phone to review lung nodule clinic and upcoming appts. Appts mailed to pt and included contact info if has any further questions.

## 2020-01-05 ENCOUNTER — Telehealth: Payer: Self-pay | Admitting: *Deleted

## 2020-01-05 NOTE — Telephone Encounter (Signed)
Attempted to contact pt to remind of appts for the lung nodule clinic. Pt did not answer. Message left with patient. Instructed to call back if appts need to be changed.

## 2020-01-06 ENCOUNTER — Telehealth: Payer: Self-pay | Admitting: *Deleted

## 2020-01-06 ENCOUNTER — Ambulatory Visit: Admission: RE | Admit: 2020-01-06 | Payer: Medicaid Other | Source: Ambulatory Visit

## 2020-01-06 ENCOUNTER — Encounter: Payer: Self-pay | Admitting: *Deleted

## 2020-01-06 NOTE — Telephone Encounter (Signed)
Pt did not show for CT scan today after several failed attempts to contact her. PCP - Dr. Lacie Scotts - has been made aware. Pt discharged from lung nodule clinic at this time. PCP instructed to send another referral if pt desires follow up in the lung nodule clinic.

## 2020-01-07 ENCOUNTER — Inpatient Hospital Stay: Payer: Medicaid Other | Admitting: Oncology

## 2020-01-13 NOTE — Congregational Nurse Program (Signed)
Stated she is from [place] and has several appointments with the pulmonary specialist,neurologist and endocrinologist but not sure how she is going to get to [place]. Resident staffer stated provisions will be made for her after speaking to the different offices. At the present time he has no complaints of pain or discomfort Temp 97.6 BP 130/80  [address], [place], [phone]

## 2020-06-13 ENCOUNTER — Observation Stay
Admission: EM | Admit: 2020-06-13 | Discharge: 2020-06-17 | Disposition: A | Discharge status: Other Acute Inpatient Hospital | Payer: Medicaid - Out of State | Attending: Family Medicine | Admitting: Family Medicine

## 2020-06-13 ENCOUNTER — Emergency Department (EMERGENCY_DEPARTMENT_HOSPITAL): Payer: Medicaid - Out of State

## 2020-06-13 ENCOUNTER — Observation Stay (HOSPITAL_COMMUNITY): Payer: Medicaid - Out of State | Admitting: Family Medicine

## 2020-06-13 ENCOUNTER — Encounter (HOSPITAL_COMMUNITY): Payer: Self-pay

## 2020-06-13 ENCOUNTER — Other Ambulatory Visit: Payer: Self-pay

## 2020-06-13 DIAGNOSIS — R197 Diarrhea, unspecified: Secondary | ICD-10-CM | POA: Diagnosis present

## 2020-06-13 DIAGNOSIS — M545 Low back pain: Secondary | ICD-10-CM

## 2020-06-13 DIAGNOSIS — F329 Major depressive disorder, single episode, unspecified: Secondary | ICD-10-CM | POA: Insufficient documentation

## 2020-06-13 DIAGNOSIS — F419 Anxiety disorder, unspecified: Secondary | ICD-10-CM | POA: Insufficient documentation

## 2020-06-13 DIAGNOSIS — R1110 Vomiting, unspecified: Secondary | ICD-10-CM | POA: Diagnosis present

## 2020-06-13 DIAGNOSIS — N9489 Other specified conditions associated with female genital organs and menstrual cycle: Secondary | ICD-10-CM

## 2020-06-13 DIAGNOSIS — N83202 Unspecified ovarian cyst, left side: Secondary | ICD-10-CM | POA: Insufficient documentation

## 2020-06-13 DIAGNOSIS — N949 Unspecified condition associated with female genital organs and menstrual cycle: Secondary | ICD-10-CM

## 2020-06-13 DIAGNOSIS — E079 Disorder of thyroid, unspecified: Secondary | ICD-10-CM | POA: Diagnosis present

## 2020-06-13 DIAGNOSIS — E039 Hypothyroidism, unspecified: Secondary | ICD-10-CM | POA: Insufficient documentation

## 2020-06-13 DIAGNOSIS — Z9889 Other specified postprocedural states: Secondary | ICD-10-CM | POA: Diagnosis not present

## 2020-06-13 DIAGNOSIS — F32A Depression, unspecified: Secondary | ICD-10-CM | POA: Diagnosis present

## 2020-06-13 DIAGNOSIS — I700 Atherosclerosis of aorta: Secondary | ICD-10-CM

## 2020-06-13 DIAGNOSIS — R1032 Left lower quadrant pain: Secondary | ICD-10-CM

## 2020-06-13 DIAGNOSIS — E0580 Other thyrotoxicosis without thyrotoxic crisis or storm: Secondary | ICD-10-CM | POA: Diagnosis present

## 2020-06-13 DIAGNOSIS — R39198 Other difficulties with micturition: Secondary | ICD-10-CM | POA: Diagnosis present

## 2020-06-13 DIAGNOSIS — R1030 Lower abdominal pain, unspecified: Secondary | ICD-10-CM

## 2020-06-13 DIAGNOSIS — J449 Chronic obstructive pulmonary disease, unspecified: Secondary | ICD-10-CM | POA: Insufficient documentation

## 2020-06-13 DIAGNOSIS — I359 Nonrheumatic aortic valve disorder, unspecified: Secondary | ICD-10-CM | POA: Diagnosis present

## 2020-06-13 DIAGNOSIS — R160 Hepatomegaly, not elsewhere classified: Secondary | ICD-10-CM | POA: Insufficient documentation

## 2020-06-13 DIAGNOSIS — N83201 Unspecified ovarian cyst, right side: Secondary | ICD-10-CM | POA: Insufficient documentation

## 2020-06-13 DIAGNOSIS — G43909 Migraine, unspecified, not intractable, without status migrainosus: Secondary | ICD-10-CM | POA: Diagnosis present

## 2020-06-13 DIAGNOSIS — Z79899 Other long term (current) drug therapy: Secondary | ICD-10-CM | POA: Insufficient documentation

## 2020-06-13 DIAGNOSIS — R1319 Other dysphagia: Secondary | ICD-10-CM | POA: Diagnosis present

## 2020-06-13 DIAGNOSIS — R55 Syncope and collapse: Secondary | ICD-10-CM | POA: Diagnosis present

## 2020-06-13 DIAGNOSIS — K760 Fatty (change of) liver, not elsewhere classified: Secondary | ICD-10-CM

## 2020-06-13 DIAGNOSIS — Z79890 Hormone replacement therapy (postmenopausal): Secondary | ICD-10-CM | POA: Insufficient documentation

## 2020-06-13 DIAGNOSIS — Z90722 Acquired absence of ovaries, bilateral: Secondary | ICD-10-CM | POA: Diagnosis present

## 2020-06-13 DIAGNOSIS — I10 Essential (primary) hypertension: Secondary | ICD-10-CM | POA: Insufficient documentation

## 2020-06-13 DIAGNOSIS — I2510 Atherosclerotic heart disease of native coronary artery without angina pectoris: Secondary | ICD-10-CM | POA: Insufficient documentation

## 2020-06-13 DIAGNOSIS — F17210 Nicotine dependence, cigarettes, uncomplicated: Secondary | ICD-10-CM | POA: Insufficient documentation

## 2020-06-13 HISTORY — DX: Depression, unspecified: F32.A

## 2020-06-13 HISTORY — DX: Nonrheumatic aortic valve disorder, unspecified: I35.9

## 2020-06-13 HISTORY — DX: Syncope and collapse: R55

## 2020-06-13 HISTORY — DX: Migraine, unspecified, not intractable, without status migrainosus: G43.909

## 2020-06-13 HISTORY — DX: Anxiety disorder, unspecified: F41.9

## 2020-06-13 HISTORY — DX: Cerebral infarction, unspecified (CMS HCC): I63.9

## 2020-06-13 HISTORY — DX: Arthropathy, unspecified: M12.9

## 2020-06-13 HISTORY — DX: Essential (primary) hypertension: I10

## 2020-06-13 HISTORY — DX: Disorder of thyroid, unspecified: E07.9

## 2020-06-13 HISTORY — DX: Atherosclerotic heart disease of native coronary artery without angina pectoris: I25.10

## 2020-06-13 HISTORY — DX: Chronic obstructive pulmonary disease, unspecified (CMS HCC): J44.9

## 2020-06-13 LAB — CBC WITH DIFF
BASOPHIL #: <0.1 10*3/uL (ref <=0.20)
BASOPHIL %: 0 %
EOSINOPHIL #: <0.1 10*3/uL (ref <=0.50)
EOSINOPHIL %: 1 %
HCT: 45.2 % (ref 34.8–46.0)
HGB: 15.2 g/dL (ref 11.5–16.0)
IMMATURE GRANULOCYTE #: <0.1 10*3/uL (ref <0.10)
IMMATURE GRANULOCYTE %: 1 % (ref 0–1)
LYMPHOCYTE #: 3.97 10*3/uL (ref 1.00–4.80)
LYMPHOCYTE %: 28 %
MCH: 28.7 pg (ref 26.0–32.0)
MCHC: 33.6 g/dL (ref 31.0–35.5)
MCV: 85.3 fL (ref 78.0–100.0)
MONOCYTE #: 0.76 10*3/uL (ref 0.20–1.10)
MONOCYTE %: 5 %
MPV: 10.1 fL (ref 8.7–12.5)
NEUTROPHIL #: 9.26 10*3/uL — ABNORMAL HIGH (ref 1.50–7.70)
NEUTROPHIL %: 65 %
PLATELETS: 292 10*3/uL (ref 150–400)
RBC: 5.3 10*6/uL — ABNORMAL HIGH (ref 3.85–5.22)
RDW-CV: 12.1 % (ref 11.5–15.5)
WBC: 14.2 10*3/uL — ABNORMAL HIGH (ref 3.7–11.0)

## 2020-06-13 LAB — URINALYSIS, MACROSCOPIC
BILIRUBIN: NEGATIVE mg/dL
GLUCOSE: NORMAL mg/dL
KETONES: NEGATIVE mg/dL
LEUKOCYTES: NEGATIVE WBCs/uL
NITRITE: NEGATIVE
PH: 6 (ref 5.0–7.5)
PROTEIN: NEGATIVE mg/dL
SPECIFIC GRAVITY: 1.015 (ref 1.005–1.020)
UROBILINOGEN: <1 mg/dL (ref <=2.0)

## 2020-06-13 LAB — COMPREHENSIVE METABOLIC PANEL, NON-FASTING
ALBUMIN: 4.4 g/dL (ref 3.5–5.0)
ALKALINE PHOSPHATASE: 187 U/L — ABNORMAL HIGH (ref 40–110)
ALT (SGPT): 13 U/L (ref 8–22)
ANION GAP: 11 mmol/L (ref 4–13)
AST (SGOT): 14 U/L (ref 8–45)
BILIRUBIN TOTAL: 0.4 mg/dL (ref 0.3–1.3)
BUN/CREA RATIO: 14 (ref 6–22)
BUN: 8 mg/dL (ref 8–25)
CALCIUM: 10.1 mg/dL — ABNORMAL HIGH (ref 8.5–10.0)
CHLORIDE: 106 mmol/L (ref 96–111)
CO2 TOTAL: 23 mmol/L (ref 22–30)
CREATININE: 0.59 mg/dL — ABNORMAL LOW (ref 0.60–1.05)
ESTIMATED GFR: >90 mL/min/BSA (ref >=60)
GLUCOSE: 102 mg/dL (ref 65–125)
POTASSIUM: 3.8 mmol/L (ref 3.5–5.1)
PROTEIN TOTAL: 7.8 g/dL (ref 6.4–8.3)
SODIUM: 140 mmol/L (ref 136–145)

## 2020-06-13 LAB — URINALYSIS, MICROSCOPIC

## 2020-06-13 LAB — COVID-19 ~~LOC~~ MOLECULAR LAB TESTING: SARS-CoV-2: NOT DETECTED

## 2020-06-13 LAB — HCG, URINE QUALITATIVE, PREGNANCY: HCG URINE QUALITATIVE: NEGATIVE

## 2020-06-13 LAB — LIPASE: LIPASE: 24 U/L (ref 10–60)

## 2020-06-13 MED ORDER — SODIUM CHLORIDE 0.9 % IV BOLUS
1000.0000 mL | INJECTION | Status: AC
Start: 2020-06-13 — End: 2020-06-14
  Administered 2020-06-13: 1000 mL via INTRAVENOUS
  Administered 2020-06-14: 0 mL via INTRAVENOUS

## 2020-06-13 MED ORDER — ONDANSETRON HCL (PF) 4 MG/2 ML INJECTION SOLUTION
4.0000 mg | INTRAMUSCULAR | Status: AC
Start: 2020-06-13 — End: 2020-06-13
  Administered 2020-06-13: 4 mg via INTRAVENOUS
  Filled 2020-06-13: qty 2

## 2020-06-13 MED ORDER — SODIUM CHLORIDE 0.9 % (FLUSH) INJECTION SYRINGE
10.0000 mL | INJECTION | INTRAMUSCULAR | Status: DC | PRN
Start: 2020-06-13 — End: 2020-06-14
  Administered 2020-06-14: 10 mL via INTRAVENOUS

## 2020-06-13 MED ORDER — IOPAMIDOL 300 MG IODINE/ML (61 %) INTRAVENOUS SOLUTION
80.0000 mL | INTRAVENOUS | Status: AC
Start: 2020-06-13 — End: 2020-06-13
  Administered 2020-06-13: 23:00:00 80 mL via INTRAVENOUS
  Filled 2020-06-13: qty 100

## 2020-06-13 MED ORDER — IBUPROFEN 800 MG TABLET
800.0000 mg | ORAL_TABLET | ORAL | Status: DC
Start: 2020-06-13 — End: 2020-06-14
  Administered 2020-06-13: 0 mg via ORAL
  Filled 2020-06-13: qty 1

## 2020-06-13 MED ORDER — ACETAMINOPHEN 500 MG TABLET
1000.0000 mg | ORAL_TABLET | ORAL | Status: DC
Start: 2020-06-13 — End: 2020-06-16
  Administered 2020-06-13: 0 mg via ORAL
  Filled 2020-06-13: qty 2

## 2020-06-13 MED ORDER — SODIUM CHLORIDE 0.9 % (FLUSH) INJECTION SYRINGE
2.5000 mL | INJECTION | Freq: Three times a day (TID) | INTRAMUSCULAR | Status: DC
Start: 2020-06-13 — End: 2020-06-14

## 2020-06-13 MED ORDER — DIPHENHYDRAMINE 50 MG/ML INJECTION SOLUTION
50.0000 mg | INTRAMUSCULAR | Status: AC
Start: 2020-06-13 — End: 2020-06-13
  Administered 2020-06-13: 50 mg via INTRAVENOUS
  Filled 2020-06-13: qty 1

## 2020-06-13 MED ORDER — METOCLOPRAMIDE 5 MG/ML INJECTION SOLUTION
10.0000 mg | INTRAMUSCULAR | Status: AC
Start: 2020-06-13 — End: 2020-06-13
  Administered 2020-06-13: 10 mg via INTRAVENOUS
  Filled 2020-06-13: qty 2

## 2020-06-13 NOTE — ED Triage Notes (Signed)
Pt arrives via [facility] ambulance 4-2. Pt reports that she got dizzy, sat down and went to stand and slipped on her flip flops 5x days ago. Pt states she fell onto the stairs hitting her left side. Since the fall she states the pain has gotten worse. 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  Pain is 8/10. Pt able to ambualted to triage room.

## 2020-06-13 NOTE — ED Provider Notes (Signed)
[facility]  Emergency Department     HISTORY OF PRESENT ILLNESS     Date:  06/13/2020  Patient's Name:  Kara Barnes  Date of Birth:  14-Jan-1971    HPI   This is a 49 year old female that presents emergency department for multiple complaints.  States that she is having bilateral lower abdominal pain, bilateral lower back pain - ongoing for the past few days.  Also having nausea and vomiting multiple episodes ongoing for the past few weeks.  Diarrhea several episodes, several days ago which has since resolved.  Decreased urination several days. No fever. Positive headache.  No other acute symptoms at present.  No alleviating or aggravating factors noted.    Review of Systems     Review of Systems   All systems reviewed and negative except as above.     Previous History     Past Medical History:  Past Medical History:   Diagnosis Date    Anxiety     Aortic valve disorder     Arthropathy     Cervical dysphagia 1995    COPD (chronic obstructive pulmonary disease) (CMS HCC)     Coronary artery disease     CVA (cerebrovascular accident) (CMS HCC)     Depression     HTN (hypertension)     Migraine     PTSD (post-traumatic stress disorder)     Syncopal episodes     Thyroid disease        Past Surgical History:  Past Surgical History:   Procedure Laterality Date    Bilateral salpingoophorectomy      Dental surgery      Hx cesarean section      Hx cholecystectomy      Sinus surgery         Social History:  Social History     Tobacco Use    Smoking status: Current Every Day Smoker     Packs/day: 1.00    Smokeless tobacco: Never Used   Vaping Use    Vaping Use: Never used   Substance Use Topics    Alcohol use: Not Currently    Drug use: Yes     Types: Marijuana     Comment: 1 every 2 weeks     Social History     Substance and Sexual Activity   Drug Use Yes    Types: Marijuana    Comment: 1 every 2 weeks       Family History:  No family history on file.    Medication  History:  No current outpatient medications on file.       Allergies:  Allergies   Allergen Reactions    Penicillins Rash    Ultram [Tramadol] Rash    Flagyl [Metronidazole]  Other Adverse Reaction (Add comment)     Migraine       Flexeril [Cyclobenzaprine]  Other Adverse Reaction (Add comment)     Migraine      Toradol [Ketorolac]  Other Adverse Reaction (Add comment)     Migraine         Physical Exam     Vitals:    BP (!) 136/56    Pulse 81    Temp 36 C (96.8 F)    Resp 16    Ht 1.499 m ('4\' 11"'$ )    Wt 59 kg (130 lb)    LMP  (LMP Unknown) Comment: postmenopausal    SpO2 95%    BMI 26.26  kg/m     Physical Exam   Constitutional: Awake, alert, well-nourished, appears uncomfortable.  Nontoxic.  HENT: Normocephalic and atraumatic.   Eyes: Normal conjunctiva. EOMI.   Neck: Normal range of motion. Neck supple.   Cardiovascular: Normal rate, regular rhythm and normal heart sounds.   Respiratory: Clear to auscultation BL, No respiratory distress. No wheezing, rales, or rhonchi.  GI: Soft, nondistended, tenderness to palpation bilateral lower abdomen, left greater than right.  No rebound or guarding.  Musculoskeletal:  Tender to palpation generalized back upper and lower, worse to bilateral lower back.  Neurological: AAOx3, No acute neuro deficits.   Skin: Warm, dry, intact.   Psychiatric:  Anxious.  Nursing note and vitals reviewed.    Diagnostic Studies/Treatment     Medications:  Medications   ondansetron (ZOFRAN) 2 mg/mL injection (4 mg Intravenous Given 06/13/20 2222)   NS bolus infusion 1,000 mL (1,000 mL Intravenous New Bag/New Syringe 06/13/20 2223)   iopamidol (ISOVUE-300) 61% infusion (80 mL Intravenous Given 06/13/20 2326)   metoclopramide (REGLAN) 5 mg/mL injection (10 mg Intravenous Given 06/13/20 2240)   diphenhydrAMINE (BENADRYL) 50 mg/mL injection (50 mg Intravenous Given 06/13/20 2240)   morphine 4 mg/mL injection (has no administration in time range)       Discharge Medication List as of 06/17/2020 11:02 AM           Labs:    Results for orders placed or performed during the hospital encounter of 06/13/20   COMPREHENSIVE METABOLIC PANEL, NON-FASTING   Result Value Ref Range    SODIUM 140 136 - 145 mmol/L    POTASSIUM 3.8 3.5 - 5.1 mmol/L    CHLORIDE 106 96 - 111 mmol/L    CO2 TOTAL 23 22 - 30 mmol/L    ANION GAP 11 4 - 13 mmol/L    BUN 8 8 - 25 mg/dL    CREATININE 0.59 (L) 0.60 - 1.05 mg/dL    BUN/CREA RATIO 14 6 - 22    ESTIMATED GFR >90 >=60 mL/min/BSA    ALBUMIN 4.4 3.5 - 5.0 g/dL     CALCIUM 10.1 (H) 8.5 - 10.0 mg/dL    GLUCOSE 102 65 - 125 mg/dL    ALKALINE PHOSPHATASE 187 (H) 40 - 110 U/L    ALT (SGPT) 13 8 - 22 U/L    AST (SGOT)  14 8 - 45 U/L    BILIRUBIN TOTAL 0.4 0.3 - 1.3 mg/dL    PROTEIN TOTAL 7.8 6.4 - 8.3 g/dL   LIPASE   Result Value Ref Range    LIPASE 24 10 - 60 U/L   COVID-19 SCREENING (COVID only)   Result Value Ref Range    SARS-CoV-2 Not Detected Not Detected   CBC WITH DIFF   Result Value Ref Range    WBC 14.2 (H) 3.7 - 11.0 x103/uL    RBC 5.30 (H) 3.85 - 5.22 x106/uL    HGB 15.2 11.5 - 16.0 g/dL    HCT 45.2 34.8 - 46.0 %    MCV 85.3 78.0 - 100.0 fL    MCH 28.7 26.0 - 32.0 pg    MCHC 33.6 31.0 - 35.5 g/dL    RDW-CV 12.1 11.5 - 15.5 %    PLATELETS 292 150 - 400 x103/uL    MPV 10.1 8.7 - 12.5 fL    NEUTROPHIL % 65 %    LYMPHOCYTE % 28 %    MONOCYTE % 5 %    EOSINOPHIL % 1 %  BASOPHIL % 0 %    NEUTROPHIL # 9.26 (H) 1.50 - 7.70 x103/uL    LYMPHOCYTE # 3.97 1.00 - 4.80 x103/uL    MONOCYTE # 0.76 0.20 - 1.10 x103/uL    EOSINOPHIL # <0.10 <=0.50 x103/uL    BASOPHIL # <0.10 <=0.20 x103/uL    IMMATURE GRANULOCYTE % 1 0 - 1 %    IMMATURE GRANULOCYTE # <0.10 <0.10 x103/uL   URINALYSIS, MACROSCOPIC   Result Value Ref Range    COLOR Yellow Yellow    APPEARANCE Clear Clear    PH 6.0 5.0 - 7.5    LEUKOCYTES Negative Negative WBCs/uL    NITRITE Negative Negative    PROTEIN Negative Negative mg/dL    GLUCOSE Normal Normal mg/dL    KETONES Negative Negative mg/dL    UROBILINOGEN < 1 <=2.0 mg/dL     BILIRUBIN Negative Negative mg/dL    BLOOD 1+ (A) Negative mg/dL    SPECIFIC GRAVITY 1.015 1.005 - 1.020   HCG, URINE QUALITATIVE, PREGNANCY   Result Value Ref Range    HCG URINE QUALITATIVE Negative Negative   URINALYSIS, MICROSCOPIC   Result Value Ref Range    RBCS 0-2 0-2, None /hpf    WBCS None 0-2, 3-5, None /hpf    BACTERIA None None /hpf    SQUAMOUS EPITHELIAL 0-2 0-2, None /hpf   COMPREHENSIVE METABOLIC PANEL, NON-FASTING   Result Value Ref Range    SODIUM 144 136 - 145 mmol/L    POTASSIUM 3.5 3.5 - 5.1 mmol/L    CHLORIDE 110 96 - 111 mmol/L    CO2 TOTAL 26 22 - 30 mmol/L    ANION GAP 8 4 - 13 mmol/L    BUN 6 (L) 8 - 25 mg/dL    CREATININE 0.52 (L) 0.60 - 1.05 mg/dL    BUN/CREA RATIO 12 6 - 22    ESTIMATED GFR >90 >=60 mL/min/BSA    ALBUMIN 3.6 3.5 - 5.0 g/dL     CALCIUM 9.6 8.5 - 10.0 mg/dL    GLUCOSE 86 65 - 125 mg/dL    ALKALINE PHOSPHATASE 175 (H) 40 - 110 U/L    ALT (SGPT) 82 (H) 8 - 22 U/L    AST (SGOT)  175 (H) 8 - 45 U/L    BILIRUBIN TOTAL 0.4 0.3 - 1.3 mg/dL    PROTEIN TOTAL 6.5 6.4 - 8.3 g/dL   CBC WITH DIFF   Result Value Ref Range    WBC 7.8 3.7 - 11.0 x103/uL    RBC 4.82 3.85 - 5.22 x106/uL    HGB 13.5 11.5 - 16.0 g/dL    HCT 41.4 34.8 - 46.0 %    MCV 85.9 78.0 - 100.0 fL    MCH 28.0 26.0 - 32.0 pg    MCHC 32.6 31.0 - 35.5 g/dL    RDW-CV 12.2 11.5 - 15.5 %    PLATELETS 203 150 - 400 x103/uL    MPV 10.0 8.7 - 12.5 fL    NEUTROPHIL % 60 %    LYMPHOCYTE % 32 %    MONOCYTE % 7 %    EOSINOPHIL % 1 %    BASOPHIL % 0 %    NEUTROPHIL # 4.63 1.50 - 7.70 x103/uL    LYMPHOCYTE # 2.50 1.00 - 4.80 x103/uL    MONOCYTE # 0.54 0.20 - 1.10 x103/uL    EOSINOPHIL # <0.10 <=0.50 x103/uL    BASOPHIL # <0.10 <=0.20 x103/uL    IMMATURE GRANULOCYTE % 0 0 -  1 %    IMMATURE GRANULOCYTE # <0.10 <0.10 x103/uL   C-REACTIVE PROTEIN(CRP),INFLAMMATION   Result Value Ref Range    CRP INFLAMMATION 5.3 <8.0 mg/L   SEDIMENTATION RATE   Result Value Ref Range    ERYTHROCYTE SEDIMENTATION RATE (ESR) 7 0 - 20 mm/hr   GAMMA  GT   Result Value Ref Range    GGT 316 (H) 7 - 50 U/L   HEPATITIS A (HAV) IGG ANTIBODY   Result Value Ref Range    HAV IGG ANTIBODY Negative Negative   HEPATITIS A IGM ANTIBODY   Result Value Ref Range    HAV IGM Negative Negative   HEPATITIS B CORE IGM, AB   Result Value Ref Range    HBV CORE IGM ANTIBODY QUALITATIVE Negative Negative   HEPATITIS B SURFACE ANTIBODY   Result Value Ref Range    HBV SURFACE ANTIBODY QUANTITATIVE 1 <8 mIU/mL    HEPATITIS B SURFACE ANTIGEN   Result Value Ref Range    HBV SURFACE ANTIGEN QUALITATIVE Negative Negative   HEPATITIS C ANTIBODY SCREEN WITH REFLEX TO HCV PCR   Result Value Ref Range    HCV ANTIBODY QUALITATIVE Equivocal (A) Negative   LACTIC ACID LEVEL W/ REFLEX FOR LEVEL >2.0   Result Value Ref Range    LACTIC ACID 1.4 0.5 - 2.2 mmol/L   CA 125   Result Value Ref Range    CA 125 30.4 <35.0 U/mL   COMPREHENSIVE METABOLIC PANEL, NON-FASTING   Result Value Ref Range    SODIUM 142 136 - 145 mmol/L    POTASSIUM 3.4 (L) 3.5 - 5.1 mmol/L    CHLORIDE 108 96 - 111 mmol/L    CO2 TOTAL 25 22 - 30 mmol/L    ANION GAP 9 4 - 13 mmol/L    BUN 9 8 - 25 mg/dL    CREATININE 0.55 (L) 0.60 - 1.05 mg/dL    BUN/CREA RATIO 16 6 - 22    ESTIMATED GFR >90 >=60 mL/min/BSA    ALBUMIN 3.6 3.5 - 5.0 g/dL     CALCIUM 9.7 8.5 - 10.0 mg/dL    GLUCOSE 105 65 - 125 mg/dL    ALKALINE PHOSPHATASE 190 (H) 40 - 110 U/L    ALT (SGPT) 88 (H) 8 - 22 U/L    AST (SGOT)  123 (H) 8 - 45 U/L    BILIRUBIN TOTAL 0.7 0.3 - 1.3 mg/dL    PROTEIN TOTAL 6.5 6.4 - 8.3 g/dL   HEPATITIS C ANTIBODY SCREEN WITH REFLEX TO HCV PCR   Result Value Ref Range    HCV ANTIBODY QUALITATIVE Equivocal (A) Negative   HIV1/HIV2 SCREEN, COMBINED ANTIGEN AND ANTIBODY   Result Value Ref Range    HIV SCREEN, COMBINED ANTIGEN & ANTIBODY Negative Negative   HPV HIGH RISK REFLEX TO GENOTYPE - [facility] ONLY   Result Value Ref Range    HPV RNA PCR Positive (A) Negative   COMPREHENSIVE METABOLIC PNL, FASTING   Result Value Ref Range    SODIUM 143 136 - 145  mmol/L    POTASSIUM 3.9 3.5 - 5.1 mmol/L    CHLORIDE 108 96 - 111 mmol/L    CO2 TOTAL 25 22 - 30 mmol/L    ANION GAP 10 4 - 13 mmol/L    BUN 6 (L) 8 - 25 mg/dL    CREATININE 0.53 (L) 0.60 - 1.05 mg/dL    BUN/CREA RATIO 11 6 - 22    ESTIMATED GFR >90 >=60  mL/min/BSA    ALBUMIN 3.4 (L) 3.5 - 5.0 g/dL     CALCIUM 9.8 8.5 - 10.0 mg/dL    GLUCOSE 117 (H) 70 - 99 mg/dL    ALKALINE PHOSPHATASE 203 (H) 40 - 110 U/L    ALT (SGPT) 139 (H) 8 - 22 U/L    AST (SGOT)  75 (H) 8 - 45 U/L    BILIRUBIN TOTAL 0.4 0.3 - 1.3 mg/dL    PROTEIN TOTAL 6.2 (L) 6.4 - 8.3 g/dL   CBC WITH DIFF   Result Value Ref Range    WBC 9.0 3.7 - 11.0 x103/uL    RBC 4.42 3.85 - 5.22 x106/uL    HGB 12.6 11.5 - 16.0 g/dL    HCT 38.2 34.8 - 46.0 %    MCV 86.4 78.0 - 100.0 fL    MCH 28.5 26.0 - 32.0 pg    MCHC 33.0 31.0 - 35.5 g/dL    RDW-CV 12.0 11.5 - 15.5 %    PLATELETS 206 150 - 400 x103/uL    MPV 9.8 8.7 - 12.5 fL    NEUTROPHIL % 83 %    LYMPHOCYTE % 13 %    MONOCYTE % 3 %    EOSINOPHIL % 0 %    BASOPHIL % 0 %    NEUTROPHIL # 7.53 1.50 - 7.70 x103/uL    LYMPHOCYTE # 1.13 1.00 - 4.80 x103/uL    MONOCYTE # 0.29 0.20 - 1.10 x103/uL    EOSINOPHIL # <0.10 <=0.50 x103/uL    BASOPHIL # <0.10 <=0.20 x103/uL    IMMATURE GRANULOCYTE % 1 0 - 1 %    IMMATURE GRANULOCYTE # <0.10 <0.10 x103/uL   THYROID STIMULATING HORMONE WITH FREE T4 REFLEX   Result Value Ref Range    TSH <0.008 (L) 0.430 - 3.550 uIU/mL   THYROXINE, FREE (FREE T4)   Result Value Ref Range    THYROXINE (T4), FREE 1.99 (H) 0.70 - 1.25 ng/dL   COMPREHENSIVE METABOLIC PANEL, NON-FASTING   Result Value Ref Range    SODIUM 143 136 - 145 mmol/L    POTASSIUM 3.5 3.5 - 5.1 mmol/L    CHLORIDE 110 96 - 111 mmol/L    CO2 TOTAL 26 22 - 30 mmol/L    ANION GAP 7 4 - 13 mmol/L    BUN 14 8 - 25 mg/dL    CREATININE 0.52 (L) 0.60 - 1.05 mg/dL    BUN/CREA RATIO 27 (H) 6 - 22    ESTIMATED GFR >90 >=60 mL/min/BSA    ALBUMIN 3.3 (L) 3.5 - 5.0 g/dL     CALCIUM 9.2 8.5 - 10.0 mg/dL    GLUCOSE 96 65 - 125 mg/dL     ALKALINE PHOSPHATASE 179 (H) 40 - 110 U/L    ALT (SGPT) 84 (H) 8 - 22 U/L    AST (SGOT)  27 8 - 45 U/L    BILIRUBIN TOTAL 0.3 0.3 - 1.3 mg/dL    PROTEIN TOTAL 6.0 (L) 6.4 - 8.3 g/dL   VITAMIN D 25 TOTAL   Result Value Ref Range    VITAMIN D 25, TOTAL 19.6 (L) 30.0 - 100.0 ng/mL   LIPID PANEL   Result Value Ref Range    CHOLESTEROL  160 100 - 200 mg/dL    HDL CHOL 39 (L) >=50 mg/dL    TRIGLYCERIDES 235 (H) <150 mg/dL    LDL CALC 74 <100 mg/dL    VLDL CALC 47 (H) <30 mg/dL    NON-HDL  121 <=190 mg/dL    CHOL/HDL RATIO 4.1    CBC WITH DIFF   Result Value Ref Range    WBC 8.6 3.7 - 11.0 x103/uL    RBC 4.58 3.85 - 5.22 x106/uL    HGB 12.2 11.5 - 16.0 g/dL    HCT 41.3 34.8 - 46.0 %    MCV 90.2 78.0 - 100.0 fL    MCH 26.6 26.0 - 32.0 pg    MCHC 29.5 (L) 31.0 - 35.5 g/dL    RDW-CV 13.2 11.5 - 15.5 %    PLATELETS 203 150 - 400 x103/uL    MPV 10.2 8.7 - 12.5 fL    NEUTROPHIL % 56 %    LYMPHOCYTE % 38 %    MONOCYTE % 5 %    EOSINOPHIL % 0 %    BASOPHIL % 0 %    NEUTROPHIL # 4.73 1.50 - 7.70 x103/uL    LYMPHOCYTE # 3.25 1.00 - 4.80 x103/uL    MONOCYTE # 0.45 0.20 - 1.10 x103/uL    EOSINOPHIL # <0.10 <=0.50 x103/uL    BASOPHIL # <0.10 <=0.20 x103/uL    IMMATURE GRANULOCYTE % 1 0 - 1 %    IMMATURE GRANULOCYTE # <0.10 <0.10 x103/uL   TYPE AND SCREEN   Result Value Ref Range    UNITS ORDERED NOT STATED         ABO/RH(D) O POSITIVE     ANTIBODY SCREEN NEGATIVE     SPECIMEN EXPIRATION DATE 06/18/2020    ABO & RH   Result Value Ref Range    ABO/RH(D) O POSITIVE    SURGICAL PATHOLOGY SPECIMEN   Result Value Ref Range    Final Diagnosis       A.  RIGHT OVARY WITH CYST AND PORTION OF FALLOPIAN TUBE, SALPINGO-OOPHORECTOMY:  - Benign serous cystadenoma.  - Distal segment of benign fimbriated fallopian tube.  - Paratubal cyst.    B.  LEFT OVARY WITH CYST AND PORTION OF FALLOPIAN TUBE, SALPINGO-OOPHORECTOMY:  - Benign serous cystadenoma.  - Distal segment of benign fimbriated fallopian tube      Microscopic Description        Performed.      Gross Description       A. Ovary.  Part A received labeled "right ovary and cyst and cyst fluid with portion of fallopian tube" consists of 28 g, 8.0 x 8.0 x 2.0 cm previously opened tan-white ovarian cyst.  The outer surface is blue-purple to white, smooth with no papillary excrescences.  Sectioning reveals the cyst is multiloculated with tan-white smooth inner surfaces and no papillary excrescences grossly identified. Minimal possible residual ovarian stroma is grossly identified.  The cyst wall averages 0.2 cm.     Additionally received in the container is a 2.0 x 1.0 x 0 cm portion of distal fallopian tube fimbriated end with minimal possible tube grossly identified. Representative sections are submitted as follows:  A1:          ovary cyst wall  A2:          possible residual ovarian stroma and additionally received fimbriated end    B. Ovary.  Part B received labeled "left ovary and cyst and fluid with portion of fallopian tube" consists of a 10 g, 5.5 x 5.0 x 2.4 cm previously opened tan-white smooth lined ovarian cyst.  The outer surface is tan-white and smooth with no papillary excrescences grossly identified.  Sectioning reveals the cyst is multiloculated and contains straw colored serous  fluid. The internal surface is red-white, smooth with no papillary excrescences grossly identified. The cyst wall ranges from 0.1 - 0.3 cm. Additionally received in the container is a 2.0 cm in length x 0.6 cm in diameter blue-purple, smooth, fimbriated distal portion of fallopian tube. Representative sections are submitted as follows:  B1:          ovarian cyst wall  B2:          fallopian tube to include 1/2 fimbriated end       CYTOPATHOLOGY, NON GYN   Result Value Ref Range    Interpretation       A. Peritoneal fluid from peritoneal washing  Peritoneal Fluid  Satisfactory for evaluation.  NEGATIVE FOR MALIGNANT CELLS.          Microscopic Description       Mesothelial cells.  Lymphocytes.      Gross  Description       A. Peritoneal Fluid.  Red, turbid, 67m      Embedded Images         Radiology:  CT ABDOMEN PELVIS W IV CONTRAST  [place]LV[place] NON OB TRANSABDOMINAL/TRANSVAGINAL  [place]IVER  OR SCOPE GYN    [place]IVER   Final Result   Hepatomegaly and steatosis      Status post cholecystectomy and prominent common bile duct.         COMPARISON:      FINDINGS:      IMPRESSION:            Radiologist location ID: [id number]        OR SCOPE GYN   Final Result      [place]PELVIS NON OB TRANSABDOMINAL/TRANSVAGINAL   Final Result   Both ovaries are enlarged and contain a septated cyst each as described   above. Follow-up is suggested            Radiologist location ID: [id number]        CT ABDOMEN PELVIS W IV CONTRAST   Final Result      1. Bilateral complex adnexal/possibly ovarian origin masses with internal   multicystic change and enhancing thin septations. Follow-up nonemergent   pelvic ultrasound for further characterization is recommended. Due to size,   these may also require contrast-enhanced MRI of the pelvis.   2. More than expected intra-abdominal aorta and branch vessel   atherosclerotic disease for patient's age. There is narrowing involving the   celiac origin. Probable fibrofatty plaque or less likely subocclusive   thrombus involving the SMA origin.   3. No evidence of urinary or bowel obstruction.   4. Mild hepatomegaly. Borderline fatty liver.               Radiologist location ID: [id number]            ECG:  NONE      Procedure     Procedures    Course/Disposition/Plan     Course:  20277A Labs show mild leukocytosis, otherwise generally unremarkable. UA WNL. CT pending. Care is transitioned to Dr. LElroy Channelwho will f/u on the previous and dispo patient.    Disposition:    Admitted    Condition at Disposition:   Stable      Follow up:   No follow-up provider specified.    Clinical Impression:     Encounter Diagnosis   Name Primary?    Adnexal mass Yes       Future Appointments Scheduled in Epic:  No future  appointments.

## 2020-06-13 NOTE — ED Nurses Note (Signed)
Pt refusing tylenol and ibuprofen, stating "they don't work". Provider aware.

## 2020-06-14 ENCOUNTER — Observation Stay (HOSPITAL_BASED_OUTPATIENT_CLINIC_OR_DEPARTMENT_OTHER): Payer: Medicaid - Out of State

## 2020-06-14 ENCOUNTER — Other Ambulatory Visit: Payer: Self-pay

## 2020-06-14 ENCOUNTER — Encounter (HOSPITAL_COMMUNITY): Payer: Self-pay | Admitting: Family Medicine

## 2020-06-14 DIAGNOSIS — F329 Major depressive disorder, single episode, unspecified: Secondary | ICD-10-CM | POA: Diagnosis present

## 2020-06-14 DIAGNOSIS — E0580 Other thyrotoxicosis without thyrotoxic crisis or storm: Secondary | ICD-10-CM | POA: Diagnosis present

## 2020-06-14 DIAGNOSIS — I2510 Atherosclerotic heart disease of native coronary artery without angina pectoris: Secondary | ICD-10-CM | POA: Diagnosis present

## 2020-06-14 DIAGNOSIS — N83202 Unspecified ovarian cyst, left side: Secondary | ICD-10-CM

## 2020-06-14 DIAGNOSIS — N83201 Unspecified ovarian cyst, right side: Secondary | ICD-10-CM

## 2020-06-14 DIAGNOSIS — J449 Chronic obstructive pulmonary disease, unspecified: Secondary | ICD-10-CM | POA: Diagnosis present

## 2020-06-14 DIAGNOSIS — G43909 Migraine, unspecified, not intractable, without status migrainosus: Secondary | ICD-10-CM | POA: Diagnosis present

## 2020-06-14 DIAGNOSIS — F32A Depression, unspecified: Secondary | ICD-10-CM | POA: Diagnosis present

## 2020-06-14 DIAGNOSIS — R1110 Vomiting, unspecified: Secondary | ICD-10-CM | POA: Diagnosis present

## 2020-06-14 DIAGNOSIS — I10 Essential (primary) hypertension: Secondary | ICD-10-CM | POA: Diagnosis present

## 2020-06-14 DIAGNOSIS — R39198 Other difficulties with micturition: Secondary | ICD-10-CM | POA: Diagnosis present

## 2020-06-14 DIAGNOSIS — R197 Diarrhea, unspecified: Secondary | ICD-10-CM | POA: Diagnosis present

## 2020-06-14 DIAGNOSIS — I359 Nonrheumatic aortic valve disorder, unspecified: Secondary | ICD-10-CM | POA: Diagnosis present

## 2020-06-14 DIAGNOSIS — E079 Disorder of thyroid, unspecified: Secondary | ICD-10-CM | POA: Diagnosis present

## 2020-06-14 DIAGNOSIS — R55 Syncope and collapse: Secondary | ICD-10-CM | POA: Diagnosis present

## 2020-06-14 DIAGNOSIS — F419 Anxiety disorder, unspecified: Secondary | ICD-10-CM | POA: Diagnosis present

## 2020-06-14 LAB — CBC WITH DIFF
BASOPHIL #: <0.1 10*3/uL (ref <=0.20)
BASOPHIL %: 0 %
EOSINOPHIL #: <0.1 10*3/uL (ref <=0.50)
EOSINOPHIL %: 1 %
HCT: 41.4 % (ref 34.8–46.0)
HGB: 13.5 g/dL (ref 11.5–16.0)
IMMATURE GRANULOCYTE #: <0.1 10*3/uL (ref <0.10)
IMMATURE GRANULOCYTE %: 0 % (ref 0–1)
LYMPHOCYTE #: 2.5 10*3/uL (ref 1.00–4.80)
LYMPHOCYTE %: 32 %
MCH: 28 pg (ref 26.0–32.0)
MCHC: 32.6 g/dL (ref 31.0–35.5)
MCV: 85.9 fL (ref 78.0–100.0)
MONOCYTE #: 0.54 10*3/uL (ref 0.20–1.10)
MONOCYTE %: 7 %
MPV: 10 fL (ref 8.7–12.5)
NEUTROPHIL #: 4.63 10*3/uL (ref 1.50–7.70)
NEUTROPHIL %: 60 %
PLATELETS: 203 10*3/uL (ref 150–400)
RBC: 4.82 10*6/uL (ref 3.85–5.22)
RDW-CV: 12.2 % (ref 11.5–15.5)
WBC: 7.8 10*3/uL (ref 3.7–11.0)

## 2020-06-14 LAB — COMPREHENSIVE METABOLIC PANEL, NON-FASTING
ALBUMIN: 3.6 g/dL (ref 3.5–5.0)
ALKALINE PHOSPHATASE: 175 U/L — ABNORMAL HIGH (ref 40–110)
ALT (SGPT): 82 U/L — ABNORMAL HIGH (ref 8–22)
ANION GAP: 8 mmol/L (ref 4–13)
AST (SGOT): 175 U/L — ABNORMAL HIGH (ref 8–45)
BILIRUBIN TOTAL: 0.4 mg/dL (ref 0.3–1.3)
BUN/CREA RATIO: 12 (ref 6–22)
BUN: 6 mg/dL — ABNORMAL LOW (ref 8–25)
CALCIUM: 9.6 mg/dL (ref 8.5–10.0)
CHLORIDE: 110 mmol/L (ref 96–111)
CO2 TOTAL: 26 mmol/L (ref 22–30)
CREATININE: 0.52 mg/dL — ABNORMAL LOW (ref 0.60–1.05)
ESTIMATED GFR: >90 mL/min/BSA (ref >=60)
GLUCOSE: 86 mg/dL (ref 65–125)
POTASSIUM: 3.5 mmol/L (ref 3.5–5.1)
PROTEIN TOTAL: 6.5 g/dL (ref 6.4–8.3)
SODIUM: 144 mmol/L (ref 136–145)

## 2020-06-14 LAB — LACTIC ACID LEVEL W/ REFLEX FOR LEVEL >2.0: LACTIC ACID: 1.4 mmol/L (ref 0.5–2.2)

## 2020-06-14 LAB — HEPATITIS B SURFACE ANTIGEN: HBV SURFACE ANTIGEN QUALITATIVE: NEGATIVE

## 2020-06-14 LAB — CA 125: CA 125: 30.4 U/mL (ref <35.0)

## 2020-06-14 LAB — HEPATITIS B CORE IGM, AB: HBV CORE IGM ANTIBODY QUALITATIVE: NEGATIVE

## 2020-06-14 LAB — HEPATITIS A (HAV) IGG ANTIBODY: HAV IGG ANTIBODY: NEGATIVE

## 2020-06-14 LAB — GAMMA GT: GGT: 316 U/L — ABNORMAL HIGH (ref 7–50)

## 2020-06-14 LAB — HEPATITIS A (HAV) IGM ANTIBODY: HAV IGM: NEGATIVE

## 2020-06-14 LAB — C-REACTIVE PROTEIN(CRP),INFLAMMATION: CRP INFLAMMATION: 5.3 mg/L (ref <8.0)

## 2020-06-14 LAB — HEPATITIS C ANTIBODY SCREEN WITH REFLEX TO HCV PCR: HCV ANTIBODY QUALITATIVE: UNDETERMINED — AB

## 2020-06-14 LAB — HEPATITIS B SURFACE ANTIBODY: HBV SURFACE ANTIBODY QUANTITATIVE: 1 m[IU]/mL (ref <8)

## 2020-06-14 LAB — SEDIMENTATION RATE: ERYTHROCYTE SEDIMENTATION RATE (ESR): 7 mm/hr (ref 0–20)

## 2020-06-14 MED ORDER — MORPHINE 4 MG/ML INTRAVENOUS SYRINGE
4.0000 mg | INJECTION | INTRAVENOUS | Status: AC
Start: 2020-06-14 — End: 2020-06-14
  Administered 2020-06-14: 4 mg via INTRAVENOUS
  Filled 2020-06-14: qty 1

## 2020-06-14 MED ORDER — LORAZEPAM 2 MG/ML INJECTION SOLUTION
4.0000 mg | INTRAMUSCULAR | Status: AC
Start: 2020-06-14 — End: 2020-06-14
  Administered 2020-06-14: 2 mg via INTRAVENOUS
  Filled 2020-06-14 (×2): qty 1

## 2020-06-14 MED ORDER — IBUPROFEN 800 MG TABLET
800.0000 mg | ORAL_TABLET | Freq: Four times a day (QID) | ORAL | Status: DC | PRN
Start: 2020-06-14 — End: 2020-06-17
  Administered 2020-06-14 – 2020-06-16 (×7): 800 mg via ORAL
  Filled 2020-06-14 (×7): qty 1

## 2020-06-14 MED ORDER — CALCIUM 200 MG (AS CALCIUM CARBONATE 500 MG) CHEWABLE TABLET
500.0000 mg | CHEWABLE_TABLET | Freq: Three times a day (TID) | ORAL | Status: DC | PRN
Start: 2020-06-14 — End: 2020-06-16

## 2020-06-14 MED ORDER — DIPHENHYDRAMINE 50 MG/ML INJECTION SOLUTION
25.0000 mg | Freq: Four times a day (QID) | INTRAMUSCULAR | Status: DC | PRN
Start: 2020-06-14 — End: 2020-06-17
  Administered 2020-06-14 – 2020-06-16 (×5): 25 mg via INTRAVENOUS
  Filled 2020-06-14 (×5): qty 1

## 2020-06-14 MED ORDER — SODIUM CHLORIDE 0.9 % (FLUSH) INJECTION SYRINGE
10.0000 mL | INJECTION | INTRAMUSCULAR | Status: DC | PRN
Start: 2020-06-14 — End: 2020-06-14

## 2020-06-14 MED ORDER — SODIUM CHLORIDE 0.9 % (FLUSH) INJECTION SYRINGE
10.0000 mL | INJECTION | Freq: Three times a day (TID) | INTRAMUSCULAR | Status: DC
Start: 2020-06-14 — End: 2020-06-17
  Administered 2020-06-14 – 2020-06-15 (×2): 0 mL via INTRAVENOUS
  Administered 2020-06-15 – 2020-06-16 (×4): 10 mL via INTRAVENOUS
  Administered 2020-06-16: 0 mL via INTRAVENOUS
  Administered 2020-06-17: 10 mL via INTRAVENOUS

## 2020-06-14 MED ORDER — ESCITALOPRAM 10 MG TABLET
20.0000 mg | ORAL_TABLET | Freq: Every day | ORAL | Status: DC
Start: 2020-06-14 — End: 2020-06-17
  Administered 2020-06-14 – 2020-06-17 (×4): 20 mg via ORAL
  Filled 2020-06-14 (×6): qty 2

## 2020-06-14 MED ORDER — NICOTINE 21 MG/24 HR DAILY TRANSDERMAL PATCH
21.0000 mg | MEDICATED_PATCH | Freq: Every day | TRANSDERMAL | Status: DC
Start: 2020-06-14 — End: 2020-06-17
  Administered 2020-06-14 – 2020-06-17 (×4): 21 mg via TRANSDERMAL
  Filled 2020-06-14 (×4): qty 1

## 2020-06-14 MED ORDER — LACTATED RINGERS INTRAVENOUS SOLUTION
INTRAVENOUS | Status: DC
Start: 2020-06-14 — End: 2020-06-14

## 2020-06-14 MED ORDER — ALPRAZOLAM 1 MG TABLET
2.0000 mg | ORAL_TABLET | Freq: Three times a day (TID) | ORAL | Status: DC | PRN
Start: 2020-06-14 — End: 2020-06-15
  Administered 2020-06-14 – 2020-06-15 (×3): 2 mg via ORAL
  Filled 2020-06-14 (×3): qty 2

## 2020-06-14 MED ORDER — ONDANSETRON HCL (PF) 4 MG/2 ML INJECTION SOLUTION
4.0000 mg | Freq: Three times a day (TID) | INTRAMUSCULAR | Status: DC | PRN
Start: 2020-06-14 — End: 2020-06-17
  Administered 2020-06-15 – 2020-06-17 (×2): 4 mg via INTRAVENOUS
  Filled 2020-06-14 (×2): qty 2

## 2020-06-14 MED ORDER — KETOROLAC 15 MG/ML INJECTION SOLUTION
15.0000 mg | Freq: Four times a day (QID) | INTRAMUSCULAR | Status: DC | PRN
Start: 2020-06-14 — End: 2020-06-14

## 2020-06-14 MED ORDER — NORTRIPTYLINE 25 MG CAPSULE
25.0000 mg | ORAL_CAPSULE | Freq: Every evening | ORAL | Status: DC
Start: 2020-06-14 — End: 2020-06-17
  Administered 2020-06-14 – 2020-06-16 (×3): 25 mg via ORAL
  Filled 2020-06-14 (×5): qty 1

## 2020-06-14 MED ORDER — LEVOTHYROXINE 100 MCG TABLET
300.0000 ug | ORAL_TABLET | Freq: Every morning | ORAL | Status: DC
Start: 2020-06-14 — End: 2020-06-16
  Administered 2020-06-14 – 2020-06-16 (×4): 300 ug via ORAL
  Filled 2020-06-14 (×5): qty 3

## 2020-06-14 MED ORDER — ENOXAPARIN 40 MG/0.4 ML SUB-Q SYRINGE - EAST
40.0000 mg | INJECTION | Freq: Every day | SUBCUTANEOUS | Status: DC
Start: 2020-06-14 — End: 2020-06-17
  Administered 2020-06-14 – 2020-06-15 (×2): 0 mg via SUBCUTANEOUS

## 2020-06-14 MED ORDER — ACETAMINOPHEN 500 MG TABLET
500.0000 mg | ORAL_TABLET | ORAL | Status: DC | PRN
Start: 2020-06-14 — End: 2020-06-14

## 2020-06-14 MED ORDER — OXYCODONE-ACETAMINOPHEN 5 MG-325 MG TABLET
1.0000 | ORAL_TABLET | ORAL | Status: DC | PRN
Start: 2020-06-14 — End: 2020-06-16
  Administered 2020-06-14 – 2020-06-16 (×8): 1 via ORAL
  Filled 2020-06-14 (×8): qty 1

## 2020-06-14 NOTE — Care Plan (Signed)
Problem: Adult Inpatient Plan of Care  Goal: Plan of Care Review  Outcome: Ongoing (see interventions/notes)  Goal: Patient-Specific Goal (Individualized)  Outcome: Ongoing (see interventions/notes)  Goal: Absence of Hospital-Acquired Illness or Injury  Outcome: Ongoing (see interventions/notes)  Goal: Optimal Comfort and Wellbeing  Outcome: Ongoing (see interventions/notes)  Goal: Rounds/Family Conference  Outcome: Ongoing (see interventions/notes)     Problem: Pain Acute  Goal: Acceptable Pain Control and Functional Ability  Outcome: Ongoing (see interventions/notes)

## 2020-06-14 NOTE — ED Attending Note (Shared)
Patient care turned over to me at shift change, pending CT scan.  CT scan shows bilateral large adnexal masses, patient continues to report pain, continues to report nausea despite multiple doses of antiemetics, [place] and does not have a PCP or gynecologist in the area, I discussed this case with the resident on-call, will hospitalize at this time for further evaluation.

## 2020-06-14 NOTE — Nurses Notes (Signed)
Nursing Handoff Report    Reason for Handoff - Patient transferred from ED to inpatient unit    The following was communicated orally to Vinnie Level, RN for coordination of continuing care:    Margaret Pyle, 49 y.o. female    Attending Provider: Wandra Feinstein, MD    Allergies   Allergen Reactions   . Penicillins Rash   . Ultram [Tramadol] Rash   . Flagyl [Metronidazole]  Other Adverse Reaction (Add comment)     Migraine      . Flexeril [Cyclobenzaprine]  Other Adverse Reaction (Add comment)     Migraine     . Toradol [Ketorolac]  Other Adverse Reaction (Add comment)     Migraine         Code Status Information     Code Status Advance Care Planning    Not on file Jump to the Activity          Active Hospital Problems    Diagnosis   . Primary Problem: Vomiting and diarrhea   . Ovarian cyst, bilateral       Most recent vital signs - Temperature: 36.8 C (98.3 F)  Heart Rate: (!) 102  BP (Non-Invasive): (!) 163/82  Respiratory Rate: 14  SpO2: 92 %    Pt is not a high fall risk patient.    Medications   NS flush syringe (has no administration in time range)   NS flush syringe (has no administration in time range)   acetaminophen (TYLENOL) tablet (0 mg Oral Not Given 06/13/20 2226)   ibuprofen (MOTRIN) tablet (0 mg Oral Not Given 06/13/20 2226)   ondansetron (ZOFRAN) 2 mg/mL injection (4 mg Intravenous Given 06/13/20 2222)   NS bolus infusion 1,000 mL (1,000 mL Intravenous New Bag/New Syringe 06/13/20 2223)   iopamidol (ISOVUE-300) 61% infusion (80 mL Intravenous Given 06/13/20 2326)   metoclopramide (REGLAN) 5 mg/mL injection (10 mg Intravenous Given 06/13/20 2240)   diphenhydrAMINE (BENADRYL) 50 mg/mL injection (50 mg Intravenous Given 06/13/20 2240)   morphine 4 mg/mL injection (has no administration in time range)       No current outpatient medications on file.       Peripheral IV Left Median Cubital  (antecubital fossa) (Active)       Abnormal assessments communicated to receiving nurse - Yes     Abnormal labs communicated to  receiving nurse - Yes    Abnormal diagnostic exams communicated to receiving nurse - Yes     Home meds with patient (if yes, list specific meds) - No    Check all pump infusion rates against doctor's order and verify all weight based medication infusing in kilograms communicated to receiving nurse  - N/A     Laurina Fischl Larina Earthly, RN

## 2020-06-14 NOTE — Care Plan (Signed)
Problem: Pain Acute  Goal: Acceptable Pain Control and Functional Ability  Outcome: Ongoing (see interventions/notes)  Intervention: Develop Pain Management Plan  Note: PRN medication provided  Intervention: Prevent or Manage Pain  Flowsheets (Taken 06/14/2020 0400 by Karena Addison, RN)  Medication Review/Management: medications reviewed

## 2020-06-14 NOTE — Nurses Notes (Signed)
Report received from [facility], pt is resting in bed, there are no issues or complaints at his time, updated the pt on the plan of care throughout the night.

## 2020-06-14 NOTE — Nurses Notes (Signed)
Received the patient from ED via wheelchair, C/O back pain , alert and oriented X3 , requested food, DR. Elder at the bedside , evaluation done.

## 2020-06-14 NOTE — H&P (Signed)
FAMILY MEDICINE  History & Physical       Name: Kara, Barnes, 49 y.o. female Date of Service:  06/14/2020   Date of Birth:  [birth year]  MRN: [id number] Date of Admission:  06/13/2020   PCP: Pcp Not In System LOS: 0    Room: 250/A   Attending: Wandra Feinstein, MD   Information Obtained from: Patient  Code Status: No Order Chief Complaint: Vomiting and Diarrhea  Admitted for:      HISTORY of PRESENTING ILLNESS:   Kara Barnes is a 49 y.o. female who presented to the emergency department with nausea, vomiting (yellow non-bloody)  and diarrhea (watery, bright green)  that started two weeks ago and has progressively gotten worse since onset. Associated symptoms include generalized abdominal pain and low back pain which she ranks an 8/10. There has been no relief with OTC tylenol and ibuprofen.        MEDICAL HISTORY:    PAST MEDICAL & SURGICAL HISTORIES:   Past Medical History:   Diagnosis Date    Anxiety     Aortic valve disorder     Arthropathy     Cervical dysphagia 1995    COPD (chronic obstructive pulmonary disease) (CMS HCC)     Coronary artery disease     CVA (cerebrovascular accident) (CMS [place])     Depression     HTN (hypertension)     Migraine     Syncopal episodes     Thyroid disease     Past Surgical History:   Procedure Laterality Date    DENTAL SURGERY      HX CESAREAN SECTION      HX CHOLECYSTECTOMY      SINUS SURGERY          HOME MEDICATIONS:  Outpatient Medications Marked as Taking for the 06/13/20 encounter [facility] Encounter)   Medication Sig    acetaminophen (TYLENOL) 500 mg Oral Tablet Take 500 mg by mouth Every 4 hours as needed for Pain    ALPRAZolam (XANAX) 2 mg Oral Tablet Take 2 mg by mouth    escitalopram oxalate (LEXAPRO) 20 mg Oral Tablet Take 20 mg by mouth Once a day    levothyroxine (SYNTHROID) 100 mcg Oral Tablet Take 300 mcg by mouth Every morning    nortriptyline (PAMELOR) 10 mg Oral Capsule Take 10 mg by mouth Every night        ALLERGIES:  She is allergic to penicillins, ultram  [tramadol], flagyl [metronidazole], flexeril [cyclobenzaprine], and toradol [ketorolac].     FAMILY HISTORY:  Her family history is not on file.    SOCIAL HISTORY:  She  reports previous alcohol use. She  reports that she has been smoking. She has been smoking about 1.00 pack per day. She has never used smokeless tobacco. She  reports current drug use. Drug: Marijuana.  Social History     Social History Narrative    Not on file       REVIEW OF SYSTEMS:   Constitutional: Negative for fevers  Eyes: Negative for visual disturbance  Ears, nose, mouth, throat, and face: Negative for congestion, sore throat.  Respiratory: Negative for cough, sputum, hemoptysis, dyspnea on exertion.  Cardiovascular: Negative for chest pain, palpitations, lower extremity edema.  Gastrointestinal: Positive for nausea, vomiting, diarrhea and abdominal pain   Genitourinary: Negative for dysuria, hematuria.  Lymphatic: Negative for lymphadenopathy.  Neurological:Positive for headaches    Behavioral/Psych: Positive for anxiety       EXAMINATION:  Temperature: 36.8 C (98.3 F)  Heart Rate: (!) 102 BP (Non-Invasive): (!) 163/82   Respiratory Rate: 14 SpO2: 92 %       General: Mild distress at bedside, Well-nourished  Head: Normocephalic, atraumatic  Eyes: Pupils equal and round, conjunctivae clear.  Throat:  mucous membranes moist, pt. Wears dentures for bottom row of teeth   Neck: Trachea midline, no thyromegaly or lymphadenopathy.   Cardiovascular: RRR, no murmurs, rubs, gallops.  Respiratory: CTABL, no wheezes, rhonchi, crackles.  Abdominal: Bowel sounds hyperactive; abdomen soft, tender to palpation in all quadrants, pain out of porportion to exam,   Extremities: No edema. Radial pulses 2+ bilaterally.   Skin: Warm and dry.  Neurological: Awake, A&O x 3.   Psychiatric: Anxious. Speech content normal    LABS:    CBC Differential   Recent Labs     06/13/20  2221   WBC 14.2*   HGB 15.2   HCT 45.2   PLTCNT 292    Recent Labs     06/13/20  2221    PMNS 65   MONOCYTES 5   BASOPHILS 0  <0.10   PMNABS 9.26*   LYMPHSABS 3.97   MONOSABS 0.76   EOSABS <0.10      BMP LFTs   Recent Labs     06/13/20  2221   SODIUM 140   POTASSIUM 3.8   CHLORIDE 106   CO2 23   BUN 8   CREATININE 0.59*   GLUCOSENF 102   ANIONGAP 11   BUNCRRATIO 14   GFR >90   CALCIUM 10.1*   ALBUMIN 4.4    Recent Labs     06/13/20  2221   TOTALPROTEIN 7.8   ALBUMIN 4.4   TOTBILIRUBIN 0.4   UROBILINOGEN < 1   AST 14   ALT 13   ALKPHOS 187*   LIPASE 24      CoAgs Blood Gas:   No results found for this encounter No results found for this encounter    Cardiac Markers Lipid Panel   No results for input(s): TROPONINI, CKMB, MBINDEX, BNP in the last 72 hours. No results found for this encounter   Urine Analysis Other Labs   Recent Labs     06/13/20  2221   APPEARANCE Clear   COLOR Yellow   SPECGRAVUR 1.015   GLUCOSE Normal   BILIRUBIN Negative   KETONES Negative   PHURINE 6.0   PROTEIN Negative   UROBILINOGEN < 1   NITRITE Negative   LEUKOCYTES Negative   RBCS 0-2   WBCS None   BACTERIA None    No results found for this encounter    Invalid input(s): PRL     IMAGING STUDIES:    Results for orders placed or performed during the hospital encounter of 06/13/20 (from the past 24 hour(s))   CT ABDOMEN PELVIS W IV CONTRAST     Status: None    Narrative    Kara Barnes  Female, 49 years old.    CT ABDOMEN PELVIS W IV CONTRAST performed on 06/13/2020 11:36 PM.    REASON FOR EXAM:  LLQ AP    RADIATION DOSE: 1154.20 mGycm    CONTRAST: 80 ml's of Isovue 300.    TECHNIQUE: Contrast enhanced CT abdomen and pelvis was performed.  Multiplanar reformats provided.    COMPARISON: None.    FINDINGS: The lung bases demonstrate no discrete opacification. The cardiac  base is not well assessed.    There is hepatomegaly noted. The gallbladder is surgically absent. The  spleen is overall within normal limits for size. The adrenal glands and  pancreas are overall within normal limits for size and appearance on venous  phase  imaging.    There is no hydronephrosis. No radiopaque renal calculus is identified. Too  small to characterize low attenuating left renal mass probably represents a  cyst. The proximal ureters are not dilated. The bladder is moderately  distended and unopacified, limiting assessment for wall thickening.    However, there are bilateral low-density adnexal masses demonstrating  internal complexity and probable daughter cyst formation. Right adnexal  mass measures 10 x 6.2 cm. Abutting but left adnexal mass measures  approximately 5.6 x 4.2 cm. There are some thin but enhancing septations  present. The uterus is not well assessed by CT imaging.    Evaluation of the bowel is suboptimal. The stomach is mildly distended.  There is no evidence cyst has bowel obstruction at this time. There is a  moderate volume retained stool throughout the colon. The appendix is within  normal limits within the right lower quadrant of the abdomen.    Intrapelvic aorta and branch vessel calcifications are more pronounced than  expected for patient's age. There is some narrowing of the celiac origin.  Possible fibrofatty plaque or subocclusive thrombus involving the SMA  origin.    Overall spinal alignment is maintained without substantial degenerative  change. Small fat-containing umbilical hernia is evident.      Impression    1. Bilateral complex adnexal/possibly ovarian origin masses with internal  multicystic change and enhancing thin septations. Follow-up nonemergent  pelvic ultrasound for further characterization is recommended. Due to size,  these may also require contrast-enhanced MRI of the pelvis.  2. More than expected intra-abdominal aorta and branch vessel  atherosclerotic disease for patient's age. There is narrowing involving the  celiac origin. Probable fibrofatty plaque or less likely subocclusive  thrombus involving the SMA origin.  3. No evidence of urinary or bowel obstruction.  4. Mild hepatomegaly. Borderline fatty  liver.          Radiologist location ID: [id number]         EKG:        ASSESSMENT:  Kara Barnes is a 49 y.o. female with PMH of CAD, Anxiety/Depression, Migraine, COPD, HTN and thyroid disease who presents to the emergency department with nausea, vomiting and diarrhea for two weeks.      PLAN:  Present on Admission:   Vomiting and diarrhea  -Pt. Refused Toradol IV, ordered ibuprofen 800mg  for pain   - pain out of proportion to exam could consider acute mesenteric ischemia (CT abd, atherosclerotic disease in branched vessels of aorta)   - Pt. Received Zofran in ED, continue PRN for nausea     Bilateral Complex ovarian cysts   - Pelvic Ultrasound ordered for today    Chronic Medical Conditions Managed During This Admission -    - Anxiety/Depression   - Hold Xanax and start ativan, continue Lexapro    -Possible withdrawal symptoms from pt. Not taking Lexapro for the past two days      Thyroid Disease   -Continue synthroid 153mcg       DVT/PE Prophylaxis - Enoxaparin  Diet - Regular  Activity - Up Ad-Lib  Disposition Planning - TBD; Likely Home discharge     Patient seen and discussed with Dr. Rise Patience and attending physician, Dr. Marcelline Mates.     Kathaleen Bury, MD - 06/14/2020  PGY-1, Family Medicine  I saw and examined the patient with resident team on 06/14/20.  I reviewed the resident's note.  I agree with the findings and plan of care as documented in the resident's note.  Any exceptions/additions are edited/noted.    Wandra Feinstein, MD

## 2020-06-14 NOTE — Nurses Notes (Signed)
Report was given by RN Amy from ED,
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
## 2020-06-15 ENCOUNTER — Observation Stay (HOSPITAL_COMMUNITY): Payer: Medicaid - Out of State | Admitting: Anesthesiology

## 2020-06-15 ENCOUNTER — Encounter (HOSPITAL_COMMUNITY): Payer: Self-pay | Admitting: Family Medicine

## 2020-06-15 ENCOUNTER — Observation Stay (HOSPITAL_COMMUNITY): Payer: Medicaid - Out of State | Admitting: Radiology

## 2020-06-15 ENCOUNTER — Observation Stay (HOSPITAL_COMMUNITY): Payer: Medicaid - Out of State

## 2020-06-15 ENCOUNTER — Encounter (HOSPITAL_COMMUNITY)
Admission: EM | Disposition: A | Discharge status: Other Acute Inpatient Hospital | Payer: Self-pay | Source: Home / Self Care | Attending: Emergency Medicine

## 2020-06-15 DIAGNOSIS — N83202 Unspecified ovarian cyst, left side: Secondary | ICD-10-CM

## 2020-06-15 DIAGNOSIS — N83201 Unspecified ovarian cyst, right side: Secondary | ICD-10-CM

## 2020-06-15 DIAGNOSIS — N838 Other noninflammatory disorders of ovary, fallopian tube and broad ligament: Secondary | ICD-10-CM

## 2020-06-15 DIAGNOSIS — D270 Benign neoplasm of right ovary: Secondary | ICD-10-CM

## 2020-06-15 DIAGNOSIS — F329 Major depressive disorder, single episode, unspecified: Secondary | ICD-10-CM

## 2020-06-15 DIAGNOSIS — F17210 Nicotine dependence, cigarettes, uncomplicated: Secondary | ICD-10-CM

## 2020-06-15 DIAGNOSIS — Z9889 Other specified postprocedural states: Secondary | ICD-10-CM | POA: Diagnosis not present

## 2020-06-15 DIAGNOSIS — D271 Benign neoplasm of left ovary: Secondary | ICD-10-CM

## 2020-06-15 DIAGNOSIS — M545 Low back pain: Secondary | ICD-10-CM

## 2020-06-15 DIAGNOSIS — R102 Pelvic and perineal pain: Secondary | ICD-10-CM

## 2020-06-15 DIAGNOSIS — E039 Hypothyroidism, unspecified: Secondary | ICD-10-CM

## 2020-06-15 LAB — COMPREHENSIVE METABOLIC PANEL, NON-FASTING
ALBUMIN: 3.6 g/dL (ref 3.5–5.0)
ALKALINE PHOSPHATASE: 190 U/L — ABNORMAL HIGH (ref 40–110)
ALT (SGPT): 88 U/L — ABNORMAL HIGH (ref 8–22)
ANION GAP: 9 mmol/L (ref 4–13)
AST (SGOT): 123 U/L — ABNORMAL HIGH (ref 8–45)
BILIRUBIN TOTAL: 0.7 mg/dL (ref 0.3–1.3)
BUN/CREA RATIO: 16 (ref 6–22)
BUN: 9 mg/dL (ref 8–25)
CALCIUM: 9.7 mg/dL (ref 8.5–10.0)
CHLORIDE: 108 mmol/L (ref 96–111)
CO2 TOTAL: 25 mmol/L (ref 22–30)
CREATININE: 0.55 mg/dL — ABNORMAL LOW (ref 0.60–1.05)
ESTIMATED GFR: >90 mL/min/BSA (ref >=60)
GLUCOSE: 105 mg/dL (ref 65–125)
POTASSIUM: 3.4 mmol/L — ABNORMAL LOW (ref 3.5–5.1)
PROTEIN TOTAL: 6.5 g/dL (ref 6.4–8.3)
SODIUM: 142 mmol/L (ref 136–145)

## 2020-06-15 LAB — TYPE AND SCREEN
ABO/RH(D): O POS
ANTIBODY SCREEN: NEGATIVE

## 2020-06-15 LAB — ABO & RH: ABO/RH(D): O POS

## 2020-06-15 LAB — HIV1/HIV2 SCREEN, COMBINED ANTIGEN AND ANTIBODY: HIV SCREEN, COMBINED ANTIGEN & ANTIBODY: NEGATIVE

## 2020-06-15 LAB — HEPATITIS C ANTIBODY SCREEN WITH REFLEX TO HCV PCR: HCV ANTIBODY QUALITATIVE: UNDETERMINED — AB

## 2020-06-15 SURGERY — LAPAROSCOPY DIAGNOSTIC
Anesthesia: General | Site: Abdomen | Laterality: Bilateral | Wound class: Clean Contaminated Wounds-The respiratory, GI, Genital, or urinary

## 2020-06-15 MED ORDER — ONDANSETRON HCL (PF) 4 MG/2 ML INJECTION SOLUTION
4.0000 mg | Freq: Once | INTRAMUSCULAR | Status: DC | PRN
Start: 2020-06-15 — End: 2020-06-15

## 2020-06-15 MED ORDER — LIDOCAINE (PF) 20 MG/ML (2 %) INJECTION SOLUTION
Freq: Once | INTRAMUSCULAR | Status: DC | PRN
Start: 2020-06-15 — End: 2020-06-15
  Administered 2020-06-15: 5 mL via INTRAVENOUS

## 2020-06-15 MED ORDER — PROPOFOL 10 MG/ML IV BOLUS
INJECTION | Freq: Once | INTRAVENOUS | Status: DC | PRN
Start: 2020-06-15 — End: 2020-06-15
  Administered 2020-06-15: 200 mg via INTRAVENOUS

## 2020-06-15 MED ORDER — SUCCINYLCHOLINE(PF)200 MG/10 ML(20 MG/ML)-NACL,ISO INTRAVENOUS SYRINGE
INJECTION | Freq: Once | INTRAVENOUS | Status: DC | PRN
Start: 2020-06-15 — End: 2020-06-15
  Administered 2020-06-15: 100 mg via INTRAVENOUS

## 2020-06-15 MED ORDER — ONDANSETRON HCL (PF) 4 MG/2 ML INJECTION SOLUTION
Freq: Once | INTRAMUSCULAR | Status: DC | PRN
Start: 2020-06-15 — End: 2020-06-15
  Administered 2020-06-15: 4 mg via INTRAVENOUS

## 2020-06-15 MED ORDER — FENTANYL (PF) 50 MCG/ML INJECTION SOLUTION
INTRAMUSCULAR | Status: AC
Start: 2020-06-15 — End: 2020-06-15
  Filled 2020-06-15: qty 2

## 2020-06-15 MED ORDER — FENTANYL (PF) 50 MCG/ML INJECTION SOLUTION
Freq: Once | INTRAMUSCULAR | Status: DC | PRN
Start: 2020-06-15 — End: 2020-06-15
  Administered 2020-06-15: 50 ug via INTRAVENOUS
  Administered 2020-06-15: 100 ug via INTRAVENOUS
  Administered 2020-06-15: 50 ug via INTRAVENOUS

## 2020-06-15 MED ORDER — FENTANYL (PF) 50 MCG/ML INJECTION SOLUTION
25.0000 ug | INTRAMUSCULAR | Status: DC | PRN
Start: 2020-06-15 — End: 2020-06-15

## 2020-06-15 MED ORDER — ROCURONIUM 10 MG/ML INTRAVENOUS SOLUTION
Freq: Once | INTRAVENOUS | Status: DC | PRN
Start: 2020-06-15 — End: 2020-06-15
  Administered 2020-06-15: 30 mg via INTRAVENOUS

## 2020-06-15 MED ORDER — DEXAMETHASONE SODIUM PHOSPHATE 4 MG/ML INJECTION SOLUTION
Freq: Once | INTRAMUSCULAR | Status: DC | PRN
Start: 2020-06-15 — End: 2020-06-15
  Administered 2020-06-15: 8 mg via INTRAVENOUS

## 2020-06-15 MED ORDER — MORPHINE 4 MG/ML INTRAVENOUS SYRINGE
5.0000 mg | INJECTION | INTRAVENOUS | Status: DC | PRN
Start: 2020-06-15 — End: 2020-06-15
  Administered 2020-06-15: 5 mg via INTRAVENOUS
  Filled 2020-06-15 (×2): qty 1

## 2020-06-15 MED ORDER — MIDAZOLAM 5 MG/ML INJECTION SOLUTION
Freq: Once | INTRAMUSCULAR | Status: DC | PRN
Start: 2020-06-15 — End: 2020-06-15
  Administered 2020-06-15: 5 mg via INTRAVENOUS

## 2020-06-15 MED ORDER — LACTATED RINGERS INTRAVENOUS SOLUTION
INTRAVENOUS | Status: DC | PRN
Start: 2020-06-15 — End: 2020-06-15

## 2020-06-15 MED ORDER — BUPIVACAINE-EPINEPHRINE (PF) 0.25 %-1:200,000 INJECTION SOLUTION
Freq: Once | INTRAMUSCULAR | Status: DC | PRN
Start: 2020-06-15 — End: 2020-06-15
  Administered 2020-06-15: 7 mL via INTRAMUSCULAR

## 2020-06-15 MED ORDER — ALPRAZOLAM 1 MG TABLET
2.0000 mg | ORAL_TABLET | Freq: Three times a day (TID) | ORAL | Status: DC | PRN
Start: 2020-06-15 — End: 2020-06-17
  Administered 2020-06-15 – 2020-06-17 (×5): 2 mg via ORAL
  Filled 2020-06-15 (×5): qty 2

## 2020-06-15 MED ORDER — LACTATED RINGERS INTRAVENOUS SOLUTION
INTRAVENOUS | Status: DC
Start: 2020-06-15 — End: 2020-06-16

## 2020-06-15 SURGICAL SUPPLY — 49 items
ADHESIVE TISSUE EXOFIN 1.0ML PREMIERPRO EXOFIN (SEALANTS) ×1
ADHESIVE TISSUE EXOFIN 1.0ML_PREMIERPRO EXOFIN (SEALANTS) ×1
APPL 70% ISPRP 2% CHG 26ML 13. 2X13.2IN CHLRPRP PREP DEHP-FR (WOUND CARE/ENTEROSTOMAL SUPPLY) ×1
APPL 70% ISPRP 2% CHG 26ML CHLRPRP HI-LT ORNG PREP STRL LF  DISP CLR (WOUND CARE SUPPLY) ×1 IMPLANT
BLADE 11 BD SAF PROTECT LOCK R_TRCT SHLD SS SURG STRL LF (CUTTING ELEMENTS) ×1
BLADE 11 BD SHARP LOCK RETRACT SHIELD SS SURG STRL LF  DISP (SURGICAL CUTTING SUPPLIES) ×1 IMPLANT
CONV USE 338451 - KIT LRG SET UP CUSTOM (CUSTOM TRAYS & PACK) ×1 IMPLANT
CONV USE ITEM 156524 - ADHESIVE TISSUE EXOFIN 1.0ML_PREMIERPRO EXOFIN (SEALANTS) ×1 IMPLANT
DEVICE ENDOS ENDO PNUT 5MM 45C M SWAB COTTON DISP (INSTRUMENTS ENDOMECHANICAL) ×1
DEVICE ENDOS ENDO PNUT 5MM 45CM SWAB COTTON DISP (ENDOSCOPIC SUPPLIES) ×1
DEVICE SPEC RETR INZII 10MM GU IDE BEAD STD ENDOS 225ML LF (INSTRUMENTS ENDOMECHANICAL) ×2
DEVICE SPEC RETR INZII 10MM GUIDE BEAD STD ENDOS 225ML LF (ENDOSCOPIC SUPPLIES) ×2 IMPLANT
DISCONTINUED USE ITEM 309153 - TRAP SPECI ARGYLE 40CC GRAD SC_REW ON CAP REM MALE CONN (Cautery Accessories) ×1 IMPLANT
DRAPE LPCHL ABS REINF FENESTRA TE ADH TRGH 122X102X78IN LF (CUSTOM TRAYS & PACK) ×1
DRAPE LPCHL ABS REINF FENESTRATE ADH TRGH 122X102X78IN LF  STRL DISP SURG SMS 12X12IN (CUSTOM TRAYS & PACK) ×1 IMPLANT
DRAPE PCH DRAIN PORT 33.5X32X1 4IN UNDR BUTT PRXM LF STRL (CUSTOM TRAYS & PACK) ×1
DRAPE PCH DRAIN PORT 33.5X32X14IN UNDR BUTT PRXM LF  STRL DISP SURG SMS 46X33.5IN (CUSTOM TRAYS & PACK) ×1 IMPLANT
ELECTRODE PATIENT RTN 9FT VLAB C30- LB RM PHSV ACRL FOAM CORD NONIRRITATE NONSENSITIZE ADH STRP (CAUTERY SUPPLIES) ×1 IMPLANT
ELECTRODE PATIENT RTN 9FT VLAB REM C30- LB PLHSV ACRL FOAM (CAUTERY SUPPLIES) ×1
EVACUATOR SMOKE LAP 8.0L/MIN SC082500 PINK 10EA/BX (INSTRUMENTS) ×1
FILTER SMOKEVAC PNK SEECLEAR 15 MMHG LAPSCP MLSTG SYS PSV DISP (SURGICAL INSTRUMENTS) ×1 IMPLANT
HANDPC ESURG 35CM 5MM THUNDERB EAT FRN GRIP (CUTTING ELEMENTS) ×1
HANDPC ESURG 35CM 5MM THUNDERBEAT FRN ACTUATE GRIP S STRL LF  DISP (SURGICAL CUTTING SUPPLIES) ×1 IMPLANT
KIT ANFG DXD FRED II SPONGE STRL DISP LAPSCP (OPHTHALMIC SUPPLIES (NOT LENS)) ×1 IMPLANT
KIT LRG SET UP CUSTOM (CUSTOM TRAYS & PACK) ×1
MANIPULATR SURG 4.5MM ZUMI-ZIN NATI UTER ERGONOMICAL CURVE (INSTRUMENTS) ×1
MANIPULATR SURG 4.5MM ZUMI-ZINNATI UTER ERGONOMICAL CURVE INJECTOR 12IN STRL LF  DISP (SURGICAL INSTRUMENTS) ×1 IMPLANT
MANIPULATR SURG 4.5MM ZUMI-ZIN_NATI UTER ERGONOMICAL CURVE (INSTRUMENTS) ×1
MBO USE ITEM 130230 - DEVICE ENDOS ENDO PNUT 5MM 45CM SWAB COTTON DISP (ENDOSCOPIC SUPPLIES) ×1 IMPLANT
NEEDLE INSFL 100MM 14GA STEP P NMPRTN TROCAR BLDLS RADIAL (INSTRUMENTS ENDOMECHANICAL) ×1
NEEDLE INSFL 100MM 14GA STEP PNMPRTN TROCAR BLDLS RADIAL EXPD SLEEVE BLUNT STY VRSTP + SS (ENDOSCOPIC SUPPLIES) ×1 IMPLANT
PACK SURG PERI GYN TBRN (CUSTOM TRAYS & PACK) ×1 IMPLANT
PACK SURG SIRUS LITH II UNDERB UTTOCK DRP REINF TBL CVR TAPE (DRAPE/PACKS/SHEETS/OR TOWEL) ×1
PACK SURG SIRUS LITH II UNDERBUTTOCK DRP REINF TBL CVR TAPE STRL 90X50IN 47X35IN LF (DRAPE/PACKS/SHEETS/OR TOWEL) ×1 IMPLANT
PACK SURG SIRUS LITH II UNDERB_UTTOCK DRP REINF TBL CVR TAPE (DRAPE/PACKS/SHEETS/OR TOWEL) ×1
PACK SURG STD LAPSCP P GYN STRL TBRN (CUSTOM TRAYS & PACK) ×1
PAD 29X20X1IN DERMAPROX POSITION THE PNK PAD 1 LFT SHEET STD TRND ARM PRTC BODY STRAP NONST DISP (SOFT) ×1 IMPLANT
SET TUBING HEAT ISO CONN DISP (Connecting Tubes/Misc) ×2 IMPLANT
SOLUTION ANTIFOG FRED W SPONGE_22050 20EA/BX (OPTHALMIC SUPPLIES (NOT LENS)) ×1
SYSTEM PIGAZZI POSITION W/VISC 40586 3EA/CS (SOFT) ×1
TOWEL 26X16IN COTTON BLU SAF D ISP SURG STRL LF (PROTECTIVE PRODUCTS/GARMENTS) ×1
TOWEL 26X16IN COTTON BLU SAFD_ISP SURG STRL LF (PROTECTIVE PRODUCTS/GARMENTS) ×1 IMPLANT
TRAP SPECI ARGYLE 40CC GRAD SC REW ON CAP REM MALE CONN (Cautery Accessories) ×1
TROCAR 5MM W/EXPAND SLEEVE DIL_VS101005 3EA/BX (INSTRUMENTS ENDOMECHANICAL) ×1
TROCAR BLADELESS 11MM STD NONB11STF VERSAPORT 6EA/BX (INSTRUMENTS ENDOMECHANICAL) ×2
TROCAR BLADELESS 5MM NONB5STF 6EA/BX DISP (INSTRUMENTS ENDOMECHANICAL) ×1
TROCAR LAPSCP STD 100MM 11MM VERSAONE FIX CANN BLDLS DLPHN NOSE TIP STRL LF  DISP (ENDOSCOPIC SUPPLIES) ×2 IMPLANT
TROCAR LAPSCP STD 100MM 5MM VERSAONE FIX CANN BLDLS DLPHN NOSE TIP STRL LF  DISP (ENDOSCOPIC SUPPLIES) ×1 IMPLANT
TROCAR LAPSCP STD 5MM VRSTP BLDLS RADIAL EXPD SLEEVE CANN DIL STRL LF  DISP (ENDOSCOPIC SUPPLIES) ×1 IMPLANT

## 2020-06-15 NOTE — Anesthesia Transfer of Care (Signed)
ANESTHESIA TRANSFER OF CARE   Kara Barnes is a 49 y.o. ,female, Weight: 59 kg (130 lb)   had Procedure(s):  LAPAROSCOPY DIAGNOSTIC  OOPHORECTOMY LAPAROSCOPIC  performed  06/15/20   Primary Service: Wandra Feinstein, MD    Past Medical History:   Diagnosis Date   . Anxiety    . Aortic valve disorder    . Arthropathy    . Cervical dysphagia 1995   . COPD (chronic obstructive pulmonary disease) (CMS [facility])    . Coronary artery disease    . CVA (cerebrovascular accident) (CMS [place])    . Depression    . HTN (hypertension)    . Migraine    . Syncopal episodes    . Thyroid disease       Allergy History as of 06/15/20     PENICILLINS       Noted Status Severity Type Reaction    06/13/20 1931 Francina Ames, RN 06/13/20 Active Medium  Rash          METRONIDAZOLE       Noted Status Severity Type Reaction    06/13/20 1932 Francina Ames, RN 06/13/20 Active    Other Adverse Reaction (Add comment)    Comments: Migraine              TRAMADOL       Noted Status Severity Type Reaction    06/13/20 1932 Francina Ames, RN 06/13/20 Active Medium  Rash          CYCLOBENZAPRINE       Noted Status Severity Type Reaction    06/13/20 1933 Francina Ames, RN 06/13/20 Active    Other Adverse Reaction (Add comment)    Comments: Migraine             KETOROLAC       Noted Status Severity Type Reaction    06/13/20 1933 Francina Ames, RN 06/13/20 Active    Other Adverse Reaction (Add comment)    Comments: Migraine                 I completed my transfer of care / handoff to the receiving personnel during which we discussed:  Access, Airway, All key/critical aspects of case discussed, Analgesia, Antibiotics, Expectation of post procedure, Fluids/Product, Gave opportunity for questions and acknowledgement of understanding, Labs and PMHx    Post Location: PACU                                                                  Last OR Temp: Temperature: 36.3 C (97.3 F)  ABG:  POTASSIUM   Date Value Ref Range Status   06/15/2020 3.4 (L) 3.5 -  5.1 mmol/L Final     KETONES   Date Value Ref Range Status   06/13/2020 Negative Negative mg/dL Final     CALCIUM   Date Value Ref Range Status   06/15/2020 9.7 8.5 - 10.0 mg/dL Final     Airway:* No LDAs found *  Blood pressure 138/68, pulse 96, temperature 36.3 C (97.3 F), resp. rate 17, height 1.499 m (4\' 11" ), weight 59 kg (130 lb), SpO2 90 %.

## 2020-06-15 NOTE — OR Surgeon (Signed)
[facility]                                              GYN OPERATIVE NOTE    Patient Name: Kara Barnes, Traub Number: [id number]  Date of Service: 06/15/2020   Date of Birth: 08-03-1971    Procedure(s)/Description:  Procedure(s):  LAPAROSCOPY DIAGNOSTIC - Wound Class: Clean Contaminated Wounds-The respiratory, GI, Gential, or urinary  Bilateral - OOPHORECTOMY LAPAROSCOPIC - Wound Class: Clean Contaminated Wounds-The respiratory, GI, Gential, or urinary  Salpingectomy bilateral  Peritoneal washing and cervical papsmear    Pre-Operative Diagnosis: bilateral ovarian cysts    Post-Operative Diagnosis: bilateral ovarian cysts    Findings: Large bilateral ovarian cyst R > L in menopausal female. Surgically absent portion of fallopian tubes bilaterally, normal uterus     Attending Surgeon: Theadore Nan MD    Anesthesia Type: General endotracheal anesthesia    Estimated Blood Loss:  Minimal     Blood Given: none          Fluids Given: 614 cc    Complications:  none    Drains: None           Specimens/ Cultures: Cervical cytology, peritoneal fluid, right ovary with cyst and cyst fluid and portion of right fallopian tube, left ovary and cyst and cyst fluid and portion of left fallopian tubes.            Disposition: PACU - hemodynamically stable.           Condition: stable        DESCRIPTION OF PROCEDURE:     After informed consent, the patient was taken to the operating suite where general anesthesia was administered. She was placed in dorsal lithotomy position, in [place] and prepped and draped in the usual fashion.  Patient voided prior to OR.  A bivalved speculum was placed into the vagina and the cervical pap was obtained prior to the prep. The patients was prepped in routine fashion and speculum was reinserted and the anterior lip of the cervix was grasped with a single-toothed tenaculum.  The Zumi uterine manipulator was inserted through the cervix. The speculum was removed.   Attention was turned to the abdomen where a 0.25% Marcaine was injected in the umbilical region. An 11 blade scalpel was used to make a small incision. A Veress needle with a  VersaStep sheath was placed into the abdomen into the incision. Instillation and aspiration of clear fluid, the water drop test was performed and confirmed that the Veress needle was in the abdomen.  The abdomen was insufflated with CO2 gas with opening pressures of 2 mmHg.   The Veress needle was removed and the 5 mm bladeless VersaStep trocar was inserted into the abdomen, without difficulty.  The 30 degree, 5 mm laparoscope was inserted into the abdomen.  No trauma to underlying viscera was noted.  The upper abdomen was inspected with the above findings. The patient was placed in Trendelenburg. 0.25% Marcaine was injected in both lower quadrants lateral to the inferior epigastric artery.  A 10 mm incision was made with the 11 blade scalpel in each site. A 10 mm disposable trocar was inserted in each incision under direct visualization with the laparoscope. An atraumatic bowel grasper was placed into the abdomen to further help inspect the pelvis. Pelvic washings were obtained. The bowel  gasper was used to grasp the right ovary and ovarian cyst. The cyst on the right measured about  12 cm in the largest diameter with remnants of the fimbriated end of the fallopian tube.  The large cyst encompassed the majority of the ovary. The Thunderbeat was placed into the abdomen and used to cauterize and then dissect the ovary/cyst/fallopian tube complex free from the mesosalpinx and cornu.   The adnexa was placed in a endocatch and the cyst was aspirated to allow deflation and removal of the adnex from the pelvis through the lateral port within the bag. The instruments were switched to opposite ports and the same process was performed on the left adnexa. This cyst was equally enlarged and large enough to not being able to spare ovarian tissue. The  specimens were removed from the pelvis through the 10 mm port sites.  The pelvis and abdomen were inspected once more.  No bleeding or trauma was noted from any area.   The lower quadrant ports were removed from the abdomen and no bleeding was noted from either port site under low pressure. The remainder of the CO2 gas was allowed to evacuate the abdomen.  The laparoscope and umbilical trocar were removed.   4-0 undyed Vicryl was used to reapproximate the incisions in subcuticular fashion.  Dermabond was applied to all incisions.  Attention was turned to the vagina.   The uterine manipulator and the foley were removed.  The vagina was wiped free of fluid and debris with a sponge-stick.  The speculum was removed. All counts were correct at the end of the procedure.    DISPOSITION: The patient was awakened and taken to recovery in stable condition, having tolerated the procedure well.

## 2020-06-15 NOTE — Nurses Notes (Signed)
Nursing Handoff Report    Reason for Handoff - Patient transferred from OR to inpatient unit     The following was communicated orally to [facility] RN  for coordination of continuing care:    Kara Barnes, 49 y.o. female    Attending Provider: Wandra Feinstein, MD    Allergies   Allergen Reactions   . Penicillins Rash   . Ultram [Tramadol] Rash   . Flagyl [Metronidazole]  Other Adverse Reaction (Add comment)     Migraine      . Flexeril [Cyclobenzaprine]  Other Adverse Reaction (Add comment)     Migraine     . Toradol [Ketorolac]  Other Adverse Reaction (Add comment)     Migraine         Code Status Information     Code Status Advance Care Planning    Full Code Jump to the Activity          Active Hospital Problems    Diagnosis   . Primary Problem: Ovarian cyst, bilateral   . Post-operative state   . Migraine   . Thyroid disease   . Depression   . Anxiety   . COPD (chronic obstructive pulmonary disease) (CMS HCC)   . Syncopal episodes   . Coronary artery disease   . HTN (hypertension)   . Aortic valve disorder   . Difficulty urinating   . Cervical dysphagia       Most recent vital signs - Temperature: 36.8 C (98.3 F)  Heart Rate: 84  BP (Non-Invasive): 138/85  Respiratory Rate: 20  SpO2: 94 %    Pt is a high fall risk patient.    Medications   acetaminophen (TYLENOL) tablet (0 mg Oral Not Given 06/13/20 2226)   ondansetron (ZOFRAN) 2 mg/mL injection (4 mg Intravenous Given 06/13/20 2222)   NS bolus infusion 1,000 mL (1,000 mL Intravenous New Bag/New Syringe 06/13/20 2223)   iopamidol (ISOVUE-300) 61% infusion (80 mL Intravenous Given 06/13/20 2326)   metoclopramide (REGLAN) 5 mg/mL injection (10 mg Intravenous Given 06/13/20 2240)   diphenhydrAMINE (BENADRYL) 50 mg/mL injection (50 mg Intravenous Given 06/13/20 2240)   morphine 4 mg/mL injection (has no administration in time range)       No current outpatient medications on file.       Peripheral IV Left Median Cubital  (antecubital fossa) (Active)   Line Status flushed without  difficulty;infusing 06/15/20 1649   Type of Fluids Infusing LR 06/15/20 1649   SITE ASSESSMENT WDL 06/15/20 1649   Phlebitis Scale 0 06/15/20 1649   Infiltration Scale 0 06/15/20 1649   DRESSING TYPE (LINES) Adhesive Bandage 06/15/20 1649   IV SECUREMENT N/A 06/15/20 1649   DRESSING STATUS Clean, Dry and Intact 06/15/20 1649   DRESSING DRAINAGE None 06/15/20 1649   Daily Site Maintenance & Management Met 06/15/20 1330       Abnormal assessments communicated to receiving nurse - Yes     Abnormal labs communicated to receiving nurse - N/A    Abnormal diagnostic exams communicated to receiving nurse - N/A     Home meds with patient (if yes, list specific meds) - N/A    Check all pump infusion rates against doctor's order and verify all weight based medication infusing in kilograms communicated to receiving nurse  - N/A     Malen Gauze, RN

## 2020-06-15 NOTE — Care Management Notes (Signed)
Non-Physician Consult Orders  (From admission, onward)  Ordered   Start   06/15/20 1306  IP CONSULT TO CARE MANAGEMENT ([facility])  ONE TIME     Complete     Process Instructions: If requesting assistance with completion of Advance Directives, please provide patient with the packet.   References:ON CALL ([facility])   Provider: (Not yet assigned)   Question: Indication For Consult: Answer: HOUSING ISSUES            Consult received- SW noted patient to be in OR. Will f/u with resource list upon return.

## 2020-06-15 NOTE — Anesthesia Preprocedure Evaluation (Addendum)
ANESTHESIA PRE-OP EVALUATION  Planned Procedure: LAPAROSCOPY DIAGNOSTIC (Bilateral Abdomen)  OOPHORECTOMY LAPAROSCOPIC (Bilateral Abdomen)  Review of Systems         patient summary reviewed  nursing notes reviewed        Pulmonary   COPD, moderate, current smoker and Smoked in last 24 hours,   Cardiovascular    Hypertension and Denies Chest Pain with exertion ,No peripheral edema,  Exercise Tolerance: > or = 4 METS        GI/Hepatic/Renal   negative GI/hepatic/renal ROS,      Endo/Other    hypothyroidism,       Neuro/Psych/MS    CVA, anxiety, depression, Substance abuse and marijuana     Cancer  negative hematology/oncology ROS,                  Physical Assessment      Patient summary reviewed and Nursing notes reviewed   Airway       Mallampati: II    TM distance: >3 FB    Neck ROM: full  Mouth Opening: good.  No Facial hair  No Beard  No endotracheal tube present  No Tracheostomy present    Dental           (+) edentulous           Pulmonary      (+) decreased breath sounds present        Cardiovascular    Rhythm: regular  Rate: Normal  (-) no friction rub, carotid bruit is not present, no peripheral edema and no murmur     Other findings            Plan  ASA 3     Planned anesthesia type: general    plan to administer opioids postoperatively          Intravenous induction     Anesthesia issues/risks discussed are: Stroke, Blood Loss, Sore Throat, Aspiration, Cardiac Events/MI, Intraoperative Awareness/ Recall, Difficult Airway, PONV and Nerve Injuries.  Anesthetic plan and risks discussed with patient.          Patient's NPO status is appropriate for Anesthesia.

## 2020-06-15 NOTE — Nurses Notes (Signed)
Pt off floor to OR with OR RN via stretcher

## 2020-06-15 NOTE — Care Management Notes (Signed)
CM completed e-mail to [employer] for payor source/ charity care.

## 2020-06-15 NOTE — Anesthesia Postprocedure Evaluation (Signed)
Anesthesia Post Op Evaluation    Patient: Kara Barnes  Procedure(s):  LAPAROSCOPY DIAGNOSTIC  OOPHORECTOMY LAPAROSCOPIC    Last Vitals:Temperature: 36.8 C (98.3 F) (06/15/20 1900)  Heart Rate: 85 (06/15/20 1900)  BP (Non-Invasive): (!) 106/55 (06/15/20 1900)  Respiratory Rate: 16 (06/15/20 1900)  SpO2: 91 % (06/15/20 9437)    No complications documented.    Patient is sufficiently recovered from the effects of anesthesia to participate in the evaluation and has returned to their pre-procedure level.  Patient location during evaluation: PACU       Patient participation: complete - patient participated  Level of consciousness: awake and alert and responsive to verbal stimuli    Pain score: 2  Pain management: adequate  Airway patency: patent    Anesthetic complications: no  Cardiovascular status: acceptable  Respiratory status: acceptable  Hydration status: acceptable  Patient post-procedure temperature: Pt Normothermic   PONV Status: Absent

## 2020-06-15 NOTE — Consults (Signed)
[facility]    Gynecology Consult      [name], [name], 49 y.o. female  Date of Admission:  06/13/2020  Date of service: 06/15/2020  Date of Birth:  Oct 08, 1971      Information Obtained from: patient and health care provider  Chief Complaint:  Abdominal pain    HPI:  Kara Barnes is a 49 y.o. [id number]   who presented to the emergency department with nausea, vomiting (yellow non-bloody)  and diarrhea (watery, bright green)  that started two weeks ago and has progressively gotten worse since onset. Associated symptoms on admission includesd generalized abdominal pain and low back pain which she ranks an 8/10. There has been no relief with OTC tylenol and ibuprofen. She had a CT done that suggested bilateral ovarian cyst and a f/u Sono confirmed bilateral large ovarian cysts.   She reports menopause x 5 years. No recent gyn care or routine screening for > than 5 years.      OB History   Gravida Para Term Preterm AB Living   3 3 3     2    SAB TAB Ectopic Multiple Live Births                  # Outcome Date GA Lbr Len/2nd Weight Sex Delivery Anes PTL Lv   3 Term            2 Term            1 Term              Past Medical History:   Diagnosis Date   . Anxiety    . Aortic valve disorder    . Arthropathy    . Cervical dysphagia 1995   . COPD (chronic obstructive pulmonary disease) (CMS HCC)    . Coronary artery disease    . CVA (cerebrovascular accident) (CMS [facility])    . Depression    . HTN (hypertension)    . Migraine    . Syncopal episodes    . Thyroid disease          Past Surgical History:   Procedure Laterality Date   . DENTAL SURGERY     . HX CESAREAN SECTION     . HX CHOLECYSTECTOMY     . SINUS SURGERY           Medications Prior to Admission     Prescriptions    acetaminophen (TYLENOL) 500 mg Oral Tablet    Take 500 mg by mouth Every 4 hours as needed for Pain    ALPRAZolam (XANAX) 2 mg Oral Tablet    Take 2 mg by mouth Three times a day as needed     escitalopram oxalate (LEXAPRO) 20  mg Oral Tablet    Take 20 mg by mouth Once a day    levothyroxine (SYNTHROID) 100 mcg Oral Tablet    Take 300 mcg by mouth Every morning    nortriptyline (PAMELOR) 10 mg Oral Capsule    Take 10 mg by mouth Every night        acetaminophen (TYLENOL) tablet, 1,000 mg, Oral, Now  ALPRAZolam (XANAX) tablet, 2 mg, Oral, 3x/day PRN  calcium carbonate (TUMS) chewable tablet, 500 mg, Oral, 3x/day PRN  diphenhydrAMINE (BENADRYL) 50 mg/mL injection, 25 mg, Intravenous, Q6H PRN  [Held by provider] enoxaparin (LOVENOX) 40 mg/0.4 mL SubQ injection, 40 mg, Subcutaneous, Daily  escitalopram (LEXAPRO) tablet, 20 mg, Oral, Daily  ibuprofen (MOTRIN) tablet, 800  mg, Oral, Q6H PRN  levothyroxine (SYNTHROID) tablet, 300 mcg, Oral, QAM  LR premix infusion, , Intravenous, Continuous  nicotine (NICODERM CQ) transdermal patch (mg/24 hr), 21 mg, Transdermal, Daily  nortriptyline (PAMELOR) capsule, 25 mg, Oral, NIGHTLY  NS flush syringe, 10 mL, Intravenous, Q8HRS  ondansetron (ZOFRAN) 2 mg/mL injection, 4 mg, Intravenous, Q8H PRN  oxyCODONE-acetaminophen (PERCOCET) 5-325mg  per tablet, 1 Tablet, Oral, Q4H PRN      Allergies   Allergen Reactions   . Penicillins Rash   . Ultram [Tramadol] Rash   . Flagyl [Metronidazole]  Other Adverse Reaction (Add comment)     Migraine      . Flexeril [Cyclobenzaprine]  Other Adverse Reaction (Add comment)     Migraine     . Toradol [Ketorolac]  Other Adverse Reaction (Add comment)     Migraine       Social History     Tobacco Use   . Smoking status: Current Every Day Smoker     Packs/day: 1.00   . Smokeless tobacco: Never Used   Vaping Use   . Vaping Use: Never used   Substance Use Topics   . Alcohol use: Not Currently   . Drug use: Yes     Types: Marijuana     Comment: 1 every 2 weeks     Family Medical History:    None           Height: 149.9 cm (4\' 11" )  Weight: 59 kg (130 lb)  BMI (Calculated): 26.31  Temperature: 36.3 C (97.3 F)  Heart Rate: 71  BP (Non-Invasive): (!) 139/59  Respiratory Rate: 17  SpO2:  97 %  No LMP recorded (lmp unknown).       ROS: MUST comment on all "Abnormal" findings   Other than ROS in the HPI, all other systems were negative.    PHYSICAL EXAMINATION: MUST comment on all "Abnormal" findings   General Appearance: alert, cooperative, no distress, appears stated age  Chest/Respiratory Exam: Normal.  Cardiovascular Exam: regular rate and rhythm, S1, S2 normal, no murmur, click, rub or gallop  Gastrointestinal Exam: soft, non-tender. Bowel sounds normal. No masses,  no organomegaly OR ASCITES  Pelvic Exam Female: Exam deferred.      DATA REVIEWED:  Labs:    I have reviewed all lab results.  Lab Results for Last 24 Hours:    Results for orders placed or performed during the hospital encounter of 06/13/20 (from the past 24 hour(s))   COMPREHENSIVE METABOLIC PANEL, NON-FASTING   Result Value Ref Range    SODIUM 142 136 - 145 mmol/L    POTASSIUM 3.4 (L) 3.5 - 5.1 mmol/L    CHLORIDE 108 96 - 111 mmol/L    CO2 TOTAL 25 22 - 30 mmol/L    ANION GAP 9 4 - 13 mmol/L    BUN 9 8 - 25 mg/dL    CREATININE 0.55 (L) 0.60 - 1.05 mg/dL    BUN/CREA RATIO 16 6 - 22    ESTIMATED GFR >90 >=60 mL/min/BSA    ALBUMIN 3.6 3.5 - 5.0 g/dL     CALCIUM 9.7 8.5 - 10.0 mg/dL    GLUCOSE 105 65 - 125 mg/dL    ALKALINE PHOSPHATASE 190 (H) 40 - 110 U/L    ALT (SGPT) 88 (H) 8 - 22 U/L    AST (SGOT)  123 (H) 8 - 45 U/L    BILIRUBIN TOTAL 0.7 0.3 - 1.3 mg/dL    PROTEIN TOTAL 6.5 6.4 - 8.3  g/dL   HIV1/HIV2 SCREEN, COMBINED ANTIGEN AND ANTIBODY   Result Value Ref Range    HIV SCREEN, COMBINED ANTIGEN & ANTIBODY Negative Negative   TYPE AND SCREEN   Result Value Ref Range    UNITS ORDERED NOT STATED         ABO/RH(D) PENDING     ANTIBODY SCREEN PENDING     SPECIMEN EXPIRATION DATE 06/18/2020      PAP [place]mere WILL OBTAINE IN OR    Reviewed/ Summarized old records: minimal reords available      Impression/Recommendations:  Large bilateral ovarian cysts - discussed with pt the indications risks and benefits or removal of these cyst and  possible removal of her ovaries. She is unlikely to benefit form relief of pain with persistent large ovarian cysts and theses are unlikely to resolve spontaneously given their size.     I have reviewed with her the limitations and particular risks of the surgery.   We discussed at length the indication, benefits and alternatives for the procedure as well as the risks of surgical intervention including but not limited to; bleeding, infection, damage to surrounding structures/organs (bowel, bladder, ureters, blood vessels, nerves), peri-operative risks including anesthesia, blood clots, heart attack, stroke, pneumonia, disability and death.  The patient verbalizes a reasonable understanding of her diagnosis and is in agreement with the plan of care.  All questions were answered. Patient signed consents today.    She is scheduled for Laparoscopic bilateral ovarian cystectomy, possible oophorectomy/salpingectomy, possible exlap.
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
## 2020-06-15 NOTE — Care Plan (Signed)
Problem: Adult Inpatient Plan of Care  Goal: Plan of Care Review  Outcome: Ongoing (Ceaira Ernster interventions/notes)  Goal: Patient-Specific Goal (Individualized)  Outcome: Ongoing (Tymier Lindholm interventions/notes)  Goal: Absence of Hospital-Acquired Illness or Injury  Outcome: Ongoing (Shaneka Efaw interventions/notes)  Goal: Optimal Comfort and Wellbeing  Outcome: Ongoing (Jevante Hollibaugh interventions/notes)  Goal: Rounds/Family Conference  Outcome: Ongoing (Raiyan Dalesandro interventions/notes)     Problem: Pain Acute  Goal: Acceptable Pain Control and Functional Ability  Outcome: Ongoing (Kamorie Aldous interventions/notes)     Problem: Fall Injury Risk  Goal: Absence of Fall and Fall-Related Injury  Outcome: Ongoing (Bishop Vanderwerf interventions/notes)

## 2020-06-15 NOTE — Nurses Notes (Signed)
Pt arrived on unit via stretched with PACU RN team

## 2020-06-15 NOTE — Progress Notes (Signed)
Pt is stable in PACU.  Surgery was w/o complications.  Pt d/w Dr Earley Brooke.   Routine postop care. Reg diet and ambulation encouraged. Pain mgt as indicated. [place] alone should not add to signIficant increased pain med requirements.

## 2020-06-15 NOTE — Progress Notes (Signed)
[facility]   Inpatient Family Medicine Progress Note    Name: Kara Barnes, Kara Barnes, 49 y.o. female Date of Service:  06/15/2020   Date of Birth:  February 06, 1971  MRN: [id number] Date of Admission:  06/13/2020   PCP: Pcp Not In System LOS: 0    Room: 250/A Attending: Wandra Feinstein, MD   Code Status: Full Code        Subjective:  NPO overnight in case of procedure today. Endorses improvement in abdominal pain with oral pain control. Continues to endorse tenderness to palpation of lower abdomen with fullness and discomfort. Tolerated PO prior to NPO status. Denies nausea, vomiting, constipation or diarrhea.       Objective:  Physical Examination:   Vitals:   Filed Vitals:    06/14/20 1537 06/14/20 1913 06/15/20 0445 06/15/20 0750   BP: 137/71 109/63 113/65 107/62   Pulse: 94 82 95 85   Resp: '20 20 16 18   ' Temp: 36 C (96.8 F) 36.8 C (98.2 F) 36.4 C (97.5 F) 36.3 C (97.4 F)   SpO2:           General:  patient is resting comfortably and is no distress  HEENT:  Head:  normocephalic; atraumatic   Eyes: PERR; EOM intact   Throat:  moist mucous membranes  Neck: neck supple; trachea midline  Cardiovascular:  RRR, normal S1/S2 with no gallop or rubs; no murmur  Respiratory:  CTA bilaterally; no crackles or wheezes   Abdomen: soft; distention unchanged from previous; normoactive bowel sounds in all 4 quadrants; diffuse tenderness with localized pain to the lower quadrants and lower back slightly improved from previous  Extremities: no LE edema bilaterally; no cyanosis bilaterally; +2/4 pedal pulses bilaterally; +2/4 posterior tibial pulses bilaterally   Skin: dry; normal turgor  Neurologic: alert and oriented X3; grossly normal   Psychiatric:  Pleasant and less anxious this AM; appears in good hygiene and appropriate mood      Labs:     CBC Differential   Recent Labs     06/13/20  2221 06/14/20  0658   WBC 14.2* 7.8   HGB 15.2 13.5   HCT 45.2 41.4   PLTCNT 292 203    Recent Labs     06/13/20  2221 06/14/20  0658    PMNS 65 60   MONOCYTES 5 7   BASOPHILS 0  <0.10 0  <0.10   PMNABS 9.26* 4.63   LYMPHSABS 3.97 2.50   MONOSABS 0.76 0.54   EOSABS <0.10 <0.10      BMP LFTs   Recent Labs     06/15/20  0722   SODIUM 142   POTASSIUM 3.4*   CHLORIDE 108   CO2 25   BUN 9   CREATININE 0.55*   GLUCOSENF 105   ANIONGAP 9   BUNCRRATIO 16   GFR >90   CALCIUM 9.7   ALBUMIN 3.6    Recent Labs     06/14/20  1248 06/15/20  0722   TOTALPROTEIN  --  6.5   ALBUMIN  --  3.6   TOTBILIRUBIN  --  0.7   AST  --  123*   ALT  --  88*   ALKPHOS  --  190*   GAMMAGT 316*  --       CoAgs Blood Gas:   No results found for this encounter No results found for this encounter    Cardiac Markers Lipid Panel   No results for input(s): TROPONINI, CKMB,  MBINDEX, BNP in the last 72 hours. No results found for this encounter   Urinalysis Other Labs   No results found for this encounter No results found for this encounter    Invalid input(s): PRL       CA 125 06/14/20 30.4  GGT 316  ESR/CRP/lactic acid WNL  Hepatitis A and B negative  Hep C equivocal      Intake/Output Summary (Last 24 hours) at 06/15/2020 1206  Last data filed at 06/15/2020 0930  Gross per 24 hour   Intake -   Output 400 ml   Net -400 ml       Radiology:        EKG:    No results found for this visit on 06/13/20 (from the past 720 hour(s)).      Patient Active Problem List    Diagnosis    . Ovarian cyst, bilateral    . Migraine    . Thyroid disease    . Depression    . Anxiety    . COPD (chronic obstructive pulmonary disease) (CMS HCC)    . Syncopal episodes    . Coronary artery disease    . HTN (hypertension)    . Aortic valve disorder    . Difficulty urinating    . Cervical dysphagia        Assessment:  Kara Barnes is a 49 y.o.Marland Kitchen female with HTN, cardiogenic syncope, COPD, hypothyroid, and anxiety that presented to [facility] with 2 weeks of vomiting and altered bowel habit, admitted for evaluation and management of bilateral ovarian cysts.       Plan:  #Nausea and vomiting, resolved  #Change in bowel  habit, stable  #Difficulty urinating, stable  #Bilateral ovarian cysts, stable  --Pain initially out of proportion to exam, appears consistent with findings on imaging this AM, however, there was initial consideration for mesenteric ischemia due to atherosclerotic disease on CT. No current history of bloody stools and pelvic crowding secondary to large cysts appears more likely at this time  --no leukocytosis  --CT with hepatomegaly, bilateral low-density adnexal masses with internal complexity and probably daughter cyst formation, then but enhancing septations present  --continue vitals q4h, I/O q4h, pulse ox q4h  --continue Zofran IV PRN for nausea  --patient refused IV Toradol  --continue alternate option of 823m ibuprofen q6h PRN  --increased tylenol to [id number]  --continue 1 tablet percocet q4PRN for moderate pain  --Pelvic US[place]oreaith bilateral cysts, unremarkable uterus; may require MRI with contrast due to size  --GYN consult    - planning for surgery today   - NPO  --CA125 high normal  --Lovenox held    #Hepatomegaly  #Elevated LFTs  --per CT imaging  --change in stool color to bright green concerning for cirrhosis  --unable to obtain liver USKoreaue to patient not being NPO  --new elevation on repeat labs following admission  --elevated GGT  --hepatitis panel and HIV   - HIV pending   - Hep A and B negative   - Hep C equivocal, repeat today to confirm, then repeat outpatient  --Liver USKoreardered  --Additional imaging as needed      Chronic Medical Conditions:   Anxiety - continue home xanax 2.5 TID PRN  Migraines - continue home nortriptyline 2579mightly, in place of 24m57mD at home  Depression - continue home Lexapro 24mg19mly  Hypothyroidism - continue home synthroid 100mcg65mly  Cervical dysphagia - currently tolerating PO   Tobacco use  disorder - continue 33m nicotine patch daily  CAD/HTN - no medications reported  AV valve disorder/Syncopal episodes - cardiogenic, per patient report; denies current  medication use    Consults - None  Hardware (Lines, Drains, Foley, Tubes) - IV  Diet - Regular  Activity - Up Ad-Lib  Therapy - None  Disposition Planning - Home Discharge  DVT/PE Prophylaxis - Lovenox      Evaluated patient and discussed treatment plan with attending physician, Dr. CWandra Feinstein MD.    /Crissie Figures MD 06/15/2020, 09:35  PGY-2  [facility]    Portions of this note may be dictated using voice recognition software or a dictation service. Variances in spelling and vocabulary are possible and unintentional. Not all errors are caught/corrected. Please notify the [facility] any discrepancies are noted or if the meaning of any statement is not clear.                I saw and examined the patient.  I reviewed the resident's note.  I agree with the findings and plan of care as documented in the resident's note.  Any exceptions/additions are edited/noted.    AWandra Feinstein MD

## 2020-06-16 ENCOUNTER — Observation Stay (HOSPITAL_BASED_OUTPATIENT_CLINIC_OR_DEPARTMENT_OTHER): Payer: Medicaid - Out of State

## 2020-06-16 DIAGNOSIS — Z90722 Acquired absence of ovaries, bilateral: Secondary | ICD-10-CM | POA: Diagnosis present

## 2020-06-16 DIAGNOSIS — R160 Hepatomegaly, not elsewhere classified: Secondary | ICD-10-CM

## 2020-06-16 DIAGNOSIS — K760 Fatty (change of) liver, not elsewhere classified: Secondary | ICD-10-CM

## 2020-06-16 LAB — CBC WITH DIFF
BASOPHIL #: <0.1 10*3/uL (ref <=0.20)
BASOPHIL %: 0 %
EOSINOPHIL #: <0.1 10*3/uL (ref <=0.50)
EOSINOPHIL %: 0 %
HCT: 38.2 % (ref 34.8–46.0)
HGB: 12.6 g/dL (ref 11.5–16.0)
IMMATURE GRANULOCYTE #: <0.1 10*3/uL (ref <0.10)
IMMATURE GRANULOCYTE %: 1 % (ref 0–1)
LYMPHOCYTE #: 1.13 10*3/uL (ref 1.00–4.80)
LYMPHOCYTE %: 13 %
MCH: 28.5 pg (ref 26.0–32.0)
MCHC: 33 g/dL (ref 31.0–35.5)
MCV: 86.4 fL (ref 78.0–100.0)
MONOCYTE #: 0.29 10*3/uL (ref 0.20–1.10)
MONOCYTE %: 3 %
MPV: 9.8 fL (ref 8.7–12.5)
NEUTROPHIL #: 7.53 10*3/uL (ref 1.50–7.70)
NEUTROPHIL %: 83 %
PLATELETS: 206 10*3/uL (ref 150–400)
RBC: 4.42 10*6/uL (ref 3.85–5.22)
RDW-CV: 12 % (ref 11.5–15.5)
WBC: 9 10*3/uL (ref 3.7–11.0)

## 2020-06-16 LAB — COMPREHENSIVE METABOLIC PNL, FASTING
ALBUMIN: 3.4 g/dL — ABNORMAL LOW (ref 3.5–5.0)
ALKALINE PHOSPHATASE: 203 U/L — ABNORMAL HIGH (ref 40–110)
ALT (SGPT): 139 U/L — ABNORMAL HIGH (ref 8–22)
ANION GAP: 10 mmol/L (ref 4–13)
AST (SGOT): 75 U/L — ABNORMAL HIGH (ref 8–45)
BILIRUBIN TOTAL: 0.4 mg/dL (ref 0.3–1.3)
BUN/CREA RATIO: 11 (ref 6–22)
BUN: 6 mg/dL — ABNORMAL LOW (ref 8–25)
CALCIUM: 9.8 mg/dL (ref 8.5–10.0)
CHLORIDE: 108 mmol/L (ref 96–111)
CO2 TOTAL: 25 mmol/L (ref 22–30)
CREATININE: 0.53 mg/dL — ABNORMAL LOW (ref 0.60–1.05)
ESTIMATED GFR: >90 mL/min/BSA (ref >=60)
GLUCOSE: 117 mg/dL — ABNORMAL HIGH (ref 70–99)
POTASSIUM: 3.9 mmol/L (ref 3.5–5.1)
PROTEIN TOTAL: 6.2 g/dL — ABNORMAL LOW (ref 6.4–8.3)
SODIUM: 143 mmol/L (ref 136–145)

## 2020-06-16 LAB — THYROID STIMULATING HORMONE WITH FREE T4 REFLEX: TSH: <0.008 u[IU]/mL — ABNORMAL LOW (ref 0.430–3.550)

## 2020-06-16 LAB — THYROXINE, FREE (FREE T4): THYROXINE (T4), FREE: 1.99 ng/dL — ABNORMAL HIGH (ref 0.70–1.25)

## 2020-06-16 LAB — HPV HIGH RISK REFLEX TO GENOTYPE - BMC/JMC ONLY: HPV RNA PCR: POSITIVE — AB

## 2020-06-16 MED ORDER — LEVOTHYROXINE 100 MCG TABLET
200.0000 ug | ORAL_TABLET | Freq: Every morning | ORAL | Status: DC
Start: 2020-06-17 — End: 2020-06-17
  Administered 2020-06-17: 200 ug via ORAL
  Filled 2020-06-16: qty 2

## 2020-06-16 MED ORDER — ALBUTEROL SULFATE HFA 90 MCG/ACTUATION AEROSOL INHALATION - JMH
2.0000 | INHALATION_SPRAY | Freq: Four times a day (QID) | RESPIRATORY_TRACT | Status: DC | PRN
Start: 2020-06-16 — End: 2020-06-17

## 2020-06-16 MED ORDER — OXYCODONE-ACETAMINOPHEN 5 MG-325 MG TABLET
1.0000 | ORAL_TABLET | ORAL | Status: AC
Start: 2020-06-16 — End: 2020-06-16
  Administered 2020-06-16: 1 via ORAL
  Filled 2020-06-16: qty 1

## 2020-06-16 MED ORDER — POLYETHYLENE GLYCOL 3350 17 GRAM/DOSE ORAL POWDER
17.0000 g | Freq: Every day | ORAL | Status: DC
Start: 2020-06-17 — End: 2020-06-17
  Administered 2020-06-17: 17 g via ORAL
  Filled 2020-06-16: qty 42

## 2020-06-16 MED ORDER — OXYCODONE-ACETAMINOPHEN 5 MG-325 MG TABLET
1.0000 | ORAL_TABLET | Freq: Three times a day (TID) | ORAL | Status: DC | PRN
Start: 2020-06-16 — End: 2020-06-17
  Administered 2020-06-16 – 2020-06-17 (×3): 1 via ORAL
  Filled 2020-06-16 (×4): qty 1

## 2020-06-16 NOTE — Progress Notes (Signed)
Interval Progress Note:  Called [facility] line for GI consult based on US liver recommendations regarding possible need for MRCP. GI ([facility]) accepted transfer for MRCP. Accepted by hospitalist ([facility]) as well, and she was placed on the wait list. Will transfer for continued workup, when a bed becomes available.    Updated patient on recommendations and she was amendable to transfer.  Crissie Figures, MD  06/16/2020, 17:42      I discussed the patient with the resident in person.  I reviewed the resident's note.  I agree with the findings and plan of care as documented in the resident's note.  Any exceptions/additions are edited/noted.    Wandra Feinstein, MD  06/19/2020, 17:40
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
## 2020-06-16 NOTE — Progress Notes (Signed)
[facility]   Inpatient Family Medicine Progress Note    Name: Kara Barnes, Kara Barnes, 49 y.o. female Date of Service:  06/16/2020   Date of Birth:  [birth year]  MRN: [id number] Date of Admission:  06/13/2020   PCP: Pcp Not In System LOS: 0    Room: 250/A Attending: Wandra Feinstein, MD   Code Status: Full Code        Subjective: Surgery uncomplicated yesterday. Today, she endorses post-surgical pain but overall improvement. Denies SOB, nausea, vomiting, difficulty urinating, diarrhea or constipation. States that she would like copies of her records for her medical files, as she is in the process of filing for disability in [place].     Objective:  Physical Examination:   Vitals:   Filed Vitals:    06/15/20 1809 06/15/20 1827 06/15/20 1900 06/16/20 0202   BP: (!) 145/75 134/86 (!) 106/55 124/61   Pulse: 71 88 85 94   Resp: (!) 10 (!) _0 Temp:   36.8 C (98.3 F) 36.7 C (98.1 F)   SpO2: 91%  90% 92%       General:  patient is resting comfortably and is no distress  HEENT:  Head:  normocephalic; atraumatic   Eyes: PERR; EOM intact   Throat:  moist mucous membranes  Neck: neck supple; trachea midline  Cardiovascular:  RRR, normal S1/S2 with no gallop or rubs; no murmur  Respiratory:  CTA bilaterally; no crackles or wheezes   Abdomen: soft; non-distended; normoactive bowel sounds in all 4 quadrants; umbilical incision and 2 lower quadrant incisions, clean, dry and intact with local tenderness  Extremities: no LE edema bilaterally; no cyanosis bilaterally; +2/4 pedal pulses bilaterally; +2/4 posterior tibial pulses bilaterally   Skin: dry; normal turgor  Neurologic: alert and oriented X3; grossly normal   Psychiatric:  Pleasant and talkative; appears in good hygiene and appropriate mood      Labs:     CBC Differential   Recent Labs     06/13/20  2221 06/14/20  0658   WBC 14.2* 7.8   HGB 15.2 13.5   HCT 45.2 41.4   PLTCNT 292 203    Recent Labs     06/13/20  2221 06/14/20  0658   PMNS 65 60   MONOCYTES 5 7    BASOPHILS 0   <0.10 0   <0.10   PMNABS 9.26* 4.63   LYMPHSABS 3.97 2.50   MONOSABS 0.76 0.54   EOSABS <0.10 <0.10      BMP LFTs   No results found for this encounter No results found for this encounter   CoAgs Blood Gas:   No results found for this encounter No results found for this encounter    Cardiac Markers Lipid Panel   No results for input(s): TROPONINI, CKMB, MBINDEX, BNP in the last 72 hours. No results found for this encounter   Urinalysis Other Labs   No results found for this encounter No results found for this encounter    Invalid input(s): PRL     06/14/20   CA125 30.4  GGT 316  ESR/CRP/lactic acid WNL  Hepatitis A and B negative  Hep C equivocal    06/15/20  HIV negative  Hep C equivocal       Intake/Output Summary (Last 24 hours) at 06/16/2020 0735  Last data filed at 06/15/2020 1830  Gross per 24 hour   Intake 1100 ml   Output 900 ml   Net 200 ml  Radiology:   Results for orders placed or performed during the hospital encounter of 06/13/20 (from the past 24 hour(s))   OR SCOPE GYN     Status: None    Narrative    *Procedure not read by radiology.  *Refer to procedure note for result.       US LIVER performed on 06/16/2020 6:58 AM.     REASON FOR EXAM:  Hepatomegaly, elevated LFTs        TECHNIQUE: EXAMINATION: Right upper quadrant ultrasound performed on  06/16/2020.     INDICATION: Right upper quadrant pain.     COMPARISON: CT of 06/13/2020     FINDINGS: The liver is prominent and shows the  diffuse increased  echogenicity consistent with hepatomegaly/steatosis. There is no evidence  of masses, acute abnormalities or bile duct dilation. The portal vein is of  normal caliber and demonstrates appropriate hepatopedal flow. Common bile  duct is prominent at 11 mm diameter. This may be in part result of previous  cholecystectomy (compensatory dilatation). No definite intraluminal defects  are found. If clinically relevant the addition of MRCP should be considered  . Status post cholecystectomy. The  right kidney demonstrates normal  parenchymal thickness and echogenicity, and shows no evidence of masses,  stones or hydronephrosis. .     IMPRESSION:  Hepatomegaly and steatosis     Status post cholecystectomy and prominent common bile duct.         EKG:    No results found for this visit on 06/13/20 (from the past 720 hour(s)).      Patient Active Problem List    Diagnosis     Post-operative state     Ovarian cyst, bilateral     Migraine     Thyroid disease     Depression     Anxiety     COPD (chronic obstructive pulmonary disease) (CMS HCC)     Syncopal episodes     Coronary artery disease     HTN (hypertension)     Aortic valve disorder     Difficulty urinating     Cervical dysphagia        Assessment:  Kara Barnes is a 49 y.o.Kara Barnes female with HTN, cardiogenic syncope, COPD, hypothyroid, and anxiety that presented to [facility] with 2 weeks of vomiting and altered bowel habit, admitted for evaluation and management of bilateral ovarian cysts. She is now s/p salpingectomy and oophorectomy. Discharge planning in progress.       Plan:  #Nausea and vomiting, resolved  #Change in bowel habit, resolved  #Difficulty urinating, resolved  #Bilateral ovarian cysts, resolved  #s/p bilateral salpingo-oophorectomy  POD#1  --Pain initially out of proportion to exam, appears consistent with findings on imaging this AM, however, there was initial consideration for mesenteric ischemia due to atherosclerotic disease on CT. No current history of bloody stools and pelvic crowding secondary to large cysts appears more likely at this time  --no leukocytosis  --CT with hepatomegaly, bilateral low-density adnexal masses with internal complexity and probably daughter cyst formation, then but enhancing septations present  --continue vitals q4h, I/O q4h, pulse ox q4h  --continue Zofran IV PRN for nausea  --patient refused IV Toradol  --continue alternate option of 833m ibuprofen q6h PRN  --increased tylenol to [id number]  --continue 1 tablet  percocet q4PRN for moderate pain  --Pelvic USKoreaith bilateral cysts, unremarkable uterus; may require MRI with contrast due to size  --GYN consult    - surgery completed 06/16/20   -  Pap smear and pathology pending  --CA125 high normal  --Lovenox held; restart when cleared by surgery    #Hepatomegaly  #Elevated LFTs  --per CT imaging  --change in stool color to bright green concerning for cirrhosis  --unable to obtain liver US due to patient not being NPO  --new elevation on repeat labs following admission  --elevated GGT  --hepatitis panel and HIV   - HIV negative   - Hep A and B negative   - Hep C equivocal, repeat also equivocal; follow-up outpatient  --Liver US with hepatomegaly and steatosis, may require MRCP  --MARS line for GI consult to consider MRCP while inpatient    #Hypothyroidism  --continue home synthroid 340mg daily  --TSH <0.0008  --H&P with 1067m, will confirm dose with patient and adjust; dose will still need to be titrated down based on TSH level    Social concerns -- consult SW for housing issues; patient may also need records release for [place]    Chronic Medical Conditions:   Anxiety - continue home xanax 2.5 TID PRN  Migraines - continue home nortriptyline 2517mightly, in place of 17m47mD at home  Depression - continue home Lexapro 17mg86mly  Cervical dysphagia - currently tolerating PO   Tobacco use disorder - continue 21mg 45mtine patch daily  CAD/HTN - no medications reported  AV valve disorder/Syncopal episodes - cardiogenic, per patient report; denies current medication use    Consults - None  Hardware (Lines, Drains, Foley, Tubes) - IV  Diet - Regular  Activity - Up Ad-Lib  Therapy - None  Disposition Planning - Home Discharge  DVT/PE Prophylaxis - Lovenox      Evaluated patient and discussed treatment plan with attending physician, Dr. CherryWandra Feinstein   /BreonCrissie Figures/10/2020, 09:35  PGY-2   [facility]    Portions of this note may be dictated  using voice recognition software or a dictation service. Variances in spelling and vocabulary are possible and unintentional. Not all errors are caught/corrected. Please notify the authorPryor Curiay discrepancies are noted or if the meaning of any statement is not clear.                Late entry for 06/16/20. I saw and examined the patient.  I reviewed the resident's note.  I agree with the findings and plan of care as documented in the resident's note.  Any exceptions/additions are edited/noted.    AngelaWandra Feinstein

## 2020-06-16 NOTE — Care Plan (Signed)
Problem: Adult Inpatient Plan of Care  Goal: Plan of Care Review  Outcome: Ongoing (see interventions/notes)  Goal: Patient-Specific Goal (Individualized)  Outcome: Ongoing (see interventions/notes)  Goal: Absence of Hospital-Acquired Illness or Injury  Outcome: Ongoing (see interventions/notes)  Goal: Optimal Comfort and Wellbeing  Outcome: Ongoing (see interventions/notes)  Goal: Rounds/Family Conference  Outcome: Ongoing (see interventions/notes)     Problem: Pain Acute  Goal: Acceptable Pain Control and Functional Ability  Outcome: Ongoing (see interventions/notes)     Problem: Fall Injury Risk  Goal: Absence of Fall and Fall-Related Injury  Outcome: Ongoing (see interventions/notes)

## 2020-06-16 NOTE — Nurses Notes (Signed)
Percocet 1 given per pt request and pain 8/10. Requested Benadryl at this time as well since the Percocet makes her itch. Informed Benadryl is every 6 hours and is due at 2. Requested med be given even if she is asleep. Pt informed she would have to be awakened for medications. Stated that was fine because she really needs it.

## 2020-06-16 NOTE — Care Management Notes (Addendum)
COVID neg 08/09    MARS INTAKE    PATIENT INFORMATION    PCP: Pcp Not In System  Effective Insurance:   Payor/Plan Subscr Relation Effective Group Num   1. MEDICAID [place] 06/05/20                                    [address] [address], [facility] [place] [address]       Insurance Comments:     RESERVATION INFORMATION  Received call from:   Phone:   Referring Provider: Crissie Figures         Referring Provider Phone: [phone]    Transfer Source: [facility]     Transfer Emergent:  Urgent  Transfer Reason: Service not available at Current Facility  Transfer Comments: needs MRCP or ERCP  Admitted on: 06/13/2020   Pt Class and Level of Care: Inpatient Acute  Diagnosis: Common bile duct dilated       CLINICAL INFORMATION  Intubated: No  COVID-19 Confirmed Positive:no  COVID-19 Pending Result (PUI) (if yes, add date test was sent to lab if known): no    PMH:    Past Medical History:   Diagnosis Date   . Anxiety    . Aortic valve disorder    . Arthropathy    . Cervical dysphagia 1995   . COPD (chronic obstructive pulmonary disease) (CMS HCC)    . Coronary artery disease    . CVA (cerebrovascular accident) (CMS [place])    . Depression    . HTN (hypertension)    . Migraine    . Syncopal episodes    . Thyroid disease      Dialysis: no  Cancer: no  Isolation: no  Is patient established with family practice/attending?      Patient story/clinical presentation:08/09 admitted with large ovarian cysts and abd pain --upon workup of labs and imaging including CT scan of CAP and Abd [place] , CBD dilatation and hepatomegaly found--Barr MD requesting transfer for possible MRCP, Thakkar MD (GI) notified of patient's status and Barr's request --then Lyn[place]urst MD (MARS) notified of Thakkar's request to admit for possible MRCP--accepted to Medicine service     qSOFA Score >= 2 = Positive                 Value                   Score    Respiratory Rate >= 22 breaths per minute = 1 point 16 0   Systolic Blood Pressure <= 100  mmHg = 1 point 116 0   Altered Mental Status: GCS <15 = 1 point no 0   Total Score   0     Serum Lactate Level >= 2mol/L (36 mg/ dL)= Positive:  Initial call level (if not available put NA) NA   Followed up call level          Positive Screen: no    Current vital signs:    HR:     84   BP:     116/55   Resp:     16   Temp:     37.1   Sats:     92   O2:     Ra       Per MD labs WNL unless noted below.    Labs:  WBC (3.6-11)  HGB (13.1-17.3)    Hct (39.8-50.2)    PLT (140-400)    Na (135-145)    K+ (3.5-5.1)    CL (96-111)    CO2 (23-35)    BUN (8-26)    Cr (0.62-1.27)    Glucose (60-105)    Ca (8.5-10.4)    Mag (1.6-2.5)    Phos (2.4-4.7)    BNP (<100)    D-Dimer (<233)    AST (8-41) 75   ALT (<55) 139   Alk Phos (<150) 203   Amylase (25-125)    Lipase (10-80)    T. Bili (0.3-1.3) 0.4   PT (8.7-13.2)    PTT (25.1-36.5)    INR (0.8-1.2)    Trop-1 (<0.03)    CK    CPK    CK-MB    ABG ph    ABG PCO2    ABG PO2    ABG HCO3    Base def/exc    LP Glucose    LP Neutrophil    LP Protein    LP WBC    LP Other            Radiology (please place images on image grid):  EKG:  IV Access:  Medications, IVF, Drips:     Admitting Pt Class and Level of Care: Inpatient, Semi-Private,   Accepted By: Nonie Hoyer, HOSPITALIST 61    *Send Text Page to accepting service MD. *     PATIENT UPDATE    Date/time of update:  06/16/20 @ 2152  Update received from (name/title):  Maudie Mercury, RN    Vital Signs -  T98.3  P81  R18  B/P128/63  Pertinent critical labs: n/a  Any change in oxygen requirements:  no, 99% RA  Any change mental status (alert, lethargic, obtunded, ect.):  no    Does patient still require transfer?  If not, why? (left AMA, went to a different facility, etc.):  yes

## 2020-06-16 NOTE — Nurses Notes (Signed)
Per Dr. Rise Patience, the pt can be transferred to [facility] unit for further care.    Leota Jacobsen, RN  Nurse Supervisor

## 2020-06-16 NOTE — Nurses Notes (Signed)
States pain still 7/10 after motrin, percocet. Dr Rise Patience aware. Pt informed that this is the pain meds that were ordered. States she can wait until they are due again. States she is always in pain.

## 2020-06-17 ENCOUNTER — Observation Stay (HOSPITAL_COMMUNITY): Payer: Medicaid - Out of State | Admitting: Internal Medicine

## 2020-06-17 ENCOUNTER — Observation Stay
Admission: AD | Admit: 2020-06-17 | Discharge: 2020-06-21 | Disposition: A | Discharge status: Home / Self Care | Payer: Medicaid - Out of State | Source: Other Acute Inpatient Hospital | Attending: Internal Medicine | Admitting: Internal Medicine

## 2020-06-17 ENCOUNTER — Encounter (HOSPITAL_COMMUNITY): Payer: Self-pay | Admitting: Internal Medicine

## 2020-06-17 DIAGNOSIS — R7401 Elevation of levels of liver transaminase levels: Secondary | ICD-10-CM

## 2020-06-17 DIAGNOSIS — F329 Major depressive disorder, single episode, unspecified: Secondary | ICD-10-CM | POA: Insufficient documentation

## 2020-06-17 DIAGNOSIS — E079 Disorder of thyroid, unspecified: Secondary | ICD-10-CM

## 2020-06-17 DIAGNOSIS — D279 Benign neoplasm of unspecified ovary: Secondary | ICD-10-CM | POA: Insufficient documentation

## 2020-06-17 DIAGNOSIS — Z029 Encounter for administrative examinations, unspecified: Secondary | ICD-10-CM

## 2020-06-17 DIAGNOSIS — R160 Hepatomegaly, not elsewhere classified: Secondary | ICD-10-CM | POA: Insufficient documentation

## 2020-06-17 DIAGNOSIS — G43909 Migraine, unspecified, not intractable, without status migrainosus: Secondary | ICD-10-CM | POA: Insufficient documentation

## 2020-06-17 DIAGNOSIS — F32A Depression, unspecified: Secondary | ICD-10-CM

## 2020-06-17 DIAGNOSIS — E0580 Other thyrotoxicosis without thyrotoxic crisis or storm: Secondary | ICD-10-CM | POA: Diagnosis present

## 2020-06-17 DIAGNOSIS — Z79899 Other long term (current) drug therapy: Secondary | ICD-10-CM | POA: Insufficient documentation

## 2020-06-17 DIAGNOSIS — F419 Anxiety disorder, unspecified: Secondary | ICD-10-CM | POA: Insufficient documentation

## 2020-06-17 DIAGNOSIS — D270 Benign neoplasm of right ovary: Secondary | ICD-10-CM

## 2020-06-17 DIAGNOSIS — J449 Chronic obstructive pulmonary disease, unspecified: Secondary | ICD-10-CM

## 2020-06-17 DIAGNOSIS — I10 Essential (primary) hypertension: Secondary | ICD-10-CM

## 2020-06-17 DIAGNOSIS — K838 Other specified diseases of biliary tract: Principal | ICD-10-CM | POA: Insufficient documentation

## 2020-06-17 DIAGNOSIS — R1011 Right upper quadrant pain: Secondary | ICD-10-CM | POA: Insufficient documentation

## 2020-06-17 DIAGNOSIS — D271 Benign neoplasm of left ovary: Secondary | ICD-10-CM

## 2020-06-17 DIAGNOSIS — E032 Hypothyroidism due to medicaments and other exogenous substances: Secondary | ICD-10-CM | POA: Insufficient documentation

## 2020-06-17 DIAGNOSIS — F17210 Nicotine dependence, cigarettes, uncomplicated: Secondary | ICD-10-CM | POA: Insufficient documentation

## 2020-06-17 DIAGNOSIS — Z79890 Hormone replacement therapy (postmenopausal): Secondary | ICD-10-CM | POA: Insufficient documentation

## 2020-06-17 HISTORY — DX: Post-traumatic stress disorder, unspecified: F43.10

## 2020-06-17 LAB — CBC WITH DIFF
BASOPHIL #: <0.1 10*3/uL (ref <=0.20)
BASOPHIL %: 0 %
EOSINOPHIL #: <0.1 10*3/uL (ref <=0.50)
EOSINOPHIL %: 0 %
HCT: 41.3 % (ref 34.8–46.0)
HGB: 12.2 g/dL (ref 11.5–16.0)
IMMATURE GRANULOCYTE #: <0.1 10*3/uL (ref <0.10)
IMMATURE GRANULOCYTE %: 1 % (ref 0–1)
LYMPHOCYTE #: 3.25 10*3/uL (ref 1.00–4.80)
LYMPHOCYTE %: 38 %
MCH: 26.6 pg (ref 26.0–32.0)
MCHC: 29.5 g/dL — ABNORMAL LOW (ref 31.0–35.5)
MCV: 90.2 fL (ref 78.0–100.0)
MONOCYTE #: 0.45 10*3/uL (ref 0.20–1.10)
MONOCYTE %: 5 %
MPV: 10.2 fL (ref 8.7–12.5)
NEUTROPHIL #: 4.73 10*3/uL (ref 1.50–7.70)
NEUTROPHIL %: 56 %
PLATELETS: 203 10*3/uL (ref 150–400)
RBC: 4.58 10*6/uL (ref 3.85–5.22)
RDW-CV: 13.2 % (ref 11.5–15.5)
WBC: 8.6 10*3/uL (ref 3.7–11.0)

## 2020-06-17 LAB — COMPREHENSIVE METABOLIC PANEL, NON-FASTING
ALBUMIN: 3.3 g/dL — ABNORMAL LOW (ref 3.5–5.0)
ALKALINE PHOSPHATASE: 179 U/L — ABNORMAL HIGH (ref 40–110)
ALT (SGPT): 84 U/L — ABNORMAL HIGH (ref 8–22)
ANION GAP: 7 mmol/L (ref 4–13)
AST (SGOT): 27 U/L (ref 8–45)
BILIRUBIN TOTAL: 0.3 mg/dL (ref 0.3–1.3)
BUN/CREA RATIO: 27 — ABNORMAL HIGH (ref 6–22)
BUN: 14 mg/dL (ref 8–25)
CALCIUM: 9.2 mg/dL (ref 8.5–10.0)
CHLORIDE: 110 mmol/L (ref 96–111)
CO2 TOTAL: 26 mmol/L (ref 22–30)
CREATININE: 0.52 mg/dL — ABNORMAL LOW (ref 0.60–1.05)
ESTIMATED GFR: >90 mL/min/BSA (ref >=60)
GLUCOSE: 96 mg/dL (ref 65–125)
POTASSIUM: 3.5 mmol/L (ref 3.5–5.1)
PROTEIN TOTAL: 6 g/dL — ABNORMAL LOW (ref 6.4–8.3)
SODIUM: 143 mmol/L (ref 136–145)

## 2020-06-17 LAB — CYTOPATHOLOGY, NON GYN

## 2020-06-17 LAB — LIPID PANEL
CHOL/HDL RATIO: 4.1
CHOLESTEROL: 160 mg/dL (ref 100–200)
HDL CHOL: 39 mg/dL — ABNORMAL LOW (ref >=50)
LDL CALC: 74 mg/dL (ref <100)
NON-HDL: 121 mg/dL (ref <=190)
TRIGLYCERIDES: 235 mg/dL — ABNORMAL HIGH (ref <150)
VLDL CALC: 47 mg/dL — ABNORMAL HIGH (ref <30)

## 2020-06-17 LAB — HGA1C (HEMOGLOBIN A1C WITH EST AVG GLUCOSE)
ESTIMATED AVERAGE GLUCOSE: 108 mg/dL
HEMOGLOBIN A1C: 5.4 % (ref 4.0–5.6)

## 2020-06-17 LAB — SURGICAL PATHOLOGY SPECIMEN

## 2020-06-17 LAB — VITAMIN D 25 TOTAL: VITAMIN D 25, TOTAL: 19.6 ng/mL — ABNORMAL LOW (ref 30.0–100.0)

## 2020-06-17 MED ORDER — ALBUTEROL SULFATE 2.5 MG/3 ML (0.083 %) SOLUTION FOR NEBULIZATION
2.5000 mg | INHALATION_SOLUTION | RESPIRATORY_TRACT | Status: DC | PRN
Start: 2020-06-17 — End: 2020-06-21

## 2020-06-17 MED ORDER — LEVOTHYROXINE 100 MCG TABLET
200.0000 ug | ORAL_TABLET | Freq: Every morning | ORAL | Status: DC
Start: 2020-06-18 — End: 2020-06-18
  Filled 2020-06-17: qty 2

## 2020-06-17 MED ORDER — ENOXAPARIN 40 MG/0.4 ML SUBCUTANEOUS SYRINGE
40.0000 mg | INJECTION | SUBCUTANEOUS | Status: DC
Start: 2020-06-17 — End: 2020-06-21
  Administered 2020-06-17 – 2020-06-20 (×4): 0 mg via SUBCUTANEOUS
  Filled 2020-06-17 (×2): qty 0.4

## 2020-06-17 MED ORDER — SODIUM CHLORIDE 0.9 % (FLUSH) INJECTION SYRINGE
2.0000 mL | INJECTION | Freq: Three times a day (TID) | INTRAMUSCULAR | Status: DC
Start: 2020-06-17 — End: 2020-06-21
  Administered 2020-06-17: 0 mL
  Administered 2020-06-17: 5 mL
  Administered 2020-06-18: 2 mL
  Administered 2020-06-18: 6 mL
  Administered 2020-06-19: 4 mL
  Administered 2020-06-19: 0 mL
  Administered 2020-06-19: 2 mL
  Administered 2020-06-20 (×2): 0 mL
  Administered 2020-06-20: 5 mL

## 2020-06-17 MED ORDER — LEVOTHYROXINE 100 MCG TABLET
200.0000 ug | ORAL_TABLET | Freq: Every morning | ORAL | Status: DC
Start: 2020-06-17 — End: 2020-06-21

## 2020-06-17 MED ORDER — NICOTINE 21 MG/24 HR DAILY TRANSDERMAL PATCH
42.0000 mg | MEDICATED_PATCH | Freq: Every day | TRANSDERMAL | Status: DC
Start: 2020-06-18 — End: 2020-06-21
  Administered 2020-06-18 – 2020-06-21 (×4): 42 mg via TRANSDERMAL
  Filled 2020-06-17 (×4): qty 2

## 2020-06-17 MED ORDER — SENNOSIDES 8.6 MG-DOCUSATE SODIUM 50 MG TABLET
1.0000 | ORAL_TABLET | Freq: Two times a day (BID) | ORAL | Status: DC
Start: 2020-06-18 — End: 2020-06-21
  Administered 2020-06-18 (×2): 1 via ORAL
  Administered 2020-06-19 – 2020-06-21 (×6): 0 via ORAL
  Filled 2020-06-17 (×3): qty 1

## 2020-06-17 MED ORDER — IPRATROPIUM 0.5 MG-ALBUTEROL 3 MG (2.5 MG BASE)/3 ML NEBULIZATION SOLN
3.0000 mL | INHALATION_SOLUTION | Freq: Four times a day (QID) | RESPIRATORY_TRACT | Status: DC
Start: 2020-06-17 — End: 2020-06-17

## 2020-06-17 MED ORDER — ACETAMINOPHEN 325 MG TABLET
650.0000 mg | ORAL_TABLET | Freq: Four times a day (QID) | ORAL | Status: DC | PRN
Start: 2020-06-17 — End: 2020-06-18
  Administered 2020-06-17: 650 mg via ORAL
  Filled 2020-06-17 (×2): qty 2

## 2020-06-17 MED ORDER — POLYETHYLENE GLYCOL 3350 17 GRAM ORAL POWDER PACKET
17.0000 g | Freq: Every day | ORAL | Status: DC
Start: 2020-06-17 — End: 2020-06-21
  Administered 2020-06-17: 17 g via ORAL
  Administered 2020-06-18 – 2020-06-21 (×4): 0 g via ORAL
  Filled 2020-06-17 (×2): qty 1

## 2020-06-17 MED ORDER — ALPRAZOLAM 0.5 MG TABLET
2.0000 mg | ORAL_TABLET | Freq: Three times a day (TID) | ORAL | Status: DC
Start: 2020-06-17 — End: 2020-06-19
  Administered 2020-06-17 – 2020-06-18 (×4): 2 mg via ORAL
  Administered 2020-06-19: 0 mg via ORAL
  Filled 2020-06-17 (×2): qty 4
  Filled 2020-06-17: qty 1
  Filled 2020-06-17: qty 3
  Filled 2020-06-17: qty 4

## 2020-06-17 MED ORDER — NICOTINE (POLACRILEX) 2 MG BUCCAL LOZENGE
2.0000 mg | LOZENGE | BUCCAL | Status: DC | PRN
Start: 2020-06-17 — End: 2020-06-21
  Administered 2020-06-17: 2 mg via ORAL
  Filled 2020-06-17: qty 24

## 2020-06-17 MED ORDER — ESCITALOPRAM 20 MG TABLET
20.0000 mg | ORAL_TABLET | Freq: Every day | ORAL | Status: DC
Start: 2020-06-18 — End: 2020-06-21
  Administered 2020-06-18 – 2020-06-21 (×4): 20 mg via ORAL
  Filled 2020-06-17 (×4): qty 1

## 2020-06-17 MED ORDER — SODIUM CHLORIDE 0.9 % (FLUSH) INJECTION SYRINGE
2.0000 mL | INJECTION | INTRAMUSCULAR | Status: DC | PRN
Start: 2020-06-17 — End: 2020-06-21
  Administered 2020-06-19: 4 mL

## 2020-06-17 MED ORDER — OXYCODONE-ACETAMINOPHEN 5 MG-325 MG TABLET
1.0000 | ORAL_TABLET | ORAL | Status: AC
Start: 2020-06-17 — End: 2020-06-17
  Administered 2020-06-17: 1 via ORAL
  Filled 2020-06-17: qty 1

## 2020-06-17 MED ORDER — IPRATROPIUM 0.5 MG-ALBUTEROL 3 MG (2.5 MG BASE)/3 ML NEBULIZATION SOLN
INHALATION_SOLUTION | RESPIRATORY_TRACT | Status: AC
Start: 2020-06-17 — End: 2020-06-17
  Filled 2020-06-17: qty 15

## 2020-06-17 MED ORDER — NICOTINE (POLACRILEX) 4 MG GUM
4.0000 mg | CHEWING_GUM | BUCCAL | Status: DC | PRN
Start: 2020-06-17 — End: 2020-06-19
  Filled 2020-06-17: qty 1

## 2020-06-17 MED ORDER — NORTRIPTYLINE 10 MG CAPSULE
10.0000 mg | ORAL_CAPSULE | Freq: Every evening | ORAL | Status: DC
Start: 2020-06-17 — End: 2020-06-21
  Administered 2020-06-17 – 2020-06-20 (×4): 10 mg via ORAL
  Filled 2020-06-17 (×5): qty 1

## 2020-06-17 MED ORDER — IPRATROPIUM 0.5 MG-ALBUTEROL 3 MG (2.5 MG BASE)/3 ML NEBULIZATION SOLN
3.0000 mL | INHALATION_SOLUTION | Freq: Four times a day (QID) | RESPIRATORY_TRACT | Status: DC
Start: 2020-06-17 — End: 2020-06-19
  Administered 2020-06-17 – 2020-06-18 (×4): 3 mL via RESPIRATORY_TRACT
  Administered 2020-06-18: 0 mL via RESPIRATORY_TRACT
  Administered 2020-06-18: 3 mL via RESPIRATORY_TRACT
  Administered 2020-06-19: 0 mL via RESPIRATORY_TRACT

## 2020-06-17 NOTE — Discharge Summary (Signed)
[facility]   DISCHARGE SUMMARY        Patient ID: Kara Barnes, Kara Barnes, 49 y.o. female Date of Admission: 06/13/2020   Date of Birth:  24-Nov-1970    MRN: [id number] Inpatient Admission Date:     PCP: Pcp Not In System Discharge date: 06/17/2020   Attending: Wandra Feinstein, MD  RESIDENT PHYSICIAN: Carlyon Prows, MD Length of Stay: 0       ADMISSION DIAGNOSIS:   Ovarian cyst, bilateral  Active Hospital Problems    Diagnosis Date Noted    S/P bilateral salpingo-oophorectomy [Z90.722] 06/16/2020    Post-operative state [Z98.890] 06/15/2020    Migraine [G43.909]     Thyroid disease [E07.9]     Depression [F32.9]     Anxiety [F41.9]     COPD (chronic obstructive pulmonary disease) (CMS HCC) [J44.9]     Syncopal episodes [R55]     Coronary artery disease [I25.10]     HTN (hypertension) [I10]     Aortic valve disorder [I35.9]     Cervical dysphagia [R13.19] [facility] Problems    Diagnosis     Principle Problem: Ovarian cyst, bilateral [N83.201, N83.202]     Vomiting and diarrhea [R11.10, R19.7]     Difficulty urinating [R39.198]         DISCHARGE DIAGNOSIS:    Active Hospital Problems    Diagnosis Date Noted    S/P bilateral salpingo-oophorectomy [Z90.722] 06/16/2020    Post-operative state [Z98.890] 06/15/2020    Migraine [G43.909]     Thyroid disease [E07.9]     Depression [F32.9]     Anxiety [F41.9]     COPD (chronic obstructive pulmonary disease) (CMS HCC) [J44.9]     Syncopal episodes [R55]     Coronary artery disease [I25.10]     HTN (hypertension) [I10]     Aortic valve disorder [I35.9]     Cervical dysphagia [R13.19] [facility] Problems    Diagnosis     Principle Problem: Ovarian cyst, bilateral [N83.201, N83.202]     Vomiting and diarrhea [R11.10, R19.7]     Difficulty urinating [R39.198]          CONSULTATIONS: Gynecology, GI    PROCEDURES PERFORMED: Operative/Interventional procedures: Bilateral salpingoopherectomy.     REASON FOR ADMISSION: Bilateral ovarian cysts     HOSPITAL  COURSE AND TREATMENT: Kara Barnes is a 49 y.o. female who presented with abdominal pain and found to have large bilateral complex ovarian cysts. Admitted for surgical removal. Throughout stay she had worsening LFTs and subsequent liver u/s indicated hepatomegaly, s/p cholecystectomy with prominent common duct. GI was consulted and recommended MRCP. Patient was accepted for transfer to [facility].      Filed Vitals:    06/16/20 1516 06/16/20 2023 06/16/20 2133 06/17/20 0213   BP: (!) 119/58 128/63  137/60   Pulse: 87 81  66   Resp: 16 18 18 18    Temp: 36.1 C (96.9 F) 36.8 C (98.3 F)  36.1 C (97 F)   SpO2:  99%  93%       PENDING LAB RESULTS: None    CONDITION ON DISCHARGE: Alert, Oriented and VS Stable    DISCHARGE DISPOSITION:  Home discharge  and Transfer to acute care facility.   The patient is being transferred to [facility] due to lact of MRCP.  The benefits of the transfer outweigh the risks, and the patient has consented to the transfer.  The accepting facility  has available space, time, and capacities to perform MRCP.    Dr. Aris Lot has verified the capability at the accepting facility.  The nearest facility for the procedure was discussed with the patient.  Consideration for alternative procedures that may be performed at [facility] was considered. Dr. Kirk Ruths has accepted care for the patient  DISCHARGE MEDICATIONS:     Current Discharge Medication List        CONTINUE these medications - NO CHANGES were made during your visit.        Details   acetaminophen 500 mg Tablet  Commonly known as: TYLENOL   500 mg, Oral, EVERY 4 HOURS PRN  Refills: 0     ALPRAZolam 2 mg Tablet  Commonly known as: XANAX   2 mg, Oral, 3 TIMES DAILY PRN  Refills: 0     escitalopram oxalate 20 mg Tablet  Commonly known as: LEXAPRO   20 mg, Oral, DAILY  Refills: 0     levothyroxine 100 mcg Tablet  Commonly known as: SYNTHROID   300 mcg, Oral, EVERY MORNING  Refills: 0     nortriptyline 10 mg Capsule  Commonly known as:  PAMELOR   10 mg, Oral, NIGHTLY  Refills: 0                DISCHARGE INSTRUCTIONS:   No discharge procedures on file.            SIGNIFICANT RADIOLOGY:   Results for orders placed or performed during the hospital encounter of 06/13/20 (from the past 72 hour(s))   US PELVIS NON OB TRANSABDOMINAL/TRANSVAGINAL     Status: None    Narrative    Kara Barnes  Female, 49 years old.    US PELVIS NON OB TRANSABDOMINAL/TRANSVAGINAL performed on 06/14/2020 9:14  AM.    REASON FOR EXAM:  bilateral ovarian cysts    COMPARISON: None      FINDINGS:  UTERUS: , Uterus measures 8.7 x 3.3 x 4.3 cm and is of unremarkable  echotexture. Endometrium is slightly prominent at 1 cm in thickness.    .     ADNEXA: Right ovary is prominent measures 11 x 6.5 x 7 cm contains a  complex septated cyst measuring 9.4 x 6.6 x 6 cm.    The left ovary is also prominent but less so measuring 6.5 x 4.8 x 5 cm  also containing a septated cyst measuring 6 x 4.5 x 4.7 cm.    No evidence of adnexal masses and ovaries show proper blood flow.    FREE FLUID:  None.         Impression    Both ovaries are enlarged and contain a septated cyst each as described  above. Follow-up is suggested        Radiologist location ID: [id number]     OR SCOPE GYN     Status: None    Narrative    *Procedure not read by radiology.  *Refer to procedure note for result.   US LIVER     Status: None    Narrative    Kara Barnes  Female, 49 years old.    US LIVER performed on 06/16/2020 6:58 AM.    REASON FOR EXAM:  Hepatomegaly, elevated LFTs      TECHNIQUE: EXAMINATION: Right upper quadrant ultrasound performed on  06/16/2020.    INDICATION: Right upper quadrant pain.    COMPARISON: CT of 06/13/2020    FINDINGS: The liver is prominent and shows the  diffuse increased  echogenicity consistent with hepatomegaly/steatosis. There is no evidence  of masses, acute abnormalities or bile duct dilation. The portal vein is of  normal caliber and demonstrates appropriate hepatopedal flow. Common  bile  duct is prominent at 11 mm diameter. This may be in part result of previous  cholecystectomy (compensatory dilatation). No definite intraluminal defects  are found. If clinically relevant the addition of MRCP should be considered  . Status post cholecystectomy. The right kidney demonstrates normal  parenchymal thickness and echogenicity, and shows no evidence of masses,  stones or hydronephrosis. .      Impression    Hepatomegaly and steatosis    Status post cholecystectomy and prominent common bile duct.      COMPARISON:    FINDINGS:    IMPRESSION:        Radiologist location ID: [id number]         SIGNIFICANT LABS:   Results for orders placed or performed during the hospital encounter of 06/13/20 (from the past 24 hour(s))   CBC/DIFF    Narrative    The following orders were created for panel order CBC/DIFF.  Procedure                               Abnormality         Status                     ---------                               -----------         ------                     CBC WITH IPJA[[id number]]                                    Final result                 Please view results for these tests on the individual orders.   COMPREHENSIVE METABOLIC PNL, FASTING   Result Value Ref Range    SODIUM 143 136 - 145 mmol/L    POTASSIUM 3.9 3.5 - 5.1 mmol/L    CHLORIDE 108 96 - 111 mmol/L    CO2 TOTAL 25 22 - 30 mmol/L    ANION GAP 10 4 - 13 mmol/L    BUN 6 (L) 8 - 25 mg/dL    CREATININE 0.53 (L) 0.60 - 1.05 mg/dL    BUN/CREA RATIO 11 6 - 22    ESTIMATED GFR >90 >=60 mL/min/BSA    ALBUMIN 3.4 (L) 3.5 - 5.0 g/dL     CALCIUM 9.8 8.5 - 10.0 mg/dL    GLUCOSE 117 (H) 70 - 99 mg/dL    ALKALINE PHOSPHATASE 203 (H) 40 - 110 U/L    ALT (SGPT) 139 (H) 8 - 22 U/L    AST (SGOT)  75 (H) 8 - 45 U/L    BILIRUBIN TOTAL 0.4 0.3 - 1.3 mg/dL    PROTEIN TOTAL 6.2 (L) 6.4 - 8.3 g/dL   CBC WITH DIFF   Result Value Ref Range    WBC 9.0 3.7 - 11.0 x10^3/uL    RBC 4.42 3.85 - 5.22 x10^6/uL    HGB 12.6 11.5 - 16.0 g/dL  HCT 38.2 34.8 -  46.0 %    MCV 86.4 78.0 - 100.0 fL    MCH 28.5 26.0 - 32.0 pg    MCHC 33.0 31.0 - 35.5 g/dL    RDW-CV 12.0 11.5 - 15.5 %    PLATELETS 206 150 - 400 x10^3/uL    MPV 9.8 8.7 - 12.5 fL    NEUTROPHIL % 83 %    LYMPHOCYTE % 13 %    MONOCYTE % 3 %    EOSINOPHIL % 0 %    BASOPHIL % 0 %    NEUTROPHIL # 7.53 1.50 - 7.70 x10^3/uL    LYMPHOCYTE # 1.13 1.00 - 4.80 x10^3/uL    MONOCYTE # 0.29 0.20 - 1.10 x10^3/uL    EOSINOPHIL # <0.10 <=0.50 x10^3/uL    BASOPHIL # <0.10 <=0.20 x10^3/uL    IMMATURE GRANULOCYTE % 1 0 - 1 %    IMMATURE GRANULOCYTE # <0.10 <0.10 x10^3/uL   THYROID STIMULATING HORMONE WITH FREE T4 REFLEX   Result Value Ref Range    TSH <0.008 (L) 0.430 - 3.550 uIU/mL   THYROXINE, FREE (FREE T4)   Result Value Ref Range    THYROXINE (T4), FREE 1.99 (H) 0.70 - 1.25 ng/dL   CBC/DIFF    Narrative    The following orders were created for panel order CBC/DIFF.  Procedure                               Abnormality         Status                     ---------                               -----------         ------                     CBC WITH IDPO[[id number]]                                                                 Please view results for these tests on the individual orders.       ALLERGIES:   Allergies   Allergen Reactions    Penicillins Rash    Ultram [Tramadol] Rash    Flagyl [Metronidazole]  Other Adverse Reaction (Add comment)     Migraine       Flexeril [Cyclobenzaprine]  Other Adverse Reaction (Add comment)     Migraine      Toradol [Ketorolac]  Other Adverse Reaction (Add comment)     Migraine         CODE STATUS:   Code Status Information       Code Status Advance Care Planning    Full Code Jump to the Activity                   Evaluated patient and discussed treatment plan with attending physician, Dr. Jodi Mourning, MD 06/17/2020, 02:40  [place]      Primus Bravo, MD  06/17/2020, 16:00

## 2020-06-17 NOTE — Progress Notes (Signed)
INTERNAL MEDICINE  Progress Note     Name: Kara Barnes, Post[place], 49 y.o. female Date of Service: 06/17/2020   ZJQ:[id number] LOS: 0   PCP:Pcp Not In System Attending: Zollie Scale, MD   Code Status: Full Code Admitted for: Dilation of common bile duct     SUBJECTIVE:      Lying in bed sleeping, however is easily awakened.  States that if she is to be discharged, she will need assistance getting back to trial style [place] as this is where things are.  She does have out of state Medicaid.  No acute complaints this morning     OBJECTIVE:    EXAMINATION:  Temperature: 36.2 C (97.2 F) BP (Non-Invasive): 134/62 Heart Rate: 75   Respiratory Rate: 15 SpO2: 90 % (rn notified) Weight: 64.7 kg (142 lb 10.2 oz)        No intake or output data in the 24 hours ending 06/17/20 1805  Last Bowel Movement: 06/17/20     General: Well-nourished, well-developed, in NAD.  HEENT: NC/AT, EOMI, Oropharynx clear, mucous membranes moist, Trachea midline.  Cardiovascular: RRR, no murmurs, rubs, gallops.  Respiratory: CTABL  Abdominal: Bowel sounds normal; abdomen soft, non-tender to palpation, including deep palpation  Extremities/Skin: No peripheral edema. Warm and dry.  Neurological: Awake, A&O x 3.     LABS:    CBC Differential   Recent Labs     06/16/20  0739 06/17/20  0612   WBC 9.0 8.6   HGB 12.6 12.2   HCT 38.2 41.3   PLTCNT 206 203    Recent Labs     06/16/20  0739 06/17/20  0612   PMNS 83 56   MONOCYTES 3 5   BASOPHILS 0   <0.10 0   <0.10   PMNABS 7.53 4.73   LYMPHSABS 1.13 3.25   MONOSABS 0.29 0.45   EOSABS <0.10 <0.10      BMP LFTs   Recent Labs     06/17/20  0611   SODIUM 143   POTASSIUM 3.5   CHLORIDE 110   CO2 26   BUN 14   CREATININE 0.52*   GLUCOSENF 96   ANIONGAP 7   BUNCRRATIO 27*   GFR >90   CALCIUM 9.2   ALBUMIN 3.3*    Recent Labs     06/17/20  0611   TOTALPROTEIN 6.0*   ALBUMIN 3.3*   TOTBILIRUBIN 0.3   AST 27   ALT 84*   ALKPHOS 179*      CoAgs Blood Gas:   No results found for this encounter No results found  for this encounter    Cardiac Markers Lipid Panel   No results for input(s): TROPONINI, CKMB, MBINDEX, BNP in the last 72 hours. Recent Labs     06/17/20  0611   CHOLESTEROL 160   HDLCHOL 39*   LDLCHOL 74   TRIG 235*   VLDLCAL 47*        IMAGING STUDIES:         PATHOLOGY & MICROBIOLOGY:    No results found for any visits on 06/17/20 (from the past 24 hour(s)).    INPATIENT MEDICATIONS:  acetaminophen (TYLENOL) tablet, 650 mg, Oral, Q6H PRN  albuterol (PROVENTIL) 2.5 mg / 3 mL (0.083%) neb solution, 2.5 mg, Nebulization, Q2H PRN  ALPRAZolam (XANAX) tablet, 2 mg, Oral, 3x/day  enoxaparin PF (LOVENOX) 40 mg/0.4 mL SubQ injection, 40 mg, Subcutaneous, Q24H  [START ON 06/18/2020] escitalopram (LEXAPRO) tablet, 20 mg, Oral, Daily  ipratropium-albuterol  0.5 mg-3 mg(2.5 mg base)/3 mL Solution for Nebulization, 3 mL, Nebulization, 4x/day  [START ON 06/18/2020] levothyroxine (SYNTHROID) tablet, 200 mcg, Oral, QAM  [START ON 06/18/2020] nicotine (NICODERM CQ) transdermal patch (mg/24 hr), 42 mg, Transdermal, Daily  nicotine polacrilex (COMMIT) lozenge, 2 mg, Oral, Q1H PRN  nicotine polacrilex (NICORETTE) chewing gum, 4 mg, Oral, Q1H PRN  nortriptyline (PAMELOR) capsule, 10 mg, Oral, NIGHTLY  NS flush syringe, 2-6 mL, Intracatheter, Q8HRS   And  NS flush syringe, 2-6 mL, Intracatheter, Q1 MIN PRN  polyethylene glycol (MIRALAX) oral packet, 17 g, Oral, Daily  [START ON 06/18/2020] sennosides-docusate sodium (SENOKOT-S) 8.6-50mg  per tablet, 1 Tablet, Oral, 2x/day         ASSESSMENT:     Kara Barnes is a 49 y.o. female with PMH of multiple mood disorders, COPD, CVA, HTN, and thyroid disease, who presented to outside hospital  With back pain. She was admitted for further work up of CBD dilation.     Active Hospital Problems    Diagnosis    Primary Problem: Dilation of common bile duct        PLAN:   CBD dilation   s/p CCY  Shown on RUQ u/s (27mm) which recommended MRCP   Tolerating PO intake at outside hospital.   MRCP  WNL,  gastroenterology consulted given she was transferred per their recommendations.     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     - Comorbid Medical Conditions -   Anxiety - Xanax 2 mg TID  Migraines - Continue Nortriptyline 25 mg qhs  Depression - Continue home Lexapro 20 mg  Iatrogenic Hypothyroidism - TSH undetectable, decreased to 200 mcg at outside hospital, will continue this dose (decreased from 300 mcg per patient)   Tobacco Use Disorder  - Nicotine Replacement ordered     ___    DVT/PE Prophylaxis - Enoxaparin  Consults - none  Hardware (Lines, Drains, Foley, Tubes) - PIV  Diet - Regular  Activity - Ambulate w/ assitance  Therapy - None    ANTICIPATED DISPOSITION PLANNING   Needs -  Needs assistance with Transportation to [facility] on D/C. CM contacted and aware 8/14  Location - Home ,    Johnney Killian M.D. - 06/17/2020  [facility]  Internal Medicine, PGY-3      I saw and examined the patient.  I reviewed the resident's note.  I agree with the findings and plan of care as documented in the resident's note.  Any exceptions/additions are edited/noted.    Zollie Scale, MD

## 2020-06-17 NOTE — H&P (Signed)
[facility]  INTERNAL MEDICINE  History & Physical     Name: Kara Barnes, Kara Barnes, 49 y.o. female Date of Service:  06/17/2020   Date of Birth:  January 15, 1971 Date of Admission:  06/17/2020   PCP: Pcp Not In System Attending: Zollie Scale, MD   Room: 707/A      Code Status: Full Code Chief Complaint: had no chief complaint listed for this encounter.  Admitted for: Dilation of common bile duct     HISTORY OF PRESENTING ILLNESS:      Kara Barnes is a 49 y.o. female with a complicated PMH of anxiety, depression, PTSD, COPD, self-reported Graves Disease s/p RAIU, possible valvular disease, and previous strokes who presented to [facility] on 06/17/2020 as a transfer from [facility] because of worsening LFTs and subsequent liver u/s indicating hepatomegaly with outside facility GI recommending MRCP.    Patient was admitted to outside facility for abdominal pain and found to have large bilateral complex ovarian cysts and underwent bilateral laparoscopic oophorectomy on 8/11. Pathology showed benign serous cystadenoma. She still reports abdominal pain and chills but denied fevers or bloody bowel movements.    Of note patient has long psych history and follows [facility] in [place]. She has experienced recent homelessness and has been caring for an aunt and uncle in [place] since April after leaving [place]. She is hoping to return for follow-up but does not have a clear plan to do so at this time.       MEDICAL HISTORY:    PAST MEDICAL & SURGICAL HISTORIES:   Past Medical History:   Diagnosis Date    Anxiety     Aortic valve disorder     Arthropathy     Cervical dysphagia 1995    COPD (chronic obstructive pulmonary disease) (CMS HCC)     Coronary artery disease     CVA (cerebrovascular accident) (CMS HCC)     Depression     HTN (hypertension)     Migraine     PTSD (post-traumatic stress disorder)     Syncopal episodes     Thyroid disease     Past Surgical History:   Procedure  Laterality Date    BILATERAL SALPINGOOPHORECTOMY      DENTAL SURGERY      HX CESAREAN SECTION      HX CHOLECYSTECTOMY      SINUS SURGERY          HOME MEDICATIONS:  Outpatient Medications Marked as Taking for the 06/17/20 encounter [facility] Encounter)   Medication Sig    acetaminophen (TYLENOL) 500 mg Oral Tablet Take 500 mg by mouth Every 4 hours as needed for Pain    ALPRAZolam (XANAX) 2 mg Oral Tablet Take 2 mg by mouth Three times a day as needed     escitalopram oxalate (LEXAPRO) 20 mg Oral Tablet Take 20 mg by mouth Once a day    levothyroxine (SYNTHROID) 100 mcg Oral Tablet Take 2 Tablets (200 mcg total) by mouth Every morning    nortriptyline (PAMELOR) 10 mg Oral Capsule Take 10 mg by mouth Every night        ALLERGIES:  She is allergic to penicillins, ultram [tramadol], flagyl [metronidazole], flexeril [cyclobenzaprine], and toradol [ketorolac].     FAMILY HISTORY:  Her family history includes Cancer in her mother.    SOCIAL HISTORY:  She  reports previous alcohol use. She  reports that she has been smoking. She has been smoking about 1.00 pack per day.  She has never used smokeless tobacco. She  reports current drug use. Drug: Marijuana.    REVIEW OF SYSTEMS:    Constitutional: +Chills. Negative for fevers, sweats, fatigue, malaise, weight loss.  Eyes: Negative for visual disturbance, redness.  Ears, nose, mouth, throat, and face: Negative for hearing loss, earaches, nasal congestion, sore throat.  Respiratory: +Cough. Negative for sputum, hemoptysis, dyspnea on exertion.  Cardiovascular: Negative for chest pain, palpitations, lower extremity edema.  Gastrointestinal: +Nausea and abdominal pain.  Genitourinary: Negative for dysuria, hematuria.  Integument/breast: Negative for rash, pruritus.  Hematologic/lymphatic: Negative for easy bruising.  Musculoskeletal: Negative for myalgias, arthralgias.  Neurological: +Headaches, reported right CN deficits of 3,7,11. Negative for dizziness,  weakness.  Behavioral/Psych: +Anxiety, depression, PTSD, sleep disturbance.  Endocrine: Negative for polyuria, polydipsia, temperature intolerance.  Allergic/Immunologic: Negative for urticaria, anaphylaxis.    EXAMINATION:        BP (Non-Invasive): 134/62 Heart Rate: 75   Respiratory Rate: 16 SpO2: 90 %       General: Well-nourished, well-developed, in no apparent distress.   Head: Normocephalic, atraumatic  Eyes: EOMI, sclarea non-icteric, conjunctivae clear.  Throat: Oropharynx clear, mucous membranes moist.  Neck: Trachea midline, no thyromegaly or lymphadenopathy.   Cardiovascular: RRR, no murmurs, rubs, gallops.  Respiratory: Wheezing.  GI: Bowel sounds normal; abdomen soft, tender to palpation.  Extremities: No edema. Dorsalis pedis pulses 2+ bilaterally.   Skin: Warm and dry.  Neurological: Awake, A&O x 3. CNII-XII, strength and sensation grossly intact. Maybe some weakness on CN7 on the right side.  Psychiatric: Labile mood and affect. Thought process tangential. Speech content normal    LABS   IMAGING   MICRO:      CBC Differential   Recent Labs     06/16/20  0739 06/17/20  0612   WBC 9.0 8.6   HGB 12.6 12.2   HCT 38.2 41.3   PLTCNT 206 203    Recent Labs     06/16/20  0739 06/17/20  0612   PMNS 83 56   MONOCYTES 3 5   BASOPHILS 0   <0.10 0   <0.10   PMNABS 7.53 4.73   LYMPHSABS 1.13 3.25   MONOSABS 0.29 0.45   EOSABS <0.10 <0.10      BMP LFTs   Recent Labs     06/17/20  0611   SODIUM 143   POTASSIUM 3.5   CHLORIDE 110   CO2 26   BUN 14   CREATININE 0.52*   GLUCOSENF 96   ANIONGAP 7   BUNCRRATIO 27*   GFR >90   CALCIUM 9.2   ALBUMIN 3.3*    Recent Labs     06/17/20  0611   TOTALPROTEIN 6.0*   ALBUMIN 3.3*   TOTBILIRUBIN 0.3   AST 27   ALT 84*   ALKPHOS 179*      CoAgs Blood Gas:   No results found for this encounter No results found for this encounter    Cardiac Markers Lipid Panel   No results for input(s): TROPONINI, CKMB, MBINDEX, BNP in the last 72 hours. Recent Labs     06/17/20  0611   CHOLESTEROL  160   HDLCHOL 39*   LDLCHOL 74   TRIG 235*   VLDLCAL 47*      Urine Analysis Other Labs   No results found for this encounter No results found for this encounter    Invalid input(s): PRL           No results found  for any visits on 06/17/20 (from the past 24 hour(s)).    ASSESSMENT:      Kara Barnes is a 49 y.o. female with PMH of anxiety, depression, PTSD, COPD, self-reported Graves Disease s/p RAIU, possible valvular disease, and previous strokes who presented to [facility] on 06/17/2020 as a transfer from [facility] because of worsening LFTs and subsequent liver u/s indicating hepatomegaly with outside facility GI recommending MRCP.    Active Hospital Problems    Diagnosis    Primary Problem: Dilation of common bile duct        PLAN:    Hepatomegaly and Transaminitis  -MRCP evaluation of liver u/s showing hepatomegaly and steatosis s/p cholecystectomy and prominent common bile duct  -Monitor HFPs  -Tylenol Q6hr PRN for pain control    - Chronic Medical Conditions Managed During This Admission -   Graves Disease s/p RAIU - Continue Synthroid at 267mcg.  Anxiety - Xanax 2mg  TID, Lexapro 20mg  daily.  COPD - Albuterol PRN (said she was supposed to be on several at home but did not know which and has not taken them for some time). Respiratory to evaluate.          ___    DVT/PE Prophylaxis - Enoxaparin  Consults - none  Hardware (Lines, Drains, Foley, Tubes) - PIV  Diet - Regular  Activity - Up w/ assistance and Up in chair TID w/ all meals  Therapy - None  Disposition Planning - TBD    Evonnie Pat, Medical Student MS4 - 06/17/2020  [facility]      Late entry for 06/17/20. I saw and examined the patient.  I reviewed the resident's note.  I agree with the findings and plan of care as documented in the resident's note.  Any exceptions/additions are edited/noted.    Zollie Scale, MD

## 2020-06-17 NOTE — Respiratory Therapy (Signed)
Respiratory Evaluation   Patient is resting comfortably on RA showing no signs of respiratory distress at this time. BBS are diminished and tight throughout. She did well with her breathing treatment. We will continue to monitor.

## 2020-06-17 NOTE — Nurses Notes (Addendum)
Pt transferred to [facility] via [facility]. Report given to ambulance personnel. Belonging sent w/pt. Medicated w/percocet 5/325mg  1 tab po prior to transfer.

## 2020-06-17 NOTE — Ancillary Notes (Signed)
Patient has been assigned to Medicine 1 service at this time. Please call that team with any questions.     Ubaldo Glassing, DO  06/17/2020, 15:17

## 2020-06-17 NOTE — Care Management Notes (Signed)
06/17/20 0900   Social Work Plan   Discharge Planning Status discharge plan complete   Projected Discharge Date 06/17/20   Anticipated Discharge Disposition Other [place] called [facility] to arrange transport for patient to [place] room 707.   Per Shirlean Mylar, crew currently on a call but ETA to [facility] would be 0930. Notified Roxanne, primary RN.

## 2020-06-17 NOTE — H&P (Signed)
INTERNAL MEDICINE  History & Physical     Name: Kara Barnes, Kara Barnes, 49 y.o. female Date of Service:  06/17/2020   Date of Birth:  [birth year] Date of Admission:  06/17/2020   PCP: Pcp Not In System Attending: Zollie Scale, MD   Room: 707/A      Code Status: Prior Chief Complaint: had no chief complaint listed for this encounter.  Admitted for: <principal problem not specified>     HISTORY OF PRESENTING ILLNESS:      Kara Barnes is a 49 y.o. female who presented to [facility] on 06/17/2020 from outside hospital with abdominal pain and imagining findings concerning for CBD dilation.     She initially presented to the outside hospital c/o RUQ pain which radiated to her LUQ. It was associated with nausea and non-bloody emesis and diarrhea. CT showed B/L large adnexal masses. Her LMP was 5 years ago, hx bilateral salping-oophorectomy, c-section, and cervical resection for grade III dysplasia. She underwent B/L ovarian resection 06/15/20 which revealed serous cystadenoma bilaterally. RUQ U/S which was ordered for mild transaminitis revealed mild CBD dilation at 11 mm, given transaminitis and abdominal pain, decision was made to transfer patient to [facility] for MRCP.          MEDICAL HISTORY:    PAST MEDICAL & SURGICAL HISTORIES:   Past Medical History:   Diagnosis Date    Anxiety     Aortic valve disorder     Arthropathy     Cervical dysphagia 1995    COPD (chronic obstructive pulmonary disease) (CMS HCC)     Coronary artery disease     CVA (cerebrovascular accident) (CMS HCC)     Depression     HTN (hypertension)     Migraine     PTSD (post-traumatic stress disorder)     Syncopal episodes     Thyroid disease     Past Surgical History:   Procedure Laterality Date    BILATERAL SALPINGOOPHORECTOMY      DENTAL SURGERY      HX CESAREAN SECTION      HX CHOLECYSTECTOMY      SINUS SURGERY          HOME MEDICATIONS:  Outpatient Medications Marked as Taking for the 06/17/20 encounter [facility] Encounter)   Medication Sig     acetaminophen (TYLENOL) 500 mg Oral Tablet Take 500 mg by mouth Every 4 hours as needed for Pain    ALPRAZolam (XANAX) 2 mg Oral Tablet Take 2 mg by mouth Three times a day as needed     escitalopram oxalate (LEXAPRO) 20 mg Oral Tablet Take 20 mg by mouth Once a day    levothyroxine (SYNTHROID) 100 mcg Oral Tablet Take 2 Tablets (200 mcg total) by mouth Every morning    nortriptyline (PAMELOR) 10 mg Oral Capsule Take 10 mg by mouth Every night        ALLERGIES:  She is allergic to penicillins, ultram [tramadol], flagyl [metronidazole], flexeril [cyclobenzaprine], and toradol [ketorolac].     FAMILY HISTORY:  Her family history includes Cancer in her mother.    SOCIAL HISTORY:  She  reports previous alcohol use. She  reports that she has been smoking. She has been smoking about 1.00 pack per day. She has never used smokeless tobacco. She  reports current drug use. Drug: Marijuana.    REVIEW OF SYSTEMS:    Constitutional: (+) chills, Negative for fevers, chills, sweats, fatigue, malaise, weight loss.  Eyes: Negative for visual disturbance, redness.  Ears, nose, mouth,  throat, and face: Negative for hearing loss, earaches, nasal congestion, sore throat.  Respiratory: Negative for cough, sputum, hemoptysis, dyspnea on exertion.  Cardiovascular: Negative for chest pain, palpitations, lower extremity edema.  Gastrointestinal: See HPI  Genitourinary: Negative for dysuria, hematuria.  Integument/breast: Negative for rash, pruritus.  Hematologic/lymphatic: Negative for easy bruising, lymphadenopathy.  Musculoskeletal: Negative for myalgias, arthralgias.  Neurological: Negative for headaches, dizziness, weakness.  Endocrine: Negative for polyuria, polydipsia, temperature intolerance.    EXAMINATION:        BP (Non-Invasive): 134/62 Heart Rate: 75   Respiratory Rate: 16 SpO2: 90 %       General: Well-nourished, well-developed, in no apparent distress. Lying calmly in bed, talking in complete sentences, able to move  around for exam  Head: Normocephalic, atraumatic  Eyes: PERRL, EOMI, sclareae non-icteric, conjunctivae clear.  Throat: Oropharynx clear, mucous membranes moist.  Cardiovascular: RRR, no murmurs, rubs, gallops.  Respiratory: (+) wheezing diffusely   Abdominal: Bowel sounds normal; abdomen soft, abdomen non- tender on distracted exam. Ecchymosis noted at umbilicus and bilateral groin incision sites- all appear to be healing well.  Tender diffusely otherwise  Extremities: No peripheral edema. Dorsalis pedis pulses 2+ bilaterally.   Skin: Warm and dry.  Neurological: Awake, A&O x 3. CNII-XII (patient states CN III, VII, and XI deficits, not noted on exam today), strength and sensation grossly intact.  Psychiatric: Normal mood & odd affect. Thought process labile . Speech content labile    LABS   IMAGING   MICRO:      CBC Differential   Recent Labs     06/16/20  0739 06/17/20  0612   WBC 9.0 8.6   HGB 12.6 12.2   HCT 38.2 41.3   PLTCNT 206 203    Recent Labs     06/16/20  0739 06/17/20  0612   PMNS 83 56   MONOCYTES 3 5   BASOPHILS 0   <0.10 0   <0.10   PMNABS 7.53 4.73   LYMPHSABS 1.13 3.25   MONOSABS 0.29 0.45   EOSABS <0.10 <0.10      BMP LFTs   Recent Labs     06/17/20  0611   SODIUM 143   POTASSIUM 3.5   CHLORIDE 110   CO2 26   BUN 14   CREATININE 0.52*   GLUCOSENF 96   ANIONGAP 7   BUNCRRATIO 27*   GFR >90   CALCIUM 9.2   ALBUMIN 3.3*    Recent Labs     06/17/20  0611   TOTALPROTEIN 6.0*   ALBUMIN 3.3*   TOTBILIRUBIN 0.3   AST 27   ALT 84*   ALKPHOS 179*      CoAgs Blood Gas:   No results found for this encounter No results found for this encounter    Cardiac Markers Lipid Panel   No results for input(s): TROPONINI, CKMB, MBINDEX, BNP in the last 72 hours. Recent Labs     06/17/20  0611   CHOLESTEROL 160   HDLCHOL 39*   LDLCHOL 74   TRIG 235*   VLDLCAL 47*      Urine Analysis Other Labs   No results found for this encounter No results found for this encounter    Invalid input(s): PRL           No results found for  any visits on 06/17/20 (from the past 24 hour(s)).    ASSESSMENT:      Dione Petron is a 49 y.o. female with  PMH of multiple mood disorders, COPD, CVA, HTN, and thyroid disease, who presented to outside hospital  With back pain. She was admitted for further work up of CBD dilation.     Active Hospital Problems    Diagnosis    Dilation of common bile duct        PLAN:    CBD dilation   s/p CCY  Shown on RUQ u/s (55mm) which recommended MRCP   Tolerating PO intake at outside hospital.   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     - Comorbid Medical Conditions -   Anxiety - Xanax 2 mg TID  Migraines - Continue Nortriptyline 25 mg qhs  Depression - Continue home Lexapro 20 mg  Iatrogenic Hypothyroidism - TSH undetectable, decreased to 200 mcg at outside hospital, will continue this dose (decreased from 300 mcg per patient)   Tobacco Use Disorder  - Nicotine Replacement ordered          ___    DVT/PE Prophylaxis - Enoxaparin  Consults - none  Hardware (Lines, Drains, Foley, Tubes) - PIV  Diet - Regular  Activity - Up in chair TID w/ all meals  Therapy - PT/OT  Disposition Planning - TBD; Likely Home discharge     Johnney Killian, MD - 06/17/2020  Internal Medicine PGY-3  [facility]        Late entry for 06/17/20. I saw and examined the patient.  I reviewed the resident's note.  I agree with the findings and plan of care as documented in the resident's note.  Any exceptions/additions are edited/noted.    Zollie Scale, MD

## 2020-06-17 NOTE — Nurses Notes (Signed)
Report called to Vinnie Level, accepting nurse at [facility],  [phone].

## 2020-06-17 NOTE — Nurses Notes (Signed)
Pt arrived to unit at 2030, pt ambulating in room with steady gait upon this RN entering the room, pt states she has 8/10 pain and needs "everything I have available as far a medicines", pt was medicated per orders, pt requested tea, tea provided, vss, assessment benign other than abdominal incisions, hourly rounding completed overnight, no acute adverse events occurred, bed in lowest position, call light within reach, bssr given to Roxi RN, nursing care relinquished at this time.

## 2020-06-17 NOTE — Nurses Notes (Signed)
Pt requesting for percocet, says she gets 5mg  prn, she just has tylenol on her med list and it's not due til another 2 hrs but she says she needs something stronger than tylenol. Thanks! - text page to [facility]

## 2020-06-17 NOTE — Care Management Notes (Signed)
CM noted patient for tx to [facility] for higher LOC and awaiting transport

## 2020-06-18 ENCOUNTER — Observation Stay (HOSPITAL_COMMUNITY): Payer: Medicaid - Out of State

## 2020-06-18 ENCOUNTER — Other Ambulatory Visit: Payer: Self-pay

## 2020-06-18 DIAGNOSIS — R748 Abnormal levels of other serum enzymes: Secondary | ICD-10-CM

## 2020-06-18 DIAGNOSIS — Z9049 Acquired absence of other specified parts of digestive tract: Secondary | ICD-10-CM

## 2020-06-18 DIAGNOSIS — R109 Unspecified abdominal pain: Secondary | ICD-10-CM

## 2020-06-18 LAB — CBC WITH DIFF
BASOPHIL #: <0.1 10*3/uL (ref <=0.20)
BASOPHIL %: 0 %
EOSINOPHIL #: <0.1 10*3/uL (ref <=0.50)
EOSINOPHIL %: 1 %
HCT: 37.8 % (ref 34.8–46.0)
HGB: 12.3 g/dL (ref 11.5–16.0)
IMMATURE GRANULOCYTE #: 0.1 10*3/uL — ABNORMAL HIGH (ref <0.10)
IMMATURE GRANULOCYTE %: 1 % (ref 0–1)
LYMPHOCYTE #: 3.41 10*3/uL (ref 1.00–4.80)
LYMPHOCYTE %: 47 %
MCH: 28.5 pg (ref 26.0–32.0)
MCHC: 32.5 g/dL (ref 31.0–35.5)
MCV: 87.5 fL (ref 78.0–100.0)
MONOCYTE #: 0.37 10*3/uL (ref 0.20–1.10)
MONOCYTE %: 5 %
MPV: 10.4 fL (ref 8.7–12.5)
NEUTROPHIL #: 3.34 10*3/uL (ref 1.50–7.70)
NEUTROPHIL %: 46 %
PLATELETS: 199 10*3/uL (ref 150–400)
RBC: 4.32 10*6/uL (ref 3.85–5.22)
RDW-CV: 12.8 % (ref 11.5–15.5)
WBC: 7.3 10*3/uL (ref 3.7–11.0)

## 2020-06-18 LAB — DRUG SCREEN, WITH CONFIRMATION, URINE
AMPHETAMINES, URINE: NEGATIVE
BARBITURATES URINE: NEGATIVE
BENZODIAZEPINES URINE: POSITIVE — AB
BUPRENORPHINE URINE: NEGATIVE
CANNABINOIDS URINE: NEGATIVE
COCAINE METABOLITES URINE: NEGATIVE
CREATININE RANDOM URINE: 22 mg/dL — ABNORMAL LOW (ref 50–100)
ECSTASY/MDMA URINE: NEGATIVE
FENTANYL, RANDOM URINE: NEGATIVE
METHADONE URINE: NEGATIVE
OPIATES URINE (LOW CUTOFF): NEGATIVE
OXYCODONE URINE: NEGATIVE

## 2020-06-18 LAB — BASIC METABOLIC PANEL
ANION GAP: 9 mmol/L (ref 4–13)
BUN/CREA RATIO: 18 (ref 6–22)
BUN: 11 mg/dL (ref 8–25)
CALCIUM: 9.3 mg/dL (ref 8.5–10.0)
CHLORIDE: 106 mmol/L (ref 96–111)
CO2 TOTAL: 26 mmol/L (ref 22–30)
CREATININE: 0.6 mg/dL (ref 0.60–1.05)
ESTIMATED GFR: >90 mL/min/BSA (ref >=60)
GLUCOSE: 88 mg/dL (ref 65–125)
POTASSIUM: 3.8 mmol/L (ref 3.5–5.1)
SODIUM: 141 mmol/L (ref 136–145)

## 2020-06-18 LAB — HEPATIC FUNCTION PANEL
ALBUMIN: 3.4 g/dL — ABNORMAL LOW (ref 3.5–5.0)
ALKALINE PHOSPHATASE: 159 U/L — ABNORMAL HIGH (ref 40–110)
ALT (SGPT): 64 U/L — ABNORMAL HIGH (ref 8–22)
AST (SGOT): 18 U/L (ref 8–45)
BILIRUBIN DIRECT: 0.1 mg/dL (ref 0.1–0.4)
BILIRUBIN TOTAL: 0.4 mg/dL (ref 0.3–1.3)
PROTEIN TOTAL: 6.2 g/dL — ABNORMAL LOW (ref 6.4–8.3)

## 2020-06-18 LAB — THYROXINE, FREE (FREE T4): THYROXINE (T4), FREE: 2.25 ng/dL — ABNORMAL HIGH (ref 0.70–1.25)

## 2020-06-18 LAB — MAGNESIUM: MAGNESIUM: 1.8 mg/dL (ref 1.8–2.6)

## 2020-06-18 MED ORDER — ACETAMINOPHEN 325 MG TABLET
650.0000 mg | ORAL_TABLET | ORAL | Status: DC | PRN
Start: 2020-06-18 — End: 2020-06-21
  Administered 2020-06-18 – 2020-06-20 (×11): 650 mg via ORAL
  Filled 2020-06-18 (×11): qty 2

## 2020-06-18 NOTE — Consults (Signed)
[place]      Kara Barnes, Kara Barnes, 49 y.o. female  Encounter Start Date:  06/17/2020  Inpatient Admission Date:  06/17/2020  Date of service: 06/18/2020  Date of Birth:  1971-09-12    Hospital Day:  LOS: 1 day     Service: Medicine   Requesting MD: Dr Karl Pock    Information Obtained from: patient  Chief Complaint:  Abdominal pain    Assessment/Recommendations:   Kara Barnes is a 49 y.o., White female with past medical history of hypothyroidism, anxiety who presents with symptoms of abdominal pain. She has had bilateral ovarian resection on 06/15/2020 at [facility] which revealed serous cystadenoma bilaterally.     1. Abdominal pain concerning for cholelithiasis versus choledocholithiasis  - labs concerning for WBC 7.3, total bilirubin 0.4, conjugated bilirubin 0.1, AST 18, ALT 64, ALP 159  - At [facility] hospital, patient had MRCP which showed Mild prominence of the intrahepatic bile ducts and mild enlargement of the extrahepatic bile duct measuring up to 9 mm in diameter. No filling defects or obstructing lesion identified on this noncontrast examination.   - patient has history of cholecystectomy.  - Will plan for EUS on 06/20/20   - Please keep NPO past midnight the day before procedure  - Monitor LFT's, CBC and BMP      HPI/Discussion:  Kara Barnes is a 49 y.o., White female with past medical history of hypothyroidism, anxiety who presents with symptoms of abdominal pain.    Patient stated that she was recently at an outside hospital for bilateral flank pain and back pain.  She was found to have ovarian cysts.  She has had bilateral ovarian resection on 06/15/2020 at [facility] which revealed serous cystadenoma bilaterally.  RUQ U/S at outside hospital which was ordered for mild transaminitis revealed mild CBD dilation at 11 mm, given transaminitis and abdominal pain, decision was made to transfer patient to [facility] for MRCP.     Patient currently complains of  flank pain bilaterally.  She denies abdominal pain in the epigastric and right upper quadrant region currently.  She denies any nausea, fevers, chills, jaundice.     At [facility] hospital, patient had MRCP which showed Mild prominence of the intrahepatic bile ducts and mild enlargement of the extrahepatic bile duct measuring up to 9 mm in diameter. The distal  common bile duct tapers to measure 4 mm at the level of the pancreatic head. No filling defects or obstructing lesion identified on this noncontrast examination. These findings are likely to correlate of expected physiologic change postcholecystectomy.    Past surgical history includes cholecystectomy, recent bilateral ovarian resection, C-section.    Patient smokes 1 pack a day.  She denies alcohol use.  She smokes marijuana occasionally.  She denies family history of colon cancer.  Patient is not on any antiplatelet or anticoagulation medication.        Past Medical History:   Diagnosis Date    Anxiety     Aortic valve disorder     Arthropathy     Cervical dysphagia 1995    COPD (chronic obstructive pulmonary disease) (CMS HCC)     Coronary artery disease     CVA (cerebrovascular accident) (CMS HCC)     Depression     HTN (hypertension)     Migraine     PTSD (post-traumatic stress disorder)     Syncopal episodes     Thyroid disease  Past Surgical History:   Procedure Laterality Date    BILATERAL SALPINGOOPHORECTOMY      DENTAL SURGERY      HX CESAREAN SECTION      HX CHOLECYSTECTOMY      SINUS SURGERY           Medications Prior to Admission       Prescriptions    acetaminophen (TYLENOL) 500 mg Oral Tablet    Take 500 mg by mouth Every 4 hours as needed for Pain    ALPRAZolam (XANAX) 2 mg Oral Tablet    Take 2 mg by mouth Three times a day as needed     escitalopram oxalate (LEXAPRO) 20 mg Oral Tablet    Take 20 mg by mouth Once a day    levothyroxine (SYNTHROID) 100 mcg Oral Tablet    Take 2 Tablets (200 mcg total) by mouth Every morning    nortriptyline  (PAMELOR) 10 mg Oral Capsule    Take 10 mg by mouth Every night          acetaminophen (TYLENOL) tablet, 650 mg, Oral, Q4H PRN  albuterol (PROVENTIL) 2.5 mg / 3 mL (0.083%) neb solution, 2.5 mg, Nebulization, Q2H PRN  ALPRAZolam (XANAX) tablet, 2 mg, Oral, 3x/day  enoxaparin PF (LOVENOX) 40 mg/0.4 mL SubQ injection, 40 mg, Subcutaneous, Q24H  escitalopram (LEXAPRO) tablet, 20 mg, Oral, Daily  ipratropium-albuterol 0.5 mg-3 mg(2.5 mg base)/3 mL Solution for Nebulization, 3 mL, Nebulization, 4x/day  nicotine (NICODERM CQ) transdermal patch (mg/24 hr), 42 mg, Transdermal, Daily  nicotine polacrilex (COMMIT) lozenge, 2 mg, Oral, Q1H PRN  nicotine polacrilex (NICORETTE) chewing gum, 4 mg, Oral, Q1H PRN  nortriptyline (PAMELOR) capsule, 10 mg, Oral, NIGHTLY  NS flush syringe, 2-6 mL, Intracatheter, Q8HRS   And  NS flush syringe, 2-6 mL, Intracatheter, Q1 MIN PRN  polyethylene glycol (MIRALAX) oral packet, 17 g, Oral, Daily  sennosides-docusate sodium (SENOKOT-S) 8.6-50mg  per tablet, 1 Tablet, Oral, 2x/day      Allergies   Allergen Reactions    Penicillins Rash    Ultram [Tramadol] Rash    Flagyl [Metronidazole]  Other Adverse Reaction (Add comment)     Migraine       Flexeril [Cyclobenzaprine]  Other Adverse Reaction (Add comment)     Migraine      Toradol [Ketorolac]  Other Adverse Reaction (Add comment)     Migraine         Family History  Family Medical History:       Problem Relation (Age of Onset)    Cancer Mother            Social History  Social History     Socioeconomic History    Marital status: Divorced     Spouse name: Not on file    Number of children: Not on file    Years of education: Not on file    Highest education level: Not on file   Occupational History    Not on file   Tobacco Use    Smoking status: Current Every Day Smoker     Packs/day: 1.00    Smokeless tobacco: Never Used   Vaping Use    Vaping Use: Never used   Substance and Sexual Activity    Alcohol use: Not Currently    Drug use: Yes     Types:  Marijuana     Comment: 1 every 2 weeks    Sexual activity: Not on file   Other Topics Concern  Not on file   Social History Narrative    Not on file     Social Determinants of Health     Financial Resource Strain:     Difficulty of Paying Living Expenses:    Food Insecurity:     Worried About Charity fundraiser in the Last Year:     Arboriculturist in the Last Year:    Transportation Needs:     Film/video editor (Medical):     Lack of Transportation (Non-Medical):    Physical Activity:     Days of Exercise per Week:     Minutes of Exercise per Session:    Stress:     Feeling of Stress :    Intimate Partner Violence:     Fear of Current or Ex-Partner:     Emotionally Abused:     Physically Abused:     Sexually Abused:        ROS:   Other than ROS in the HPI, all other systems were negative.    EXAM:  Temperature: 37 C (98.6 F)  Heart Rate: 87  BP (Non-Invasive): (!) 146/80  Respiratory Rate: 17  SpO2: 94 %    General: In no acute distress  HEENT: Atraumatic  Eyes: Conjunctiva non-icteric  Neck: Atraumatic, supple  CV: Regular rate and rhythm on monitor  Pulm: Equal bilateral chest rise, non labored breathing  Abd: Soft, tender to R/L flanks , nondistended   Extrem: No cyanosis or edema  Skin: Warm and Dry  Neuro: AAO x 3        Labs:    Lab Results Today:    Results for orders placed or performed during the hospital encounter of 06/17/20 (from the past 24 hour(s))   BASIC METABOLIC PANEL, NON-FASTING   Result Value Ref Range    SODIUM 141 136 - 145 mmol/L    POTASSIUM 3.8 3.5 - 5.1 mmol/L    CHLORIDE 106 96 - 111 mmol/L    CO2 TOTAL 26 22 - 30 mmol/L    ANION GAP 9 4 - 13 mmol/L    CALCIUM 9.3 8.5 - 10.0 mg/dL    GLUCOSE 88 65 - 125 mg/dL    BUN 11 8 - 25 mg/dL    CREATININE 0.60 0.60 - 1.05 mg/dL    BUN/CREA RATIO 18 6 - 22    ESTIMATED GFR >90 >=60 mL/min/BSA   MAGNESIUM   Result Value Ref Range    MAGNESIUM 1.8 1.8 - 2.6 mg/dL   HEPATIC FUNCTION PANEL   Result Value Ref Range    ALBUMIN 3.4 (L) 3.5 - 5.0  g/dL     ALKALINE PHOSPHATASE 159 (H) 40 - 110 U/L    ALT (SGPT) 64 (H) 8 - 22 U/L    AST (SGOT)  18 8 - 45 U/L    BILIRUBIN TOTAL 0.4 0.3 - 1.3 mg/dL    BILIRUBIN DIRECT 0.1 0.1 - 0.4 mg/dL    PROTEIN TOTAL 6.2 (L) 6.4 - 8.3 g/dL   THYROXINE, FREE (FREE T4)   Result Value Ref Range    THYROXINE (T4), FREE 2.25 (H) 0.70 - 1.25 ng/dL   CBC WITH DIFF   Result Value Ref Range    WBC 7.3 3.7 - 11.0 x103/uL    RBC 4.32 3.85 - 5.22 x106/uL    HGB 12.3 11.5 - 16.0 g/dL    HCT 37.8 34.8 - 46.0 %    MCV 87.5 78.0 - 100.0 fL  MCH 28.5 26.0 - 32.0 pg    MCHC 32.5 31.0 - 35.5 g/dL    RDW-CV 12.8 11.5 - 15.5 %    PLATELETS 199 150 - 400 x103/uL    MPV 10.4 8.7 - 12.5 fL    NEUTROPHIL % 46 %    LYMPHOCYTE % 47 %    MONOCYTE % 5 %    EOSINOPHIL % 1 %    BASOPHIL % 0 %    NEUTROPHIL # 3.34 1.50 - 7.70 x103/uL    LYMPHOCYTE # 3.41 1.00 - 4.80 x103/uL    MONOCYTE # 0.37 0.20 - 1.10 x103/uL    EOSINOPHIL # <0.10 <=0.50 x103/uL    BASOPHIL # <0.10 <=0.20 x103/uL    IMMATURE GRANULOCYTE % 1 0 - 1 %    IMMATURE GRANULOCYTE # 0.10 (H) <0.10 x103/uL       Imaging Studies:     MRI MRCP WO CONTRAST performed on 06/18/2020 5:15 AM.     REASON FOR EXAM:  u/s w/hepatomegaly at outside facility with elevated  LFTs, s/p cholecystectomy 5 years ago        TECHNIQUE: MRI/MRCP without IV contrast     COMPARISON: Liver ultrasound 06/16/2020; CT abdomen and pelvis A/9/21     FINDINGS:    LIVER: Liver is at the upper limits of normal for size. No significant  hepatic steatosis. There is mild prominence of the intrahepatic bile ducts  and mild distention of the extrahepatic bile duct measuring 9 mm in  diameter at the porta hepatis. The distal bile duct appears to taper  appropriately measuring 4 mm in diameter at the pancreatic head. No filling  defects are identified in the biliary tree to suspect choledocholithiasis.  No discrete liver mass identified on this noncontrast examination. Portal  and hepatic veins appear patent as partially  evaluated on this noncontrast  examination.     GALLBLADDER: Surgically absent     SPLEEN: Normal     ADRENALS: Normal.     PANCREAS: Normal without solid mass. Normal intrinsic T1 signal throughout  the pancreas. No dilatation of the main pancreatic duct.     KIDNEYS: Normal without solid mass. No hydronephrosis.     BOWEL: No small or large bowel wall thickening or obstruction. Visualized  stomach is normal in appearance without suspicious wall thickening.     PERITONEUM/MESENTERY: Small volume intrapelvic free fluid.     RETROPERITONEUM: Normal     VASCULAR: Aorta is normal in size without aneurysm.      LYMPH NODES:No upper abdominal lymphadenopathy.      BONES: No abnormal marrow signal in the lower lumbar spine.     ABDO/PELVIC WALL: No collections or hernia     OTHERS: Redemonstration of the mildly prominent endometrial lining  measuring up to 1.2 cm in thickness is partially visualized on the coronal  images.     IMPRESSION:  1. Liver is at the upper limits of normal for size without significant  hepatic steatosis. No discrete liver mass identified on this noncontrast  examination.  2. Mild prominence of the intrahepatic bile ducts and mild enlargement of  the extrahepatic bile duct measuring up to 9 mm in diameter. The distal  common bile duct tapers to measure 4 mm at the level of the pancreatic  head. No filling defects or obstructing lesion identified on this  noncontrast examination. These findings are likely to correlate of expected  physiologic change postcholecystectomy.    Fatigue/Debility: Chronic Weakness.  Fatigue  Berlinda Last, MD  [facility] Gastroenterology Fellow, PGY-5      Patient reviewed with the GI fellow. I was present for key portions of the H&P, and I participated in the plan of care. I agree with their findings and recommendations. Recommend EUS. May be done as OP.     Helaine Yackel J. Theodora Blow, MD FASGE   Professor of Medicine  Director of Advanced Therapeutic Endoscopy  [facility] Medicine

## 2020-06-18 NOTE — Nurses Notes (Signed)
Patient states she has had no n/v from eating clear liquid breakfast and lunch. Requesting advancement of diet. Page to Dr. Maren Beach of med 1 to request.

## 2020-06-18 NOTE — Care Management Notes (Signed)
[place] Management Initial Evaluation    Patient Name: Kara Barnes  Date of Birth: 1970-11-22  Sex: female  Date/Time of Admission: 06/17/2020  3:09 PM  Room/Bed: 707/A  Payor: MEDICAID [place] / Plan: [place] / Product Type: Medicaid Out of State /   Primary Care Providers:  System, Pcp Not In (General)    Pharmacy Info:   Preferred [facility] [place]    [place] [address]    Phone: [phone] Fax: [phone]    Hours: Not open 24 hours          Emergency Contact Info:   No emergency contact information on file.    History:   Kara Barnes is a 49 y.o., female, admitted for dilation of bile duct.    Height/Weight: 149.9 cm ('4\' 11"' ) / 64.7 kg (142 lb 10.2 oz)     LOS: 1 day   Admitting Diagnosis: Dilation of common bile duct [K83.8]    Assessment:      06/18/20 1829   Assessment Details   Assessment Type Admission   Date of Care Management Update 06/18/20   Date of Next DCP Update 06/20/20   Readmission   Is this a readmission? No   Care Management Plan   Discharge Planning Status initial meeting   Projected Discharge Date 06/23/20   Discharge plan discussed with: Patient   CM will evaluate for rehabilitation potential yes   Discharge Needs Assessment   Equipment Currently Used at Home none   Discharge Facility/Level of Care Needs Home (Patient/Family Member/other)(code 1)   Transportation Available *none   Referral Information   Admission Type inpatient   Address Verified verified-no changes   Arrived From critical access hospital   Insurance Verified verified-no change   ADVANCE DIRECTIVES   Does the Patient have an Advance Directive? No, Information Offered and Refused   Employment/Financial   Patient has Prescription Coverage?  Yes        Name of Insurance Coverage for Medications MD Medicaid   Financial Concerns no transportation   [place] an age  group to open "lives with" row.  Adult   Living Arrangements *homeless       Discharge Plan:  [facility] (code 1)  MSW met with pt at bedside to introduce role of CM and complete initial assessment. All information on chart verified; no changes. Pt uses Product/process development scientist in [place], [place] for medications. Pt has no history of [place] services. Pt uses no DME at home. Pt does not have a PCP at this time. Pt does not have MPOA on file and declined to receive information at this time. Pt also unwilling to provide any emergency contacts. Pt is currently homeless in [place] area of [place]. There are anticipated CM needs of homeless shelter/other appropriate facility placement upon d/c. Notification sent to Homelessness MSW [place] to please meet with pt on Monday 08/16 to discuss potential options.  The patient will continue to be evaluated for developing discharge needs.     Case Manager: Ivor Costa, [place]  Phone: [phone]

## 2020-06-18 NOTE — Care Plan (Signed)
Problem: Adult Inpatient Plan of Care  Goal: Plan of Care Review  Outcome: Ongoing (see interventions/notes)  Goal: Patient-Specific Goal (Individualized)  Outcome: Ongoing (see interventions/notes)  Goal: Absence of Hospital-Acquired Illness or Injury  Outcome: Ongoing (see interventions/notes)  Goal: Optimal Comfort and Wellbeing  Outcome: Ongoing (see interventions/notes)     Problem: Pain Acute  Goal: Acceptable Pain Control and Functional Ability  Outcome: Ongoing (see interventions/notes)     Problem: Depression  Goal: Improved Mood  Outcome: Ongoing (see interventions/notes)     Plan of care reviewed, assessment per doc flow. Pt alert and oriented x4, cooperative with care this shift. Pain meds given per MAR. Needs met with hourly rounding. Pt able to sleep between care. Pt maintained NPO for MRI/MRCP. Call bell within reach. No other concerns at this time.

## 2020-06-19 DIAGNOSIS — R000 Tachycardia, unspecified: Secondary | ICD-10-CM

## 2020-06-19 DIAGNOSIS — R079 Chest pain, unspecified: Secondary | ICD-10-CM

## 2020-06-19 LAB — CBC WITH DIFF
BASOPHIL #: <0.1 10*3/uL (ref <=0.20)
BASOPHIL %: 1 %
EOSINOPHIL #: 0.1 10*3/uL (ref <=0.50)
EOSINOPHIL %: 1 %
HCT: 43.6 % (ref 34.8–46.0)
HGB: 14.5 g/dL (ref 11.5–16.0)
IMMATURE GRANULOCYTE #: 0.1 10*3/uL — ABNORMAL HIGH (ref <0.10)
IMMATURE GRANULOCYTE %: 1 % (ref 0–1)
LYMPHOCYTE #: 3.77 10*3/uL (ref 1.00–4.80)
LYMPHOCYTE %: 36 %
MCH: 28.1 pg (ref 26.0–32.0)
MCHC: 33.3 g/dL (ref 31.0–35.5)
MCV: 84.5 fL (ref 78.0–100.0)
MONOCYTE #: 0.69 10*3/uL (ref 0.20–1.10)
MONOCYTE %: 7 %
MPV: 10 fL (ref 8.7–12.5)
NEUTROPHIL #: 5.9 10*3/uL (ref 1.50–7.70)
NEUTROPHIL %: 54 %
PLATELETS: 263 10*3/uL (ref 150–400)
RBC: 5.16 10*6/uL (ref 3.85–5.22)
RDW-CV: 12.5 % (ref 11.5–15.5)
WBC: 10.6 10*3/uL (ref 3.7–11.0)

## 2020-06-19 LAB — HEPATIC FUNCTION PANEL
ALBUMIN: 4 g/dL (ref 3.5–5.0)
ALKALINE PHOSPHATASE: 186 U/L — ABNORMAL HIGH (ref 40–110)
ALT (SGPT): 57 U/L — ABNORMAL HIGH (ref 8–22)
AST (SGOT): 17 U/L (ref 8–45)
BILIRUBIN DIRECT: 0.2 mg/dL (ref 0.1–0.4)
BILIRUBIN TOTAL: 0.6 mg/dL (ref 0.3–1.3)
PROTEIN TOTAL: 7.2 g/dL (ref 6.4–8.3)

## 2020-06-19 LAB — THYROXINE, FREE (FREE T4): THYROXINE (T4), FREE: 2.25 ng/dL — ABNORMAL HIGH (ref 0.70–1.25)

## 2020-06-19 LAB — BASIC METABOLIC PANEL
ANION GAP: 10 mmol/L (ref 4–13)
BUN/CREA RATIO: 14 (ref 6–22)
BUN: 9 mg/dL (ref 8–25)
CALCIUM: 10.5 mg/dL — ABNORMAL HIGH (ref 8.5–10.0)
CHLORIDE: 104 mmol/L (ref 96–111)
CO2 TOTAL: 27 mmol/L (ref 22–30)
CREATININE: 0.64 mg/dL (ref 0.60–1.05)
ESTIMATED GFR: >90 mL/min/BSA (ref >=60)
GLUCOSE: 95 mg/dL (ref 65–125)
POTASSIUM: 3.8 mmol/L (ref 3.5–5.1)
SODIUM: 141 mmol/L (ref 136–145)

## 2020-06-19 LAB — PHOSPHORUS: PHOSPHORUS: 4.1 mg/dL (ref 2.4–4.7)

## 2020-06-19 LAB — TROPONIN-I: TROPONIN I: <7 ng/L (ref 0–30)

## 2020-06-19 LAB — PARATHYROID HORMONE (PTH): PTH: 93 pg/mL — ABNORMAL HIGH (ref 8.5–77.0)

## 2020-06-19 LAB — MAGNESIUM: MAGNESIUM: 2 mg/dL (ref 1.8–2.6)

## 2020-06-19 MED ORDER — GI COCKTAIL (ANTACID SUSP, LIDOCAINE)
15.0000 mL | Freq: Once | Status: AC
Start: 2020-06-19 — End: 2020-06-19
  Administered 2020-06-19: 15 mL via ORAL
  Filled 2020-06-19: qty 15

## 2020-06-19 MED ORDER — LIDOCAINE 3.6 %-MENTHOL 1.25 % TOPICAL PATCH
1.0000 | MEDICATED_PATCH | Freq: Every day | CUTANEOUS | Status: DC
Start: 2020-06-19 — End: 2020-06-21
  Administered 2020-06-19 – 2020-06-21 (×3): 1 via TRANSDERMAL
  Filled 2020-06-19 (×3): qty 1

## 2020-06-19 MED ORDER — IPRATROPIUM 0.5 MG-ALBUTEROL 3 MG (2.5 MG BASE)/3 ML NEBULIZATION SOLN
INHALATION_SOLUTION | RESPIRATORY_TRACT | Status: AC
Start: 2020-06-19 — End: 2020-06-19
  Filled 2020-06-19: qty 15

## 2020-06-19 MED ORDER — FAMOTIDINE 20 MG TABLET
20.0000 mg | ORAL_TABLET | Freq: Two times a day (BID) | ORAL | Status: DC
Start: 2020-06-19 — End: 2020-06-21
  Administered 2020-06-19 – 2020-06-21 (×5): 20 mg via ORAL
  Filled 2020-06-19 (×5): qty 1

## 2020-06-19 MED ORDER — ELECTROLYTE-A INTRAVENOUS SOLUTION BOLUS
1000.0000 mL | Freq: Once | INTRAVENOUS | Status: AC
Start: 2020-06-19 — End: 2020-06-19
  Administered 2020-06-19: 0 mL via INTRAVENOUS
  Administered 2020-06-19: 1000 mL via INTRAVENOUS

## 2020-06-19 MED ORDER — ALPRAZOLAM 0.5 MG TABLET
1.0000 mg | ORAL_TABLET | Freq: Three times a day (TID) | ORAL | Status: DC
Start: 2020-06-19 — End: 2020-06-21
  Administered 2020-06-19 (×3): 1 mg via ORAL
  Administered 2020-06-20: 0 mg via ORAL
  Administered 2020-06-20 – 2020-06-21 (×5): 1 mg via ORAL
  Filled 2020-06-19 (×8): qty 2

## 2020-06-19 NOTE — Nurses Notes (Signed)
Attempted to administer Lovenox injection. Patient refused Lovenox. Educated on need for DVT prophylaxis and risk for blood clots. Patient verbalized understanding and continues to decline treatment. Educated on ways to reduce risk of blood clots including ambulating often throughout the entire shift.

## 2020-06-19 NOTE — Nurses Notes (Addendum)
06/19/20 1926   Pain   Pain Assessment Assessment   Pain Scale Used [facility]   Overall Pain Rating [facility] 7   Location CHEST   Location Orientation Left   Words to describe Other (Comment)  ("constant")   Duration Intermittent   Pain Intervention  PRN Medication;MD Notified (Chart in Comment)   Patient reports reoccurance of L sided chest pain. Patient also asking if she should order dinner now. Educated on NPO at midnight and reinforced need to decrease PO intake if she is experiencing 7/20 angina. Page to Dr. Kriste Basque to notify. PRN tylenol administered. VS obtained per flowsheet. Spoke with Dr. Humphrey Rolls and updated him on patient symptoms. He will speak with his senior and call back with directions.

## 2020-06-19 NOTE — Progress Notes (Signed)
INTERNAL MEDICINE  Progress Note     Name: Kara, Barnes, 49 y.o. female Date of Service: 06/19/2020   LPF:[id number] LOS: 2   PCP:Pcp Not In System Attending: Zollie Scale, MD   Code Status: Full Code Admitted for: Dilation of common bile duct     SUBJECTIVE:      Sleeping in bed on initial entry to room. Abd soft and non tender, however once dicussed decreasing of xanax dose, patient sat up, started complaining of toe and abdominal pain, and stated " I'm awake, don't decrease my xanax dose. What else am i supposed to do during the day other than sleep?" then, " I'm still going to get my dose later, right?"     NAEO. Patient sleeps for long periods following xanax administration which is unexpected given NARX check (+) for patient on this therapy for multiple months. Will decrease dose to improve alertness throughout the day.     During rounds pt c/o sudden onset CP, worse with deep breath and palpation. States she might need maalox. Burping during physical exam. Not concerning for cardiac etiology at this time.      OBJECTIVE:    EXAMINATION:  Temperature: 36.7 C (98.1 F) BP (Non-Invasive): (!) 116/50 (Manual, RN notified) Heart Rate: 89   Respiratory Rate: 15 SpO2: 91 % Weight: 64.7 kg (142 lb 10.2 oz)          Intake/Output Summary (Last 24 hours) at 06/19/2020 0726  Last data filed at 06/19/2020 0507  Gross per 24 hour   Intake 1800 ml   Output 850 ml   Net 950 ml     Last Bowel Movement: 06/18/20     General: Well-nourished, well-developed, in NAD.    HEENT: NC/AT, EOMI, Oropharynx clear, mucous membranes moist, Trachea midline.  Cardiovascular: RRR, no murmurs, rubs, gallops.  Respiratory: CTABL  Abdominal: Bowel sounds normal; abdomen soft, non-tender to palpation on distracted exam   Extremities/Skin: No peripheral edema. Warm and dry. Slight erythema of R 3rd toe, no acute signs of infection   Neurological: Awake, A&O x 3.     LABS:    CBC Differential   Recent Labs     06/17/20  0612 06/18/20  0343  06/19/20  0352   WBC 8.6 7.3 10.6   HGB 12.2 12.3 14.5   HCT 41.3 37.8 43.6   PLTCNT 203 199 263    Recent Labs     06/17/20  0612 06/17/20  0612 06/18/20  0343 06/19/20  0352   PMNS 56  --  46 54   MONOCYTES 5  --  5 7   BASOPHILS 0  <0.10   < > 0  <0.10 1  <0.10   PMNABS 4.73  --  3.34 5.90   LYMPHSABS 3.25  --  3.41 3.77   MONOSABS 0.45  --  0.37 0.69   EOSABS <0.10  --  <0.10 0.10    < > = values in this interval not displayed.      BMP LFTs   Recent Labs     06/19/20  0352   SODIUM 141   POTASSIUM 3.8   CHLORIDE 104   CO2 27   BUN 9   CREATININE 0.64   GLUCOSENF 95   ANIONGAP 10   BUNCRRATIO 14   GFR >90   CALCIUM 10.5*   MAGNESIUM 2.0   PHOSPHORUS 4.1   ALBUMIN 4.0    Recent Labs     06/19/20  0352  TOTALPROTEIN 7.2   ALBUMIN 4.0   TOTBILIRUBIN 0.6   BILIRUBINCON 0.2   AST 17   ALT 57*   ALKPHOS 186*      CoAgs Blood Gas:   No results found for this encounter No results found for this encounter    Cardiac Markers Lipid Panel   No results for input(s): TROPONINI, CKMB, MBINDEX, BNP in the last 72 hours. No results found for this encounter     IMAGING STUDIES:         PATHOLOGY & MICROBIOLOGY:    No results found for any visits on 06/17/20 (from the past 24 hour(s)).    INPATIENT MEDICATIONS:  acetaminophen (TYLENOL) tablet, 650 mg, Oral, Q4H PRN  albuterol (PROVENTIL) 2.5 mg / 3 mL (0.083%) neb solution, 2.5 mg, Nebulization, Q2H PRN  ALPRAZolam (XANAX) tablet, 2 mg, Oral, 3x/day  electrolyte-A (PLASMALYTE-A) bolus infusion 1,000 mL, 1,000 mL, Intravenous, Once  enoxaparin PF (LOVENOX) 40 mg/0.4 mL SubQ injection, 40 mg, Subcutaneous, Q24H  escitalopram (LEXAPRO) tablet, 20 mg, Oral, Daily  ipratropium-albuterol 0.5 mg-3 mg(2.5 mg base)/3 mL Solution for Nebulization, 3 mL, Nebulization, 4x/day  nicotine (NICODERM CQ) transdermal patch (mg/24 hr), 42 mg, Transdermal, Daily  nicotine polacrilex (COMMIT) lozenge, 2 mg, Oral, Q1H PRN  nicotine polacrilex (NICORETTE) chewing gum, 4 mg, Oral, Q1H  PRN  nortriptyline (PAMELOR) capsule, 10 mg, Oral, NIGHTLY  NS flush syringe, 2-6 mL, Intracatheter, Q8HRS   And  NS flush syringe, 2-6 mL, Intracatheter, Q1 MIN PRN  polyethylene glycol (MIRALAX) oral packet, 17 g, Oral, Daily  sennosides-docusate sodium (SENOKOT-S) 8.6-50mg  per tablet, 1 Tablet, Oral, 2x/day         ASSESSMENT:     Kara Barnes is a 49 y.o. female with PMH of multiple mood disorders, COPD, CVA, HTN, and thyroid disease, who presented to outside hospital  With back pain. She was admitted for further work up of CBD dilation.     Active Hospital Problems    Diagnosis    Primary Problem: Dilation of common bile duct        PLAN:   CBD dilation  s/p CCY  Shown on RUQ u/s (40mm) which recommended MRCP   Tolerating PO intake at outside hospital.   MRCP  WNL, gastroenterology consulted, planning EUS 8/16     Transaminitis  Hepatomegaly  HIV: negative   Hepatitis panel: Hep A negative and not-immune, Hep B surface Ag neg, Hep C equivocal x 2  Hep C PCR pending  RUQ u/s - CBD measures 11 mm, (+) hepatomegaly and steatosis     Serous cystadenoma Bilateral  S/P b/l ovarian resection 06/15/20  Thin prep pending.     - Comorbid Medical Conditions -   Anxiety - decrease Xanax 1 mg TID, GI cocktail PRN and lidocaine patch added.   Migraines - Continue Nortriptyline 10 mg qhs  Depression - Continue home Lexapro 20 mg  Iatrogenic Hypothyroidism - TSH undetectable, trending free T4 daily, hold levothyroxine   Tobacco Use Disorder  - Nicotine Replacement ordered     ___    DVT/PE Prophylaxis - Enoxaparin  Consults - none  Hardware (Lines, Drains, Foley, Tubes) - PIV  Diet - Regular  Activity - Ambulate w/ assitance  Therapy - None    ANTICIPATED DISPOSITION PLANNING   Needs -  Needs assistance with Transportation to [facility] on D/C. CM contacted and aware 8/14  Location - Home ,    Johnney Killian M.D. - 06/19/2020  [facility]  Internal Medicine, PGY-3      I saw and examined the patient.   I reviewed the resident's note.  I agree with the findings and plan of care as documented in the resident's note.  Any exceptions/additions are edited/noted.    Zollie Scale, MD

## 2020-06-19 NOTE — Nurses Notes (Signed)
Ambulated with patient approx 500 ft around unit and hallways. Patient tolerated well. States chest pain is "a little better" Will continue to monitor and encouraged mobility.

## 2020-06-19 NOTE — Nurses Notes (Signed)
Attempted to complete medication pass but patient sleeping so sound that she did not arouse when I entered room and spoke her name several times. Will attempt again later.

## 2020-06-19 NOTE — Nurses Notes (Addendum)
06/19/20 1012   Pain   Pain Assessment Assessment   Pain Scale Used [facility]   Overall Pain Rating [facility] 7   Location CHEST   Location Orientation Left   Words to describe   ("concerning")   Duration Intermittent   Activity at/before onset (chest) At rest   Onset & Symptoms (chest) Sudden   Radiates to (chest) None   Quality (chest)   ("concerning")   Pain Intervention  PRN Medication   Second Pain Site Present? Yes   Pain Score (Site 2) 7   Location (Site 2) BACK   While administering medication and updating patient on medication changes and POC. Patient suddenly reports a "chest pain" that is "concerning" while holding the L side of her chest. She states she is anxious and she doesn't "think it's a good idea" in regards to decreasing her alprazolam dose. BP 98/62, P 98 and regular. Notified Dr. Royce Macadamia of Med 1 and she will see the patient at bedside. Patient reports that "i have 50 thousand things going on " and refers to her socioeconomical struggles as well as some personal issues she is dealing with. Will continue to monitor. Educated on reducing caffeine intake.

## 2020-06-19 NOTE — Care Plan (Signed)
Problem: Adult Inpatient Plan of Care  Goal: Plan of Care Review  Outcome: Ongoing (see interventions/notes)  Flowsheets (Taken 06/19/2020 0649)  Plan of Care Reviewed With: patient  Progress: no change  Note: patient very talkative, talks about alot of friends and all there problems. also states how much pain she is in but yet up and around room. reaches over her bed to get phone off of charger etc. Doesnt appear in pain . Tylenol given prn . Call bell within reach . Frequent rounding done and needs met .       Problem: Adult Inpatient Plan of Care  Goal: Patient-Specific Goal (Individualized)  Outcome: Ongoing (see interventions/notes)     Problem: Pain Acute  Goal: Acceptable Pain Control and Functional Ability  Note: C/o pain across abdomen dull aching. Marland Kitchen

## 2020-06-20 ENCOUNTER — Observation Stay (HOSPITAL_COMMUNITY): Payer: Medicaid - Out of State | Admitting: Certified Registered"

## 2020-06-20 ENCOUNTER — Encounter (HOSPITAL_COMMUNITY): Admission: AD | Payer: Self-pay | Source: Other Acute Inpatient Hospital | Attending: Internal Medicine

## 2020-06-20 ENCOUNTER — Observation Stay (HOSPITAL_COMMUNITY): Payer: Medicaid - Out of State

## 2020-06-20 DIAGNOSIS — E039 Hypothyroidism, unspecified: Secondary | ICD-10-CM

## 2020-06-20 DIAGNOSIS — R933 Abnormal findings on diagnostic imaging of other parts of digestive tract: Secondary | ICD-10-CM

## 2020-06-20 DIAGNOSIS — F39 Unspecified mood [affective] disorder: Secondary | ICD-10-CM

## 2020-06-20 DIAGNOSIS — E0500 Thyrotoxicosis with diffuse goiter without thyrotoxic crisis or storm: Secondary | ICD-10-CM

## 2020-06-20 DIAGNOSIS — N83201 Unspecified ovarian cyst, right side: Secondary | ICD-10-CM

## 2020-06-20 DIAGNOSIS — N83202 Unspecified ovarian cyst, left side: Secondary | ICD-10-CM

## 2020-06-20 DIAGNOSIS — K760 Fatty (change of) liver, not elsewhere classified: Secondary | ICD-10-CM

## 2020-06-20 LAB — PHOSPHORUS: PHOSPHORUS: 4.5 mg/dL (ref 2.4–4.7)

## 2020-06-20 LAB — ECG 12-LEAD
Atrial Rate: 111 {beats}/min
Calculated P Axis: 78 degrees
Calculated R Axis: 64 degrees
Calculated T Axis: 68 degrees
PR Interval: 150 ms
QRS Duration: 84 ms
QT Interval: 324 ms
QTC Calculation: 440 ms
Ventricular rate: 111 {beats}/min

## 2020-06-20 LAB — CBC WITH DIFF
BASOPHIL #: <0.1 10*3/uL (ref <=0.20)
BASOPHIL %: 0 %
EOSINOPHIL #: 0.17 10*3/uL (ref <=0.50)
EOSINOPHIL %: 2 %
HCT: 41 % (ref 34.8–46.0)
HGB: 13.5 g/dL (ref 11.5–16.0)
IMMATURE GRANULOCYTE #: 0.11 10*3/uL — ABNORMAL HIGH (ref <0.10)
IMMATURE GRANULOCYTE %: 1 % (ref 0–1)
LYMPHOCYTE #: 3.35 10*3/uL (ref 1.00–4.80)
LYMPHOCYTE %: 33 %
MCH: 28.7 pg (ref 26.0–32.0)
MCHC: 32.9 g/dL (ref 31.0–35.5)
MCV: 87 fL (ref 78.0–100.0)
MONOCYTE #: 0.71 10*3/uL (ref 0.20–1.10)
MONOCYTE %: 7 %
MPV: 10 fL (ref 8.7–12.5)
NEUTROPHIL #: 5.88 10*3/uL (ref 1.50–7.70)
NEUTROPHIL %: 57 %
PLATELETS: 225 10*3/uL (ref 150–400)
RBC: 4.71 10*6/uL (ref 3.85–5.22)
RDW-CV: 12.9 % (ref 11.5–15.5)
WBC: 10.3 10*3/uL (ref 3.7–11.0)

## 2020-06-20 LAB — BASIC METABOLIC PANEL
ANION GAP: 11 mmol/L (ref 4–13)
BUN/CREA RATIO: 25 — ABNORMAL HIGH (ref 6–22)
BUN: 17 mg/dL (ref 8–25)
CALCIUM: 9.6 mg/dL (ref 8.5–10.0)
CHLORIDE: 106 mmol/L (ref 96–111)
CO2 TOTAL: 22 mmol/L (ref 22–30)
CREATININE: 0.67 mg/dL (ref 0.60–1.05)
ESTIMATED GFR: >90 mL/min/BSA (ref >=60)
GLUCOSE: 94 mg/dL (ref 65–125)
POTASSIUM: 4.1 mmol/L (ref 3.5–5.1)
SODIUM: 139 mmol/L (ref 136–145)

## 2020-06-20 LAB — THYROID STIMULATING IMMUNOGLOBULIN (TSI), SERUM: THYROID STIMULATING IMMUNOGLOBULIN (TSI), SERUM: <0.1 IU/L (ref <0.1)

## 2020-06-20 LAB — THYROXINE, FREE (FREE T4)
THYROXINE (T4), FREE: 1.53 ng/dL — ABNORMAL HIGH (ref 0.70–1.25)
THYROXINE (T4), FREE: 1.54 ng/dL — ABNORMAL HIGH (ref 0.70–1.25)

## 2020-06-20 LAB — VITAMIN D 25, TOTAL: VITAMIN D, 25OH: 19 ng/mL — ABNORMAL LOW (ref 30–100)

## 2020-06-20 LAB — THIN PREP PAP (QUEST)

## 2020-06-20 LAB — HEPATITIS C VIRUS (HCV) RNA DETECTION AND QUANTIFICATION, PCR, PLASMA: HCV QUANTITATIVE PCR: NOT DETECTED

## 2020-06-20 LAB — MAGNESIUM: MAGNESIUM: 1.9 mg/dL (ref 1.8–2.6)

## 2020-06-20 SURGERY — ENDOSCOPIC U/S UPPER
Anesthesia: Monitor Anesthesia Care | Site: Mouth | Wound class: Clean Contaminated Wounds-The respiratory, GI, Genital, or urinary

## 2020-06-20 MED ORDER — PROPOFOL 10 MG/ML INTRAVENOUS EMULSION
INTRAVENOUS | Status: DC | PRN
Start: 2020-06-20 — End: 2020-06-20
  Administered 2020-06-20: 200 ug/kg/min via INTRAVENOUS
  Administered 2020-06-20: 0 ug/kg/min via INTRAVENOUS
  Administered 2020-06-20: 100 ug/kg/min via INTRAVENOUS

## 2020-06-20 MED ORDER — PROPOFOL 10 MG/ML INTRAVENOUS EMULSION
INTRAVENOUS | Status: AC
Start: 2020-06-20 — End: 2020-06-20
  Filled 2020-06-20: qty 50

## 2020-06-20 MED ORDER — OLANZAPINE 5 MG TABLET
5.0000 mg | ORAL_TABLET | ORAL | Status: AC
Start: 2020-06-20 — End: 2020-06-20
  Administered 2020-06-20: 5 mg via ORAL
  Filled 2020-06-20 (×2): qty 1

## 2020-06-20 MED ORDER — LIDOCAINE (PF) 100 MG/5 ML (2 %) INTRAVENOUS SYRINGE
INJECTION | Freq: Once | INTRAVENOUS | Status: DC | PRN
Start: 2020-06-20 — End: 2020-06-20
  Administered 2020-06-20: 100 mg via INTRAVENOUS

## 2020-06-20 MED ORDER — LIDOCAINE (PF) 20 MG/ML (2 %) INJECTION SOLUTION
INTRAMUSCULAR | Status: AC
Start: 2020-06-20 — End: 2020-06-20
  Filled 2020-06-20: qty 5

## 2020-06-20 MED ORDER — LACTATED RINGERS INTRAVENOUS SOLUTION
INTRAVENOUS | Status: DC
Start: 2020-06-20 — End: 2020-06-20
  Administered 2020-06-20: 0 mL via INTRAVENOUS

## 2020-06-20 SURGICAL SUPPLY — 7 items
BLOCK BITE 20MM PE ADULT MOUTHPC STRAP RETENTION RIM LUM SCPSVR LF  LRG 27MM GRN NONST DISP (AIR) ×1 IMPLANT
CONV USE ITEM 343591 - SOLIDIFY FLUID 1500ML DSPNSR L_Q TX SOLIDIFY SFTP LTS+ DISP (STER) ×1 IMPLANT
ENDO KIT_CS/20 (ENDOSCOPIC SUPPLIES) ×1 IMPLANT
KIT ENDOS ENDOZIME BDSD SLR SPONGE ENZM DETERGENT 500ML (DIS) ×1 IMPLANT
SOLIDIFY FLUID 1500ML DSPNSR L_Q TX SOLIDIFY SFTP LTS+ DISP (STER) ×1
SOLUTION IRRG H2O 500CC 2F7113 (SOLUTIONS) ×1
WATER STRL 500ML PLASTIC PR BTL LF (SOLUTIONS) ×1 IMPLANT

## 2020-06-20 NOTE — Anesthesia Transfer of Care (Signed)
ANESTHESIA TRANSFER OF CARE   Kara Barnes is a 48 y.o. ,female, Weight: 64.7 kg (142 lb 10.2 oz)   had Procedure(s):  ENDOSCOPIC U/S UPPER  performed  06/20/20   Primary Service: Nonie Hoyer, MD    Past Medical History:   Diagnosis Date   . Anxiety    . Aortic valve disorder    . Arthropathy    . Cervical dysphagia 1995   . COPD (chronic obstructive pulmonary disease) (CMS [facility])    . Coronary artery disease    . CVA (cerebrovascular accident) (CMS [place])    . Depression    . HTN (hypertension)    . Migraine    . PTSD (post-traumatic stress disorder)    . Syncopal episodes    . Thyroid disease       Allergy History as of 06/20/20     PENICILLINS       Noted Status Severity Type Reaction    06/13/20 1931 Francina Ames, RN 06/13/20 Active Medium  Rash          METRONIDAZOLE       Noted Status Severity Type Reaction    06/13/20 1932 Francina Ames, RN 06/13/20 Active    Other Adverse Reaction (Add comment)    Comments: Migraine              TRAMADOL       Noted Status Severity Type Reaction    06/13/20 1932 Francina Ames, RN 06/13/20 Active Medium  Rash          CYCLOBENZAPRINE       Noted Status Severity Type Reaction    06/13/20 1933 Francina Ames, RN 06/13/20 Active    Other Adverse Reaction (Add comment)    Comments: Migraine             KETOROLAC       Noted Status Severity Type Reaction    06/13/20 1933 Francina Ames, RN 06/13/20 Active    Other Adverse Reaction (Add comment)    Comments: Migraine                 I completed my transfer of care / handoff to the receiving personnel during which we discussed:  Access, Airway, All key/critical aspects of case discussed, Analgesia, Antibiotics, Expectation of post procedure, Fluids/Product, Gave opportunity for questions and acknowledgement of understanding, Labs and PMHx                                              Additional Info:Report given to RN in area E                        Last OR Temp: Temperature: 36 C (96.8 F)  ABG:  POTASSIUM   Date  Value Ref Range Status   06/20/2020 4.1 3.5 - 5.1 mmol/L Final     KETONES   Date Value Ref Range Status   06/13/2020 Negative Negative mg/dL Final     CALCIUM   Date Value Ref Range Status   06/20/2020 9.6 8.5 - 10.0 mg/dL Final     Calculated P Axis   Date Value Ref Range Status   06/19/2020 78 degrees Final     Calculated R Axis   Date Value Ref Range Status   06/19/2020 64 degrees Final     Calculated T Axis  Date Value Ref Range Status   06/19/2020 68 degrees Final     Airway:* No LDAs found *  Blood pressure (!) 175/82, pulse 81, temperature 36 C (96.8 F), resp. rate (!) 24, height 1.499 m (4\' 11" ), weight 64.7 kg (142 lb 10.2 oz), SpO2 97 %, not currently breastfeeding.

## 2020-06-20 NOTE — Nurses Notes (Signed)
Walked into patient's room at shift change and she c\o of chest pain 7/10, nonradiating and constant. Pt does not endorse SOB but is requesting an EKG be done. She also reports that she's had a headache all night and only "slept no more than 2 hours". Text page sent to [facility] on service. Waiting for call or new orders. Will continue to monitor.     Leslye Peer, RN  06/20/2020, 07:37

## 2020-06-20 NOTE — Consults (Signed)
[facility]  Digestive Diseases Consult Follow Up      Kara, Barnes, 49 y.o. female  Date of Service: 06/20/2020  Date of Birth:  Mar 07, 1971    Hospital Day:  LOS: 3 days     Assessment/Recommendations:Kara Barnes is a 49 y.o., White female with past medical history of hypothyroidism, anxiety who presents with symptoms of abdominal pain. She has had bilateral ovarian resection on 06/15/2020 at [facility] which revealed serous cystadenoma bilaterally.     1-Abdominal pain, most likely secondary to the ovarian cyst.  Slight dilatation of common bile duct is a concern therefore will schedule the patient for EUS this admission +/- ERCP.    Vitals are stable, tachycardia, no fever no leukocytosis.  Bilirubin is normal, ALT is elevated, alk phos is elevated.  Will schedule the patient for EUS today.      US liver  Hepatomegaly and steatosis     Status post cholecystectomy and prominent common bile duct.      MRCP  1. Liver is at the upper limits of normal for size without significant hepatic steatosis. No discrete liver mass identified on this noncontrast  examination.  2. Mild prominence of the intrahepatic bile ducts and mild enlargement of the extrahepatic bile duct measuring up to 9 mm in diameter. The distal common bile duct tapers to measure 4 mm at the level of the pancreatic  head. No filling defects or obstructing lesion identified on this noncontrast examination. These findings are likely to correlate of expected  physiologic change postcholecystectomy.    Chief Complaint:  Abdominal poin   Subjective: no acute events over night.    Objective:  Temperature: 36.6 C (97.9 F)  Heart Rate: 96  BP (Non-Invasive): 129/63  Respiratory Rate: 18  SpO2: (!) 89 %     General: In no acute distress  HEENT: Atraumatic  Eyes: Conjunctiva non-icteric  Neck: Atraumatic, supple  CV: Regular rate and rhythm on monitor  Pulm: Equal bilateral chest rise, non labored breathing  Abd: Soft, tender to R/L flanks  , nondistended   Extrem: No cyanosis or edema  Skin: Warm and Dry  Neuro: AAO x 3    Labs:    Lab Results Today:    Results for orders placed or performed during the hospital encounter of 06/17/20 (from the past 24 hour(s))   TROPONIN-I   Result Value Ref Range    TROPONIN I <7 0 - 30 ng/L   BASIC METABOLIC PANEL, NON-FASTING   Result Value Ref Range    SODIUM 139 136 - 145 mmol/L    POTASSIUM 4.1 3.5 - 5.1 mmol/L    CHLORIDE 106 96 - 111 mmol/L    CO2 TOTAL 22 22 - 30 mmol/L    ANION GAP 11 4 - 13 mmol/L    CALCIUM 9.6 8.5 - 10.0 mg/dL    GLUCOSE 94 65 - 125 mg/dL    BUN 17 8 - 25 mg/dL    CREATININE 0.67 0.60 - 1.05 mg/dL    BUN/CREA RATIO 25 (H) 6 - 22    ESTIMATED GFR >90 >=60 mL/min/BSA   MAGNESIUM   Result Value Ref Range    MAGNESIUM 1.9 1.8 - 2.6 mg/dL   PHOSPHORUS   Result Value Ref Range    PHOSPHORUS 4.5 2.4 - 4.7 mg/dL   CBC WITH DIFF   Result Value Ref Range    WBC 10.3 3.7 - 11.0 x103/uL    RBC 4.71 3.85 - 5.22 x106/uL  HGB 13.5 11.5 - 16.0 g/dL    HCT 41.0 34.8 - 46.0 %    MCV 87.0 78.0 - 100.0 fL    MCH 28.7 26.0 - 32.0 pg    MCHC 32.9 31.0 - 35.5 g/dL    RDW-CV 12.9 11.5 - 15.5 %    PLATELETS 225 150 - 400 x103/uL    MPV 10.0 8.7 - 12.5 fL    NEUTROPHIL % 57 %    LYMPHOCYTE % 33 %    MONOCYTE % 7 %    EOSINOPHIL % 2 %    BASOPHIL % 0 %    NEUTROPHIL # 5.88 1.50 - 7.70 x103/uL    LYMPHOCYTE # 3.35 1.00 - 4.80 x103/uL    MONOCYTE # 0.71 0.20 - 1.10 x103/uL    EOSINOPHIL # 0.17 <=0.50 x103/uL    BASOPHIL # <0.10 <=0.20 x103/uL    IMMATURE GRANULOCYTE % 1 0 - 1 %    IMMATURE GRANULOCYTE # 0.11 (H) <0.10 x103/uL   THYROXINE, FREE (FREE T4)   Result Value Ref Range    THYROXINE (T4), FREE 1.53 (H) 0.70 - 1.25 ng/dL       Imaging Studies:  N/A    Vivien Rota, MD      Patient seen and examined with the GI fellow. I was present for key portions of the H&P, and I participated in the plan of care. I agree with their findings and recommendations. Proceed wit h EUS.    Dianne Bady J. Theodora Blow, MD FASGE    Professor of Medicine  Director of Advanced Therapeutic Endoscopy  [facility]

## 2020-06-20 NOTE — Transitional Care (Addendum)
[facility]   Transitional Care Coordination   Initial Assessment       Name: Kara Barnes   Date of Birth: 1970-12-05 49 y.o.  Date of service: 06/20/2020     Attempted to speak with patient. No answer at bedside phone. Left message at mobile number requesting return call.    06/20/20 1229: Patient in [place].    06/20/20 1458: Patient still in [place].    [address] [place], 06/20/2020  Ext. [phone]

## 2020-06-20 NOTE — Progress Notes (Signed)
[facility]  INTERNAL MEDICINE  Progress Note     Name: Sury, Wentworth, 49 y.o. female Date of Service: 06/20/2020   IDC:[id number] LOS: 3   PCP:Pcp Not In System Attending: Arlie Solomons, *   Code Status: Full Code Admitted for: Dilation of common bile duct     SUBJECTIVE:      Patient was somnolent on first examination in the morning. Was called by nursing later when patient was fully awake. She was concerned about upcoming procedure and became agitated requesting more Xanax. Discussed upcoming EUS and why it would be performed. Did not give more Xanax, but patient agreed to Zyprexa for anxiety reduction. Otherwise no over night acute events.     OBJECTIVE:    EXAMINATION:  Temperature: 36.6 C (97.9 F) BP (Non-Invasive): 129/63 Heart Rate: 96   Respiratory Rate: 18 SpO2: 92 % Weight: 64.7 kg (142 lb 10.2 oz)          Intake/Output Summary (Last 24 hours) at 06/20/2020 1128  Last data filed at 06/19/2020 1600  Gross per 24 hour   Intake 460 ml   Output -   Net 460 ml     Last Bowel Movement: 06/20/20     General: Well-nourished, well-developed, in NAD.  HEENT: NC/AT, EOMI, mucous membranes moist, Trachea midline.  Cardiovascular: RRR, no murmurs, rubs, gallops.  Respiratory: CTABL  GI: Bowel sounds normal; abdomen soft, non-tender to palpation  Extremities/Skin: No peripheral edema. Warm and dry.   Neurological: Awake, A&O x 3.     LABS:    CBC Differential   Recent Labs     06/18/20  0343 06/19/20  0352 06/20/20  0432   WBC 7.3 10.6 10.3   HGB 12.3 14.5 13.5   HCT 37.8 43.6 41.0   PLTCNT 199 263 225    Recent Labs     06/18/20  0343 06/18/20  0343 06/19/20  0352 06/20/20  0432   PMNS 46  --  54 57   MONOCYTES 5  --  7 7   BASOPHILS 0  <0.10   < > 1  <0.10 0  <0.10   PMNABS 3.34  --  5.90 5.88   LYMPHSABS 3.41  --  3.77 3.35   MONOSABS 0.37  --  0.69 0.71   EOSABS <0.10  --  0.10 0.17    < > = values in this interval not displayed.      BMP LFTs   Recent Labs     06/20/20  0432   SODIUM 139    POTASSIUM 4.1   CHLORIDE 106   CO2 22   BUN 17   CREATININE 0.67   GLUCOSENF 94   ANIONGAP 11   BUNCRRATIO 25*   GFR >90   CALCIUM 9.6   MAGNESIUM 1.9   PHOSPHORUS 4.5    No results found for this encounter   CoAgs Blood Gas:   No results found for this encounter No results found for this encounter    Cardiac Markers Lipid Panel   Recent Labs     06/19/20  2020   TROPONINI <7    No results found for this encounter     IMAGING STUDIES:         PATHOLOGY & MICROBIOLOGY:    No results found for any visits on 06/17/20 (from the past 24 hour(s)).    INPATIENT MEDICATIONS:  acetaminophen (TYLENOL) tablet, 650 mg, Oral, Q4H PRN  albuterol (PROVENTIL) 2.5 mg / 3 mL (  0.083%) neb solution, 2.5 mg, Nebulization, Q2H PRN  ALPRAZolam (XANAX) tablet, 1 mg, Oral, 3x/day  enoxaparin PF (LOVENOX) 40 mg/0.4 mL SubQ injection, 40 mg, Subcutaneous, Q24H  escitalopram (LEXAPRO) tablet, 20 mg, Oral, Daily  famotidine (PEPCID) tablet, 20 mg, Oral, 2x/day  lidocaine-menthol (LIDOPATCH) 3.6%-1.25% patch, 1 Patch, Transdermal, Daily  nicotine (NICODERM CQ) transdermal patch (mg/24 hr), 42 mg, Transdermal, Daily  nicotine polacrilex (COMMIT) lozenge, 2 mg, Oral, Q1H PRN  nortriptyline (PAMELOR) capsule, 10 mg, Oral, NIGHTLY  NS flush syringe, 2-6 mL, Intracatheter, Q8HRS   And  NS flush syringe, 2-6 mL, Intracatheter, Q1 MIN PRN  OLANZapine (ZYPREXA) tablet, 5 mg, Oral, Now  polyethylene glycol (MIRALAX) oral packet, 17 g, Oral, Daily  sennosides-docusate sodium (SENOKOT-S) 8.6-50mg  per tablet, 1 Tablet, Oral, 2x/day         ASSESSMENT:     Shannen Flansburg is a 49 y.o. female with PMH of multiple mood disorders, COPD, CVA, HTN, and thyroid disease, who presented tooutside hospitalwith back pain.Shewas admitted for further work up of CBD dilation.    Active Hospital Problems    Diagnosis   . Primary Problem: Dilation of common bile duct        PLAN:   CBD dilation s/p CCY   Shown on RUQ u/s (52mm) which recommended MRCP   MRCPWNL,  gastroenterology consulted, planning EUS 8/16   Tolerating PO intake at outside hospital    Transaminitis  Hepatomegaly   HIV: negative    Hepatitis panel:Hep A negative and not-immune, Hep B surface Ag neg, Hep C equivocal x 2   Hep C PCR pending   RUQ u/s- CBD measures 11 mm, (+) hepatomegaly and steatosis    Serous cystadenoma Bilateral   S/P b/l ovarian resection 06/15/20   Thin prep pending    - Chronic Medical Conditions Managed During This Admission -   Anxiety- Xanax 1 mg TID, GI cocktail PRN and lidocaine patch added. One time Zyprexa PRN.  Migraines- Continue Nortriptyline 10 mg qhs  Depression- Continue home Lexapro 20 mg  Iatrogenic Hypothyroidism- TSH undetectable, trending free T4 daily, hold levothyroxine   Tobacco Use Disorder - Nicotine Replacement ordered  ___    DVT/PE Prophylaxis - Enoxaparin  Consults - none  Hardware (Lines, Drains, Foley, Tubes) - PIV  Diet - Regular  Activity - Ambulate w/ assitance  Therapy - None    ANTICIPATED DISPOSITION PLANNING   Needs - Will need transportation to [facility] on D/C. CM contacted and aware 8/14.  Location - [place], [place] - 06/20/2020  [place] to my other note form today.

## 2020-06-20 NOTE — Anesthesia Postprocedure Evaluation (Signed)
Anesthesia Post Op Evaluation    Patient: Kara Barnes  Procedure(s):  ENDOSCOPIC U/S UPPER    Last Vitals:Temperature: 36.6 C (97.9 F) (06/20/20 1227)  Heart Rate: (!) 102 (06/20/20 1227)  BP (Non-Invasive): 123/73 (06/20/20 1227)  Respiratory Rate: 16 (06/20/20 1227)  SpO2: 95 % (06/20/20 8934)    No complications documented.    Patient is sufficiently recovered from the effects of anesthesia to participate in the evaluation and has returned to their pre-procedure level.  Patient location during evaluation: PACU       Patient participation: complete - patient participated  Level of consciousness: awake and alert and responsive to verbal stimuli    Pain management: adequate  Airway patency: patent    Anesthetic complications: no  Cardiovascular status: acceptable  Respiratory status: acceptable  Hydration status: acceptable  Patient post-procedure temperature: Pt Normothermic   PONV Status: Absent

## 2020-06-20 NOTE — Care Plan (Signed)
Problem: Depression  Goal: Improved Mood  Outcome: Ongoing (see interventions/notes)     Problem: Adult Inpatient Plan of Care  Goal: Plan of Care Review  Outcome: Ongoing (see interventions/notes)     Problem: Adult Inpatient Plan of Care  Goal: Patient-Specific Goal (Individualized)  Outcome: Ongoing (see interventions/notes)  Flowsheets (Taken 06/20/2020 1549)  Individualized Care Needs: ERCP     Problem: Adult Inpatient Plan of Care  Goal: Absence of Hospital-Acquired Illness or Injury  Outcome: Ongoing (see interventions/notes)     Problem: Adult Inpatient Plan of Care  Goal: Optimal Comfort and Wellbeing  Outcome: Ongoing (see interventions/notes)     Problem: Adult Inpatient Plan of Care  Goal: Rounds/Family Conference  Outcome: Ongoing (see interventions/notes)     Problem: Pain Acute  Goal: Acceptable Pain Control and Functional Ability  Outcome: Ongoing (see interventions/notes)     Problem: Fall Injury Risk  Goal: Absence of Fall and Fall-Related Injury  Outcome: Ongoing (see interventions/notes)     Patient alert and oriented. Patient at ERCP. PRN medications administered for pain. Patient had one time dose Zyprexa for anxiety. Patient does get frustrated and agitated at times.     Estelle Grumbles, RN  06/20/2020, 15:55

## 2020-06-20 NOTE — Anesthesia Preprocedure Evaluation (Signed)
ANESTHESIA PRE-OP EVALUATION  Planned Procedure: ENDOSCOPIC U/S UPPER (N/A Mouth)  Review of Systems                   Pulmonary     Cardiovascular    No peripheral edema,        GI/Hepatic/Renal        Endo/Other          Neuro/Psych/MS        Cancer                     Physical Assessment      Airway           Mouth Opening: good.            Dental                    Pulmonary    Breath sounds clear to auscultation  (-) no rhonchi, no decreased breath sounds, no wheezes, no rales and no stridor     Cardiovascular    Rhythm: regular  Rate: Normal  (-) no friction rub, carotid bruit is not present, no peripheral edema and no murmur     Other findings            Plan  ASA 3     Planned anesthesia type: MAC              Intravenous induction       Anesthetic plan and risks discussed with patient.          Patient's NPO status is appropriate for Anesthesia.           Plan discussed with CRNA.            Urine Pregnancy Results: Negative

## 2020-06-20 NOTE — Nurses Notes (Addendum)
Entered room to administer morning medications. Patient sleeping, attempted to arouse patient to administer medications - patient awoke and began to punch the side rail stating "leave me alone, let me sleep". Notified patient that these medications do include her scheduled xanax that she was requesting early this am from the nightshift RN. Patient stated "not right now". Will attempt again shortly to administer medications and assess patient.

## 2020-06-20 NOTE — Nurses Notes (Signed)
Patient to ERCP.

## 2020-06-20 NOTE — Care Management Notes (Signed)
[facility]  Care Management Note    Patient Name: Kara Barnes  Date of Birth: [birth year]  Sex: female  Date/Time of Admission: 06/17/2020  3:09 PM  Room/Bed: 707/A  Payor: MEDICAID [place] / Plan: [place] / Product Type: Medicaid Out of State /    LOS: 0 days   Primary Care Providers:  System, Pcp Not In (General)    Admitting Diagnosis:  Dilation of common bile duct [K83.8]    Assessment:      06/20/20 1705   Assessment Details   Assessment Type Continued Assessment   Date of Care Management Update 06/20/20   Date of Next DCP Update 06/23/20   Care Management Plan   Discharge Planning Status plan in progress   Projected Discharge Date 06/21/20   Discharge Needs Assessment   Discharge Facility/Level of Care Needs Home (Patient/Family Member/other)(code 1)       Discharge Plan:  [place]  Per TBR with service, patient continues to be treated medically. GI consulted, plan for EUS study today. Service anticipating discharge tomorrow. Patient is homeless, from [place], [place] area. [place] left voicemail for MSW-Lawrencia Osborne Casco today, MSW is out of office today.    [facility] following up tomorrow morning. Anticipate discharge once medically stable, will likely require transportation to homeless shelter.    The patient will continue to be evaluated for developing discharge needs.     Case Manager: Genella Rife, [place] COORDINATOR  Phone: [phone]

## 2020-06-20 NOTE — Care Plan (Signed)
Plan of care reviewed. Pt AOx4 and cooperative with care this shift. Pain managed with meds per MAR. Assessment per doc flow. SS refused. Pt free from falls this shift. Pt NPO for ERCP procedure today. Needs met with hourly rounding. Pt resting at this time with call bell in reach. Wheels locked and bed in lowest position. Will continue to monitor.     Problem: Depression  Goal: Improved Mood  Outcome: Ongoing (see interventions/notes)     Problem: Adult Inpatient Plan of Care  Goal: Plan of Care Review  Outcome: Ongoing (see interventions/notes)  Goal: Patient-Specific Goal (Individualized)  Outcome: Ongoing (see interventions/notes)  Goal: Absence of Hospital-Acquired Illness or Injury  Outcome: Ongoing (see interventions/notes)  Goal: Optimal Comfort and Wellbeing  Outcome: Ongoing (see interventions/notes)  Goal: Rounds/Family Conference  Outcome: Ongoing (see interventions/notes)     Problem: Pain Acute  Goal: Acceptable Pain Control and Functional Ability  Outcome: Ongoing (see interventions/notes)     Problem: Fall Injury Risk  Goal: Absence of Fall and Fall-Related Injury  Outcome: Ongoing (see interventions/notes)       [facility] Highest Level of Mobility Goal      Date: 06/20/20     JH-HLM Goal:  8    Exercise Level: 8- Walked 250 feet or more    Goal Outcome: Goal Achieved    Leslye Peer, RN  06/20/2020, 00:13

## 2020-06-20 NOTE — Progress Notes (Addendum)
[facility]  Medicine Progress Note  Full Code    Kara Barnes  Date of service: 06/20/2020    Subjective: Patient sleeping on exam this morning. She is arousable and appropriate when she awakens. She denies any chest pain at my time of exam. She later sleeps through majority of the physical exam. She does not have any abdominal pain or shortness of breath currently. She does not have a PCP in [place].     Vital Signs:  Temp (24hrs) Max:37 C (69.4 F)      Systolic (85IOE), VOJ:500 , Min:123 , XFG:182     Diastolic (99BZJ), IRC:78, Min:60, Max:110    Temp  Avg: 36.6 C (97.8 F)  Min: 36 C (96.8 F)  Max: 37 C (98.6 F)  MAP (Non-Invasive)  Avg: 92.1 mmHG  Min: 80 mmHG  Max: 120 mmHG  Pulse  Avg: 94.8  Min: 76  Max: 109  Resp  Avg: 17.3  Min: 16  Max: 24  SpO2  Avg: 93.4 %  Min: 90 %  Max: 97 %     Fi02    I/O:  I/O last 24 hours:    Intake/Output Summary (Last 24 hours) at 06/20/2020 1559  Last data filed at 06/20/2020 1505  Gross per 24 hour   Intake 760 ml   Output -   Net 760 ml     I/O current shift:  08/16 0700 - 08/16 1859  In: 400 [I.V.:400]  Out: -     acetaminophen (TYLENOL) tablet, 650 mg, Oral, Q4H PRN  albuterol (PROVENTIL) 2.5 mg / 3 mL (0.083%) neb solution, 2.5 mg, Nebulization, Q2H PRN  ALPRAZolam (XANAX) tablet, 1 mg, Oral, 3x/day  enoxaparin PF (LOVENOX) 40 mg/0.4 mL SubQ injection, 40 mg, Subcutaneous, Q24H  escitalopram (LEXAPRO) tablet, 20 mg, Oral, Daily  famotidine (PEPCID) tablet, 20 mg, Oral, 2x/day  lidocaine-menthol (LIDOPATCH) 3.6%-1.25% patch, 1 Patch, Transdermal, Daily  nicotine (NICODERM CQ) transdermal patch (mg/24 hr), 42 mg, Transdermal, Daily  nicotine polacrilex (COMMIT) lozenge, 2 mg, Oral, Q1H PRN  nortriptyline (PAMELOR) capsule, 10 mg, Oral, NIGHTLY  NS flush syringe, 2-6 mL, Intracatheter, Q8HRS   And  NS flush syringe, 2-6 mL, Intracatheter, Q1 MIN PRN  polyethylene glycol (MIRALAX) oral packet, 17 g, Oral, Daily  sennosides-docusate sodium (SENOKOT-S) 8.6-50mg  per  tablet, 1 Tablet, Oral, 2x/day        Allergies   Allergen Reactions   . Penicillins Rash   . Ultram [Tramadol] Rash   . Flagyl [Metronidazole]  Other Adverse Reaction (Add comment)     Migraine      . Flexeril [Cyclobenzaprine]  Other Adverse Reaction (Add comment)     Migraine     . Toradol [Ketorolac]  Other Adverse Reaction (Add comment)     Migraine         Physical Exam:  Gen: No distress  Abd: Soft, non tender, non distended BS +  Cardiac: RRR, no murmurs.  Respiratory: CTAB  Psych: Poor eye contact, does have minimal participation in the interview. Labile moods.  Extremities: no edema    Labs:  I have reviewed laboratory results from this admission  Free T4 still elevated.   Normal renal function.   Normal WBC. No anemia.   PTH elevated though Vit D deficient.  No hyperbilirubinemia  ALT remains elevated at 57.    Radiology:  EUS today    Microbiology:  Hepatitis A/B/C negative    Consults: GI    Hardware (lines, foley's, tubes):  PIV    Assessment/ Plan:   Active Hospital Problems    Diagnosis   . Primary Problem: Dilation of common bile duct       49 year old female transferred here for common bile duct dilatation status post cholecystectomy, after hospitalization outside facility for abdominal pain found to have significant ovarian cyst, now status post laporascopic BSO, pathology showing serous cystadenoma.  She does have a history of cervical dysplasia, she is currently pending then prepped for further evaluation per chart review.  Other notable past medical history includes mood disorder, history of Graves disease status post RAI with subsequent hypothyroidism with supratherapeutic levels currently.    CBD dilatation:  May be secondary to post cholecystectomy status, however GI is performing an EUS today.  NPO currently. ALT remains  mildly elevated, and we are pending Hepatitis C PCR, which may be underlying cause of ALT elevation.     Recent laparoscopic BSO, with removal of ovarian cyst, pathology  confirms serous cystadenoma:  Continue Tylenol as needed for pain.    History of mood disorder:  Overall unclear diagnosis, continue home Lexapro 20 mg daily, nicotine replacement for tobacco use disorder, nortriptyline 10 mg q.h.s. For insomnia.  Patient reports that she has a history of panic disorder and was previously on 2 mg Xanax t.i.d. However this was decreased due to concern for drug interaction, as well as noted somnolence while hospitalized.  Patient does state that she prefers to have dosed escalated however we did discuss that we do not feel comfortable at that dose and we recommend that she continue to wean this medication while in the hospital as an outpatient.  Additionally, she is unclear where she is going to receive follow up in dose reduction may be safer option for her moving forward.  This may be exacerbated b patient's supratherapeutic T4 levels.    History of Graves disease, status post RAI, now on separate:  Continue to hold home Synthroid, will decrease (previously on 200 mcg daily) dosing and resume as T4 levels return to normal.        DVT/PE Prophylaxis: Enoxaparin    Disposition Planning: Home discharge    Patient is currently without a stable living situation, we have contacted social work further assistance as well as for potential transportation. We have attempted to arrange reliable transitional care, and follow with her primary care physician, a as well as Psychiatry, however we are running into difficulties with this based on the patient's insurance.  We have contacted the transitional care team further assistance.      Arlie Solomons, MD

## 2020-06-20 NOTE — Discharge Summary (Signed)
[facility]  DISCHARGE SUMMARY    PATIENT NAME:  Kara Barnes, Kara Barnes  MRN:  [id number]  DOB:  November 11, 1970    ENCOUNTER DATE:  06/17/2020  INPATIENT ADMISSION DATE:   DISCHARGE DATE:  06/21/2020    ATTENDING PHYSICIAN: Tinita Brooker, Kae Heller, MD  SERVICE: MEDICINE 1  PRIMARY CARE PHYSICIAN: Pcp Not In System     Has PCP been verified with patient and updated? 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     Active Non-Hospital Problems    Diagnosis Date Noted    PTSD (post-traumatic stress disorder) 06/21/2020    S/P bilateral salpingo-oophorectomy 06/16/2020    Migraine     Depression     Anxiety     COPD (chronic obstructive pulmonary disease) (CMS HCC)     Syncopal episodes     Coronary artery disease     HTN (hypertension)     Aortic valve disorder     Cervical dysphagia 1995        DISCHARGE MEDICATIONS:     Current Discharge Medication List        CONTINUE these medications which have CHANGED during your visit.        Details   ALPRAZolam 2 mg Tablet  Commonly known as: XANAX  What changed: how much to take   1 mg, Oral, 3 TIMES DAILY PRN  Refills: 0     levothyroxine 200 mcg Tablet  Commonly known as: SYNTHROID  What changed: medication strength   200 mcg, Oral, EVERY MORNING  Qty: 30 Tablet  Refills: 0            CONTINUE these medications - NO CHANGES were made during your visit.        Details   acetaminophen 500 mg Tablet  Commonly known as: TYLENOL   500 mg, Oral, EVERY 4 HOURS PRN  Refills: 0     escitalopram oxalate 20 mg Tablet  Commonly known as: LEXAPRO   20 mg, Oral, DAILY  Refills: 0     nortriptyline 10 mg Capsule  Commonly known as: PAMELOR   10 mg, Oral, NIGHTLY  Refills: 0            Discharge med list refreshed?  YES      During this hospitalization did the patient have an AMI, PCI/PCTA, STENT or Isolated CABG?  No              ALLERGIES:  Allergies   Allergen Reactions    Penicillins Rash    Ultram [Tramadol] Rash    Flagyl [Metronidazole]  Other Adverse Reaction (Add comment)     Migraine       Flexeril [Cyclobenzaprine]  Other Adverse Reaction (Add comment)     Migraine      Toradol [Ketorolac]  Other Adverse Reaction (Add comment)     Migraine       HOSPITAL PROCEDURE(S):   Bedside Procedures:  Orders Placed This Encounter   Procedures    ENDOSCOPIC ULTRASOUND UPPER     Surgical Procedure(s):  ENDOSCOPIC U/S UPPER - Wound Class: Clean Contaminated Wounds-The respiratory, GI, Gential, or urinary    [place]  COURSE     BRIEF HPI:  This is a 49 y.o., female with PMH anxiety, depression and iatrogenic hypothyroidism who presented as a transfer from outside hospital with abdominal pain and found to have large B/L adnexal masses. She underwent B/L salpingo-oophorectomy with mass removal. Incidentally she was found to have transaminits and underwent a RUQ u/s that demonstrated a mild CBD dilation of 56mm. An MRCP was recommended with transfer to [facility] for completion.    BRIEF HOSPITAL NARRATIVE:   Patient underwent MRCP which showed liver margins upper limits of normal w/o significant hepatic steatosis or discrete masses. There was mild enlargement of the extrahepatic bile duct that tapered to the level of the pancreatic head which was consistent with expected findings of s/p cholecystectomy. A follow up endoscopic u/s was completed to evaulate further and demonstrated mildly dilated CBD most suggestive of benign papillary stenosis. She tested negative for acute infection of Hep A and B and had a negative Hep C PCR.    Patient has past history of iatrogenic hyperthyroidism w/questionable Graves s/p RAI and was on synthroid 362mcg daily. She was found to have an detectable TSH and elevated free T4 on  admission. Her synthroid dosage was stopped which resolved the elevated free T4 and will be continued at 255mcg daily. Thyroid stimulating immunoglobin (TSI) negative. Of note, patient has also had mild hypercalcemia throughout her stay. She was found to have a slightly elevated PTH and low Vitamin D levels. This should be something she should follow up with in the future.    Patient has history of anxiety being treated with Xanax 2mg  TID. Patient would have extreme episodes of somnolence on this dosage. She was reduced down to 1mg  Xanax TID and has been more alert at this dosage. Recommend continued reduced dosage moving forward.     TRANSITION/POST DISCHARGE CARE/PENDING TESTS/REFERRALS:  Does not have in state insurance given home state is [employer]  Recommend follow-up at [place] [facility]. [phone] at [address]. [place], [place] [address]  Recommend follow-up at [facility]. Phone: [phone] at [place], [place] [address]  F/u PTH and mild hypercalcemia, recommend to repeat labs in 1-2 weeks  Needs TSH & PTH followed, recommend repeat labs in 4-6 weeks    CONDITION ON DISCHARGE:  A. Ambulation: Full ambulation  B. Self-care Ability: Complete  C. Cognitive Status Alert and Oriented x 3  D. Code status at discharge:   Code Status Information       Code Status Advance Care Planning    Full Code Jump to the Activity          LINES/DRAINS/WOUNDS AT DISCHARGE:   Patient Lines/Drains/Airways Status       Active Line / Dialysis Catheter / Dialysis Graft / Drain / Airway / Wound       Name Placement date Placement time Site Days    Peripheral IV Right Basilic  (medial side of arm) 06/18/20  2200   2                  DISCHARGE DISPOSITION:  Home discharge    DISCHARGE INSTRUCTIONS:   Follow-up Information       [facility], [place] .    Specialty: Internal Medicine  Contact information:  [address]  [place] [address]  [phone]  Additional information:  Your Health is our [place] are currently suspended due  to COVID-19 restrictions at this [facility] outpatient clinic. We apologize for any inconvenience this may cause. Please ask an attendant for assistance if needed.             [facility] In 1 week.    Why: Clinic open for primary care every Wednesday at 5:00 PM and 1st Tuesday of the month at 5 PM  Contact information:  Phone: [phone]  Address: [place], [place] [address]                            Refer to [facility]   Referral Type: Physician Referral-Office Visits   Number of Visits Requested: 1     Refer to [place]   Referral Type: Physician Referral-Office Visits   Number of Visits Requested: 1     DISCHARGE INSTRUCTION - DIET     Diet: RESUME HOME DIET      DISCHARGE INSTRUCTION - ACTIVITY     Activity: GRADUALLY INCREASE ACTIVITY AS TOLERATED    Activity: NO LIFTING OVER 10 POUNDS FOR 6 WEEKS      DISCHARGE INSTRUCTION - INCISION/WOUND CARE     Instructions for incision/wound care: Keep Incision Clean and Dry    Instructions for incision/wound care: NO Tub Baths / Pools for 1 week      [place]    Video or telephone please.     Reason for visit: HOSPITAL DISCHARGE    Follow-up in: 3 DAYS    Follow-up reason: No PCP, continuity of care, assist with coordination of care      Evonnie Pat, MS4    Copies sent to Care Team         Relationship Specialty Notifications Start End    System, Pcp Not In PCP - General   06/13/20           Referring providers can utilize [email or web address] to access their referred [place] patient's information.      I saw and examined the patient.  I reviewed the resident's note.  I agree with the findings and plan of care as documented in the resident's note.  Any  exceptions/additions are edited/noted.    Arlie Solomons, MD

## 2020-06-20 NOTE — Nurses Notes (Signed)
Patient returned from OR lethargic, patient does arouse to tactile stimuli. Service notified. Holding oral medications at this time.

## 2020-06-21 DIAGNOSIS — F4310 Post-traumatic stress disorder, unspecified: Secondary | ICD-10-CM | POA: Insufficient documentation

## 2020-06-21 LAB — CBC WITH DIFF
BASOPHIL #: <0.1 10*3/uL (ref <=0.20)
BASOPHIL %: 0 %
EOSINOPHIL #: 0.17 10*3/uL (ref <=0.50)
EOSINOPHIL %: 2 %
HCT: 42.7 % (ref 34.8–46.0)
HGB: 14.3 g/dL (ref 11.5–16.0)
IMMATURE GRANULOCYTE #: 0.12 10*3/uL — ABNORMAL HIGH (ref <0.10)
IMMATURE GRANULOCYTE %: 1 % (ref 0–1)
LYMPHOCYTE #: 3.81 10*3/uL (ref 1.00–4.80)
LYMPHOCYTE %: 40 %
MCH: 28.8 pg (ref 26.0–32.0)
MCHC: 33.5 g/dL (ref 31.0–35.5)
MCV: 86.1 fL (ref 78.0–100.0)
MONOCYTE #: 0.71 10*3/uL (ref 0.20–1.10)
MONOCYTE %: 7 %
MPV: 9.9 fL (ref 8.7–12.5)
NEUTROPHIL #: 4.8 10*3/uL (ref 1.50–7.70)
NEUTROPHIL %: 50 %
PLATELETS: 235 10*3/uL (ref 150–400)
RBC: 4.96 10*6/uL (ref 3.85–5.22)
RDW-CV: 12.7 % (ref 11.5–15.5)
WBC: 9.6 10*3/uL (ref 3.7–11.0)

## 2020-06-21 LAB — BASIC METABOLIC PANEL
ANION GAP: 11 mmol/L (ref 4–13)
BUN/CREA RATIO: 31 — ABNORMAL HIGH (ref 6–22)
BUN: 18 mg/dL (ref 8–25)
CALCIUM: 10.1 mg/dL — ABNORMAL HIGH (ref 8.5–10.0)
CHLORIDE: 107 mmol/L (ref 96–111)
CO2 TOTAL: 22 mmol/L (ref 22–30)
CREATININE: 0.58 mg/dL — ABNORMAL LOW (ref 0.60–1.05)
ESTIMATED GFR: >90 mL/min/BSA (ref >=60)
GLUCOSE: 93 mg/dL (ref 65–125)
POTASSIUM: 3.6 mmol/L (ref 3.5–5.1)
SODIUM: 140 mmol/L (ref 136–145)

## 2020-06-21 LAB — PHOSPHORUS: PHOSPHORUS: 3.9 mg/dL (ref 2.4–4.7)

## 2020-06-21 LAB — HEPATIC FUNCTION PANEL
ALBUMIN: 3.7 g/dL (ref 3.5–5.0)
ALKALINE PHOSPHATASE: 158 U/L — ABNORMAL HIGH (ref 40–110)
ALT (SGPT): 26 U/L — ABNORMAL HIGH (ref 8–22)
AST (SGOT): 11 U/L (ref 8–45)
BILIRUBIN DIRECT: 0.2 mg/dL (ref 0.1–0.4)
BILIRUBIN TOTAL: 0.5 mg/dL (ref 0.3–1.3)
PROTEIN TOTAL: 6.9 g/dL (ref 6.4–8.3)

## 2020-06-21 LAB — THYROXINE, FREE (FREE T4): THYROXINE (T4), FREE: 1.39 ng/dL — ABNORMAL HIGH (ref 0.70–1.25)

## 2020-06-21 LAB — MAGNESIUM: MAGNESIUM: 1.9 mg/dL (ref 1.8–2.6)

## 2020-06-21 MED ORDER — LEVOTHYROXINE 200 MCG TABLET
200.0000 ug | ORAL_TABLET | Freq: Every morning | ORAL | 0 refills | Status: AC
Start: 2020-06-21 — End: ?
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
## 2020-06-21 NOTE — Consults (Signed)
[facility]  Digestive Diseases Consult Follow Up      Kara Barnes, Kara Barnes, 49 y.o. female  Date of Service: 06/21/2020  Date of Birth:  25-Dec-1970    Hospital Day:  LOS: 0 days     Assessment/Recommendations:Kara Barnes is a 49 y.o., White female with past medical history of hypothyroidism, anxiety who presents with symptoms of abdominal pain. She has had bilateral ovarian resection on 06/15/2020 at [facility] which revealed serous cystadenoma bilaterally.     1-Abdominal pain, most likely secondary to the ovarian cyst.  Slight dilatation of common bile duct is a concern.  S/p EUS that showed a Mildly dilated CBD most suggestive of benign papillary stenosis.  No intervention needed.   Vitals are stable, tachycardia, no fever no leukocytosis.  Bilirubin is normal, ALT and ast are normal today  , alk phos is slightly  elevated.  Will sign off, call if needed.        US liver  Hepatomegaly and steatosis     Status post cholecystectomy and prominent common bile duct.      MRCP  1. Liver is at the upper limits of normal for size without significant hepatic steatosis. No discrete liver mass identified on this noncontrast  examination.  2. Mild prominence of the intrahepatic bile ducts and mild enlargement of the extrahepatic bile duct measuring up to 9 mm in diameter. The distal common bile duct tapers to measure 4 mm at the level of the pancreatic  head. No filling defects or obstructing lesion identified on this noncontrast examination. These findings are likely to correlate of expected  physiologic change postcholecystectomy.    Chief Complaint:  Abdominal poin   Subjective: no acute events over night.    Objective:  Temperature: 36.5 C (97.7 F)  Heart Rate: 91  BP (Non-Invasive): (!) 118/59  Respiratory Rate: 16  SpO2: 94 %     General: In no acute distress  HEENT: Atraumatic  Eyes: Conjunctiva non-icteric  Neck: Atraumatic, supple  CV: Regular rate and rhythm on monitor  Pulm: Equal bilateral chest  rise, non labored breathing  Abd: Soft, tender to R/L flanks , nondistended   Extrem: No cyanosis or edema  Skin: Warm and Dry  Neuro: AAO x 3    Labs:    Lab Results Today:    Results for orders placed or performed during the hospital encounter of 06/17/20 (from the past 24 hour(s))   THYROXINE, FREE (FREE T4)   Result Value Ref Range    THYROXINE (T4), FREE 1.54 (H) 0.70 - 1.25 ng/dL   BASIC METABOLIC PANEL, NON-FASTING   Result Value Ref Range    SODIUM 140 136 - 145 mmol/L    POTASSIUM 3.6 3.5 - 5.1 mmol/L    CHLORIDE 107 96 - 111 mmol/L    CO2 TOTAL 22 22 - 30 mmol/L    ANION GAP 11 4 - 13 mmol/L    CALCIUM 10.1 (H) 8.5 - 10.0 mg/dL    GLUCOSE 93 65 - 125 mg/dL    BUN 18 8 - 25 mg/dL    CREATININE 0.58 (L) 0.60 - 1.05 mg/dL    BUN/CREA RATIO 31 (H) 6 - 22    ESTIMATED GFR >90 >=60 mL/min/BSA   MAGNESIUM   Result Value Ref Range    MAGNESIUM 1.9 1.8 - 2.6 mg/dL   PHOSPHORUS   Result Value Ref Range    PHOSPHORUS 3.9 2.4 - 4.7 mg/dL   THYROXINE, FREE (FREE T4)  Result Value Ref Range    THYROXINE (T4), FREE 1.39 (H) 0.70 - 1.25 ng/dL   CBC WITH DIFF   Result Value Ref Range    WBC 9.6 3.7 - 11.0 x10^3/uL    RBC 4.96 3.85 - 5.22 x10^6/uL    HGB 14.3 11.5 - 16.0 g/dL    HCT 42.7 34.8 - 46.0 %    MCV 86.1 78.0 - 100.0 fL    MCH 28.8 26.0 - 32.0 pg    MCHC 33.5 31.0 - 35.5 g/dL    RDW-CV 12.7 11.5 - 15.5 %    PLATELETS 235 150 - 400 x10^3/uL    MPV 9.9 8.7 - 12.5 fL    NEUTROPHIL % 50 %    LYMPHOCYTE % 40 %    MONOCYTE % 7 %    EOSINOPHIL % 2 %    BASOPHIL % 0 %    NEUTROPHIL # 4.80 1.50 - 7.70 x10^3/uL    LYMPHOCYTE # 3.81 1.00 - 4.80 x10^3/uL    MONOCYTE # 0.71 0.20 - 1.10 x10^3/uL    EOSINOPHIL # 0.17 <=0.50 x10^3/uL    BASOPHIL # <0.10 <=0.20 x10^3/uL    IMMATURE GRANULOCYTE % 1 0 - 1 %    IMMATURE GRANULOCYTE # 0.12 (H) <0.10 x10^3/uL   HEPATIC FUNCTION PANEL   Result Value Ref Range    ALBUMIN 3.7 3.5 - 5.0 g/dL     ALKALINE PHOSPHATASE 158 (H) 40 - 110 U/L    ALT (SGPT) 26 (H) 8 - 22 U/L    AST (SGOT)  11 8 -  45 U/L    BILIRUBIN TOTAL 0.5 0.3 - 1.3 mg/dL    BILIRUBIN DIRECT 0.2 0.1 - 0.4 mg/dL    PROTEIN TOTAL 6.9 6.4 - 8.3 g/dL       Imaging Studies:  N/A    Vivien Rota, MD      06/21/2020  I saw and examined the patient.  I reviewed the fellow's note.  I agree with the findings and plan of care as documented in the fellow's note.  Any exceptions/additions are edited/noted.    Nelle Don, MD

## 2020-06-21 NOTE — Transitional Care (Addendum)
[facility] Medicine   Transitional Care Coordination   Initial Assessment       Name: Kara Barnes   Date of Birth: Jun 09, 1971 49 y.o.  Date of service: 06/21/2020     Attempted to speak with patient. No answer at bedside phone. Left message at mobile number requesting return call.    Service requesting assistance for patient to locate free clinic in/near [place], [place]. [facility]. Phone: [phone]  Address: [place], [place] [address]. Clinic open for primary care every Wednesday at 5:00 PM and 1st Tuesday of the month at 5 PM per website. Walk in clinic only per automated phone system. Service notified. Information added to AVS.    Sharen Hint, [name], 06/21/2020  Ext. [phone]

## 2020-06-21 NOTE — Discharge Instructions (Signed)
Cannot lift anything above 10lbs until cleared by surgeon following laparoscopic salpingo-oophorectomy on 8/11  Patient reported doing well on Zyprexa for anxiety control inpatient, said would consider after outpatient evaluation

## 2020-06-21 NOTE — Care Plan (Signed)
Problem: Depression  Goal: Improved Mood  Outcome: Ongoing (see interventions/notes)     Problem: Pain Acute  Goal: Acceptable Pain Control and Functional Ability  Outcome: Ongoing (see interventions/notes)  Note: Take Tylenol po prn for pain. Moves around room ad lib. Can do all ADLS herself. Call bell within reach . MAy discharge soon all 3 dermabond incision healed well OTA bruising present.
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
## 2020-06-21 NOTE — Nurses Notes (Addendum)
Patient discharged to [facility] via bus.  AVS reviewed with patient.  A written copy of the AVS and discharge instructions was given to the patient.  Questions sufficiently answered as needed.  Patient encouraged to follow up with PCP as indicated.  In the event of an emergency, patient/care giver instructed to call 911 or go to the nearest emergency room.     1511: scripts signed and gave to pt. Pt opted to walk out. No other needs during this time.   Geralynn Ochs, RN  06/21/2020, 15:12

## 2020-06-21 NOTE — Progress Notes (Signed)
[facility]  Medicine Progress Note  Full Code    Kara Barnes  Date of service: 06/21/2020    Subjective: Patient sleeping on exam this morning. She is arousable however she is not inclined to participate in interview, after much discussion. We have reassessed patient on continual basis. She is unclear where she is going to receive her medications, and she has limited resources for transportation and affording medications.     Vital Signs:  Temp (24hrs) Max:36.7 C (02.6 F)      Systolic (37CHY), IFO:277 , Min:115 , AJO:878     Diastolic (67EHM), CNO:70, Min:56, Max:82    Temp  Avg: 36.4 C (97.5 F)  Min: 36 C (96.8 F)  Max: 36.7 C (98.1 F)  MAP (Non-Invasive)  Avg: 82 mmHG  Min: 73 mmHG  Max: 96 mmHG  Pulse  Avg: 94.3  Min: 76  Max: 118  Resp  Avg: 17.1  Min: 15  Max: 24  SpO2  Avg: 94.7 %  Min: 93 %  Max: 97 %     Fi02    I/O:  I/O last 24 hours:      Intake/Output Summary (Last 24 hours) at 06/21/2020 1310  Last data filed at 06/21/2020 1032  Gross per 24 hour   Intake 1740 ml   Output -   Net 1740 ml     I/O current shift:  08/17 0700 - 08/17 1859  In: 240 [P.O.:240]  Out: -     acetaminophen (TYLENOL) tablet, 650 mg, Oral, Q4H PRN  albuterol (PROVENTIL) 2.5 mg / 3 mL (0.083%) neb solution, 2.5 mg, Nebulization, Q2H PRN  ALPRAZolam (XANAX) tablet, 1 mg, Oral, 3x/day  enoxaparin PF (LOVENOX) 40 mg/0.4 mL SubQ injection, 40 mg, Subcutaneous, Q24H  escitalopram (LEXAPRO) tablet, 20 mg, Oral, Daily  famotidine (PEPCID) tablet, 20 mg, Oral, 2x/day  lidocaine-menthol (LIDOPATCH) 3.6%-1.25% patch, 1 Patch, Transdermal, Daily  nicotine (NICODERM CQ) transdermal patch (mg/24 hr), 42 mg, Transdermal, Daily  nicotine polacrilex (COMMIT) lozenge, 2 mg, Oral, Q1H PRN  nortriptyline (PAMELOR) capsule, 10 mg, Oral, NIGHTLY  NS flush syringe, 2-6 mL, Intracatheter, Q8HRS   And  NS flush syringe, 2-6 mL, Intracatheter, Q1 MIN PRN  polyethylene glycol (MIRALAX) oral packet, 17 g, Oral, Daily  sennosides-docusate  sodium (SENOKOT-S) 8.6-50mg  per tablet, 1 Tablet, Oral, 2x/day        Allergies   Allergen Reactions   . Penicillins Rash   . Ultram [Tramadol] Rash   . Flagyl [Metronidazole]  Other Adverse Reaction (Add comment)     Migraine      . Flexeril [Cyclobenzaprine]  Other Adverse Reaction (Add comment)     Migraine     . Toradol [Ketorolac]  Other Adverse Reaction (Add comment)     Migraine         Physical Exam:  Gen: No distress  Abd: Soft, non tender, non distended BS + Laparoscopic incisions well approximated and well healing with minimal bruising. Dermabonded.  Cardiac: RRR, no murmurs.  Respiratory: CTAB  Psych: Poor eye contact, does have minimal participation in the interview. Labile moods.  Extremities: no edema    Labs:  I have reviewed laboratory results from this admission  Free T4 still elevated.   Normal renal function.   Normal WBC. No anemia.   PTH elevated though Vit D deficient.  No hyperbilirubinemia  ALT improved to 26.    Radiology:     Microbiology:  Hepatitis A/B/C negative    Consults: GI  Hardware (lines, foley's, tubes):  PIV    Assessment/ Plan:   Active Hospital Problems    Diagnosis   . Primary Problem: Dilation of common bile duct       49 year old female transferred here for common bile duct dilatation status post cholecystectomy, after hospitalization outside facility for abdominal pain found to have significant ovarian cyst, status post laporascopic BSO 8/11, pathology showing serous cystadenoma.  She does have a history of cervical dysplasia, with recent pap normal cytopath though HPV positive.  Other notable past medical history includes mood disorder, history of Graves disease status post RAI with subsequent hypothyroidism with supratherapeutic levels currently, we are holding home synthroid with plans to dose reduce to 200 mcg daily. Patient medical stable for discharge.     CBD dilatation: EUS unrevealing. Likely from s/p cholecystectomy. ALT improved to normal limits, may be  secondary to DILI from supratherapeutic synthroid.    Recent laparoscopic BSO, with removal of ovarian cyst, pathology confirms serous cystadenoma:  Continue Tylenol as needed for pain. Activity restrictions for 6 weeks, incisions well approximated. I updated patient on benign pathology as well as normal pap smear results today., but also HPV positivity and need for close follow up.    History of mood disorder:  Overall unclear diagnosis, continue home Lexapro 20 mg daily, nicotine replacement for tobacco use disorder, nortriptyline 10 mg q.h.s. For insomnia.  Patient reports that she has a history of panic disorder and was previously on 2 mg Xanax t.i.d and was confirmed via patient's pharmacy in [place]. However this was decreased due to concern for drug interaction, as well as noted somnolence while hospitalized, as well as for potential harm as patient is unclear where she will receive outpatient services at this time.    History of Graves disease, status post RAI, now with Hypothyroidism:  Continue to hold home Synthroid, will decrease to 200 mcg daily (previously on 300 mcg daily) dosing and resume as T4 levels return to normal.      DVT/PE Prophylaxis: Enoxaparin    Disposition Planning: Home discharge  Social work contacted for assistance for discharge needs and transportation. See Care Management notes.    Arlie Solomons, MD

## 2020-06-21 NOTE — Progress Notes (Signed)
[facility]  INTERNAL MEDICINE  Progress Note     Name: Kara Barnes, Kara Barnes, 49 y.o. female Date of Service: 06/21/2020   NTZ:[id number] LOS: 0   PCP:Pcp Not In System Attending: Arlie Barnes, *   Code Status: Full Code Admitted for: Dilation of common bile duct     SUBJECTIVE:      Patient had no over night acute events. Difficult to awake on morning exam as has been the last few mornings but was more alert later in the day.     OBJECTIVE:    EXAMINATION:  Temperature: 36.5 C (97.7 F) BP (Non-Invasive): (!) 118/59 Heart Rate: 91   Respiratory Rate: 16 SpO2: 94 % Weight: 64.7 kg (142 lb 10.2 oz)          Intake/Output Summary (Last 24 hours) at 06/21/2020 0836  Last data filed at 06/21/2020 0415  Gross per 24 hour   Intake 1500 ml   Output    Net 1500 ml     Last Bowel Movement: 06/20/20     General: Well-nourished, well-developed, in NAD.  HEENT: NC/AT, EOMI, Oropharynx clear, mucous membranes moist, Trachea midline.  Cardiovascular: RRR, no murmurs, rubs, gallops.  Respiratory: CTABL  GI: Bowel sounds normal; abdomen soft, non-tender to palpation  Extremities/Skin: No edema. Warm and dry.  Neurological: Awake, A&O x 3.     LABS:    CBC Differential   Recent Labs     06/19/20  0352 06/20/20  0432 06/21/20  0339   WBC 10.6 10.3 9.6   HGB 14.5 13.5 14.3   HCT 43.6 41.0 42.7   PLTCNT 263 225 235    Recent Labs     06/19/20  0352 06/19/20  0352 06/20/20  0432 06/21/20  0339   PMNS 54  --  57 50   MONOCYTES 7  --  7 7   BASOPHILS 1  <0.10   < > 0  <0.10 0  <0.10   PMNABS 5.90  --  5.88 4.80   LYMPHSABS 3.77  --  3.35 3.81   MONOSABS 0.69  --  0.71 0.71   EOSABS 0.10  --  0.17 0.17    < > = values in this interval not displayed.      BMP LFTs   Recent Labs     06/21/20  0339   SODIUM 140   POTASSIUM 3.6   CHLORIDE 107   CO2 22   BUN 18   CREATININE 0.58*   GLUCOSENF 93   ANIONGAP 11   BUNCRRATIO 31*   GFR >90   CALCIUM 10.1*   MAGNESIUM 1.9   PHOSPHORUS 3.9   ALBUMIN 3.7    Recent Labs     06/21/20  0339    TOTALPROTEIN 6.9   ALBUMIN 3.7   TOTBILIRUBIN 0.5   BILIRUBINCON 0.2   AST 11   ALT 26*   ALKPHOS 158*      CoAgs Blood Gas:   No results found for this encounter No results found for this encounter    Cardiac Markers Lipid Panel   Recent Labs     06/19/20  2020   TROPONINI <7    No results found for this encounter     IMAGING STUDIES:    Results for orders placed or performed during the hospital encounter of 06/17/20 (from the past 24 hour(s))   EUS PROCEDURE     Status: None    Narrative    *Procedure not read by  radiology.  *Refer to procedure note for result.   EUS PROCEDURE     Status: None    Narrative    *Procedure not read by radiology.  *Refer to procedure note for result.       PATHOLOGY & MICROBIOLOGY:    No results found for any visits on 06/17/20 (from the past 24 hour(s)).    INPATIENT MEDICATIONS:  acetaminophen (TYLENOL) tablet, 650 mg, Oral, Q4H PRN  albuterol (PROVENTIL) 2.5 mg / 3 mL (0.083%) neb solution, 2.5 mg, Nebulization, Q2H PRN  ALPRAZolam (XANAX) tablet, 1 mg, Oral, 3x/day  enoxaparin PF (LOVENOX) 40 mg/0.4 mL SubQ injection, 40 mg, Subcutaneous, Q24H  escitalopram (LEXAPRO) tablet, 20 mg, Oral, Daily  famotidine (PEPCID) tablet, 20 mg, Oral, 2x/day  lidocaine-menthol (LIDOPATCH) 3.6%-1.25% patch, 1 Patch, Transdermal, Daily  nicotine (NICODERM CQ) transdermal patch (mg/24 hr), 42 mg, Transdermal, Daily  nicotine polacrilex (COMMIT) lozenge, 2 mg, Oral, Q1H PRN  nortriptyline (PAMELOR) capsule, 10 mg, Oral, NIGHTLY  NS flush syringe, 2-6 mL, Intracatheter, Q8HRS   And  NS flush syringe, 2-6 mL, Intracatheter, Q1 MIN PRN  polyethylene glycol (MIRALAX) oral packet, 17 g, Oral, Daily  sennosides-docusate sodium (SENOKOT-S) 8.6-50mg  per tablet, 1 Tablet, Oral, 2x/day         ASSESSMENT:     Kara Barnes is a 49 y.o. female with PMH of multiple mood disorders, COPD, CVA, HTN, and thyroid disease, who presented to outside hospital with back pain. She was admitted for further work up of CBD  dilation.     Active Hospital Problems    Diagnosis    Primary Problem: Dilation of common bile duct        PLAN:   CBD dilation  s/p CCY  Shown on RUQ u/s (17mm) which recommended MRCP  MRCP WNL, gastroenterology consulted, planning EUS 8/16  Tolerating PO intake at outside hospital     Transaminitis  Hepatomegaly  HIV: negative   Hepatitis panel: Hep A negative and not-immune, Hep B surface Ag neg, Hep C equivocal x 2  Hep C PCR negative  RUQ u/s - CBD measures 11 mm, (+) hepatomegaly and steatosis     Serous cystadenoma Bilateral  S/P b/l ovarian resection 06/15/20  Thin prep pending    - Chronic Medical Conditions Managed During This Admission -   Anxiety - Xanax 1 mg TID, GI cocktail PRN and lidocaine patch added.  Migraines - Continue Nortriptyline 10 mg qhs  Depression - Continue home Lexapro 20 mg  Iatrogenic Hypothyroidism - TSH undetectable, trending free T4 daily, hold levothyroxine until discharge  Tobacco Use Disorder  - Nicotine Replacement ordered  ___    DVT/PE Prophylaxis - Enoxaparin  Consults - Gastroenterology  Hardware (Lines, Drains, Foley, Tubes) - PIV  Diet - Regular  Activity - Ambulate w/ assitance  Therapy - None    ANTICIPATED DISPOSITION PLANNING   Needs -  Transportation back to [employer]. Working with Care Management.  Location - [place], [place] - 06/21/2020  [facility]    This note to be used for teaching purposes only.

## 2020-06-22 ENCOUNTER — Telehealth (HOSPITAL_COMMUNITY): Payer: Self-pay

## 2020-06-22 LAB — DRUG MONITOR, BENZO, QN, URINE
ALPHAHYDROXYALPRAZOLAM: 460 ng/mL — ABNORMAL HIGH (ref <25)
ALPHAHYDROXYMIDAZOLAM: NEGATIVE ng/mL (ref <50)
ALPHAHYDROXYTRIAZOLAM: NEGATIVE ng/mL (ref <50)
AMINOCLONAZEPAM: NEGATIVE ng/mL (ref <25)
HYDROXYETHYLFLURAZEPAM: NEGATIVE ng/mL (ref <50)
LORAZEPAM: NEGATIVE ng/mL (ref <50)
NORDIAZEPAM: NEGATIVE ng/mL (ref <50)
OXAZEPAM: NEGATIVE ng/mL (ref <50)
TEMAZEPAM: NEGATIVE ng/mL (ref <50)

## 2020-06-22 LAB — NOTES AND COMMENTS

## 2020-06-22 NOTE — Telephone Encounter (Signed)
Transition of Care Contact  Discharge Date: 06/21/2020  Transition Facility Type--Hospital (Inpatient or Observation)  [facility]  Interactive Contact(s): Completed or attempted contact indicated by Date/Time  Completed Contact: 06/22/2020  1:08 PM  First Attempt Call: 06/22/2020  8:08 AM  Contact Method(s)-- Patient/Caregiver Telephone  Clinical Staff Name/Role who Suzanna Obey, RN  Transition Assessment  Discharge Summary obtained?--Yes  How are you recovering?  Discharge Meds obtained?  Medications reconcilled with Discharge Documentation?  Medication Concerns?  Have everything needed for recovery?  Care Coordination:   Patient has transition follow-up appointment date and time?--Yes  Primary Care Transition Visit planned?  Specialist Transition Visit planned?  Patient/caregiver plans to attend transition visit?  Primary [place] or DME ordered at discharge?--No  Clinician/Team notified?--No  Primary reason clinician notified?  Transition Note:   Attempted hospital discharge FU call- no answer at this time, left detailed VM with my contact number requesting call back. Patient given instructions to FU with [facility] in 1 week with dates and times for clinic.Clinic is a walk in clinic. Patient also given by CM per notes 3 bus tickets and instructions to the [facility]. Will attempt to schedule patient FU with the TCC if able to reach patient and she is agreeable to appointment. Patient does not have a PCP at this time, will need assistance at FU to secure one. Patient homeless but has been staying with Barbaraann Rondo and his wife in [place] per notes. Also per notes, patient has out of state MCD ([place])- unclear if she has applied for [facility]. Will attempt second patient contact later today.  Richardson Dopp, RN  06/22/2020, 08:06  Unable to reach patient by phone and no call back received. Patient does not have an active MyChart. Unable to  schedule TCC FU at this time. Will schedule if call back received and patient is agreeable to appointment.  Richardson Dopp, RN  06/22/2020, 13:07     Further information may be documented in relevant telephone or outreach encounter.

## 2020-06-23 LAB — HPV GENOTYPING - BMC/JMC ONLY
HPV 18/45 PCR: NEGATIVE
HPV16 PCR: NEGATIVE

## 2020-06-28 LAB — CYTOPATHOLOGY, GYN +/- HIGH RISK HPV

## 2020-08-30 MED FILL — succinylcholine chloride 20 mg/mL injection solution: INTRAMUSCULAR | Qty: 10 | Status: AC

## 2020-09-17 LAB — ECG 12-LEAD
Atrial Rate: 82 {beats}/min
Calculated P Axis: 54 degrees
Calculated R Axis: 54 degrees
Calculated T Axis: 59 degrees
PR Interval: 160 ms
QRS Duration: 80 ms
QT Interval: 374 ms
QTC Calculation: 436 ms
Ventricular rate: 82 {beats}/min

## 2021-03-07 DIAGNOSIS — I700 Atherosclerosis of aorta: Secondary | ICD-10-CM | POA: Insufficient documentation

## 2021-03-09 ENCOUNTER — Other Ambulatory Visit: Payer: Self-pay | Admitting: Physician Assistant

## 2021-03-09 DIAGNOSIS — R519 Headache, unspecified: Secondary | ICD-10-CM

## 2021-03-21 ENCOUNTER — Ambulatory Visit
Admission: RE | Admit: 2021-03-21 | Discharge: 2021-03-21 | Disposition: A | Discharge status: Home / Self Care | Payer: Medicaid Other | Source: Ambulatory Visit | Attending: Physician Assistant | Admitting: Physician Assistant

## 2021-03-21 ENCOUNTER — Other Ambulatory Visit: Payer: Self-pay

## 2021-03-21 DIAGNOSIS — R519 Headache, unspecified: Secondary | ICD-10-CM | POA: Diagnosis present

## 2021-03-21 MED ORDER — GADOBUTROL 1 MMOL/ML IV SOLN
6.0000 mL | Freq: Once | INTRAVENOUS | Status: AC | PRN
  Administered 2021-03-21: 6 mL via INTRAVENOUS

## 2021-04-13 ENCOUNTER — Ambulatory Visit: Payer: Self-pay

## 2021-04-14 ENCOUNTER — Encounter: Payer: Self-pay | Admitting: Emergency Medicine

## 2021-04-14 ENCOUNTER — Other Ambulatory Visit: Payer: Self-pay

## 2021-04-14 ENCOUNTER — Ambulatory Visit
Admission: EM | Admit: 2021-04-14 | Discharge: 2021-04-14 | Disposition: A | Discharge status: Home / Self Care | Payer: Medicaid Other

## 2021-04-14 ENCOUNTER — Ambulatory Visit (INDEPENDENT_AMBULATORY_CARE_PROVIDER_SITE_OTHER): Payer: Medicaid Other

## 2021-04-14 DIAGNOSIS — S301XXA Contusion of abdominal wall, initial encounter: Secondary | ICD-10-CM | POA: Diagnosis not present

## 2021-04-14 DIAGNOSIS — R0602 Shortness of breath: Secondary | ICD-10-CM

## 2021-04-14 DIAGNOSIS — J441 Chronic obstructive pulmonary disease with (acute) exacerbation: Secondary | ICD-10-CM | POA: Diagnosis not present

## 2021-04-14 MED ORDER — PREDNISONE 20 MG PO TABS: 40.0000 mg | ORAL_TABLET | Freq: Every day | ORAL | 0 refills | Status: AC

## 2021-04-14 MED ORDER — DOXYCYCLINE HYCLATE 100 MG PO CAPS: 100.0000 mg | ORAL_CAPSULE | Freq: Two times a day (BID) | ORAL | 0 refills | Status: AC

## 2021-04-14 MED ORDER — HYDROCOD POLST-CPM POLST ER 10-8 MG/5ML PO SUER: 5.0000 mL | Freq: Every evening | ORAL | 0 refills | Status: DC | PRN

## 2021-04-14 NOTE — ED Provider Notes (Signed)
[facility]    CSN: [id number] Arrival date & time: 04/14/21  0831      History   Chief Complaint Chief Complaint  Patient presents with   Fall   Shortness of Breath   Abdominal Pain    HPI Michelle Conley is a 50 y.o. female with multiple complaints.  Patient says that over the past week she has had right lower abdominal pain.  She says that she fell and hit her lower abdomen on a metal table on accident.  This occurred 1 week ago.  Patient says she has swelling and bruising in this area.  Patient says the bruising and swelling have improved and the pain is not as bad.  She says she has taken over-the-counter ibuprofen and Tylenol which do not seem to help though.  She denies any blood in urine or stool, urinary complaints, nausea or vomiting.  She has applied ice as well.  Additionally patient reports that she has had increased shortness of breath over the past 3 months.  Patient does have history of COPD and aortic valve insufficiency. She has seen a cardiologist about this last month and has orders for EKG, cardiac stress test and PFTs.  Patient's next follow-up with cardiologist is in less than 2 weeks.  She says she still awaiting appointment with the pulmonologist.  Patient says she uses multiple inhalers including Spiriva, Symbicort, albuterol, and Qvar.  Patient continues to smoke.  Says it is very difficult to quit smoking.  She admits to a lot of anxiety and depression and says that smoking helps calm her.  Patient does take Lexapro and a benzodiazepine and says she has a therapist.  Patient says that her shortness of breath is mostly whenever she gets up and walks a distance.  Patient says when she walks into the building from her car she feels short of breath and that is different for her.  She denies any associated chest pain or leg swelling.  No palpitations or weakness.  Patient says she has a chronic cough that is especially bad at night.  Admits to getting choked up  at night with the cough.  It is often productive of mucus.  No significant worsening of symptoms in the past few weeks to a month.  No exposure to COVID-19.  No fevers.  No other complaints or concerns.  HPI  Past Medical History:  Diagnosis Date   Abnormal chest xray    Reports that she has spots on her lungs   Anemia    Anxiety    Aortic valve disorder    Aortic valve insufficiency    Asthma    Cervical dysplasia    Complication of anesthesia    reports that she comes out of it slowly   Cranial nerve palsy    Depression    GERD (gastroesophageal reflux disease)    Goiter    History of kidney stones    History of meningitis    History of viral encephalitis    Hypertension    Hypothyroidism    Migraine headache    Post traumatic stress disorder (PTSD)    Scarlet fever    Shortness of breath dyspnea    Stroke (HCC)    Syncope and collapse     There are no problems to display for this patient.   Past Surgical History:  Procedure Laterality Date   ABDOMINAL SURGERY     CESAREAN SECTION  11/06/1995   CHOLECYSTECTOMY  11/05/2013  LINGUAL FRENECTOMY N/A 06/20/2015   Procedure: UPPER LABIAL L FRENECTOMY;  Surgeon: Lincoln Brigham, DDS;  Location: [facility] OR;  Service: Oral Surgery;  Laterality: N/A;   MULTIPLE EXTRACTIONS WITH ALVEOLOPLASTY N/A 06/20/2015   Procedure: EXTRACTION  OF TEETH NUMBERS 2-15, 19-30,ALVEOLOPLASTY OF MAXILLA  AND MANDIBLE;  Surgeon: Lincoln Brigham, DDS;  Location: [facility] OR;  Service: Oral Surgery;  Laterality: N/A;   NASAL SEPTUM SURGERY      OB History   No obstetric history on file.      Home Medications    Prior to Admission medications   Medication Sig Start Date End Date Taking? Authorizing Provider  albuterol (PROVENTIL HFA;VENTOLIN HFA) 108 (90 Base) MCG/ACT inhaler Inhale 2 puffs into the lungs every 6 (six) hours as needed for wheezing or shortness of breath. 01/05/19  Yes Darci Current, MD  alprazolam Prudy Feeler) 2 MG tablet Take 2  mg by mouth 4 (four) times daily.   Yes [provider]  beclomethasone (QVAR) 80 MCG/ACT inhaler Inhale 2 puffs into the lungs every 6 (six) hours as needed (wheezing or shortness of breath).   Yes [provider]  chlorpheniramine-HYDROcodone (TUSSIONEX PENNKINETIC ER) 10-8 MG/5ML SUER Take 5 mLs by mouth at bedtime as needed for cough. 04/14/21  Yes Eusebio Friendly B, PA-C  doxycycline (VIBRAMYCIN) 100 MG capsule Take 1 capsule (100 mg total) by mouth 2 (two) times daily for 5 days. 04/14/21 04/19/21 Yes Eusebio Friendly B, PA-C  escitalopram (LEXAPRO) 20 MG tablet Take 20 mg by mouth daily.   Yes [provider]  levothyroxine (SYNTHROID, LEVOTHROID) 300 MCG tablet Take 300 mcg by mouth daily.   Yes [provider]  methocarbamol (ROBAXIN) 750 MG tablet Take by mouth.   Yes [provider]  nortriptyline (PAMELOR) 25 MG capsule Take 25 mg by mouth daily. 03/16/21  Yes [provider]  predniSONE (DELTASONE) 20 MG tablet Take 2 tablets (40 mg total) by mouth daily for 5 days. 04/14/21 04/19/21 Yes Eusebio Friendly B, PA-C  tiotropium (SPIRIVA) 18 MCG inhalation capsule Place 18 mcg into inhaler and inhale daily.    Yes [provider]  albuterol (PROVENTIL HFA;VENTOLIN HFA) 108 (90 Base) MCG/ACT inhaler Inhale 1 puff into the lungs every 6 (six) hours as needed for wheezing or shortness of breath. 01/27/18   Bridget Hartshorn L, PA-C  atorvastatin (LIPITOR) 20 MG tablet Take by mouth.    [provider]  EPINEPHrine (EPIPEN 2-PAK) 0.3 mg/0.3 mL IJ SOAJ injection Inject 0.3 mg into the muscle once as needed (allergic reaction).    [provider]  ibuprofen (ADVIL,MOTRIN) 800 MG tablet Take 800 mg by mouth every 8 (eight) hours as needed for mild pain or moderate pain.    [provider]  omeprazole (PRILOSEC) 40 MG capsule Take 40 mg by mouth 2 (two) times daily.    [provider]  promethazine (PHENERGAN) 12.5 MG  tablet Take 1 tablet (12.5 mg total) by mouth every 6 (six) hours as needed for nausea or vomiting. 11/24/16   Willy Eddy, MD  simvastatin (ZOCOR) 20 MG tablet Take 20 mg by mouth every evening.    [provider]  traZODone (DESYREL) 100 MG tablet Take 100 mg by mouth at bedtime as needed for sleep.    [provider]    Family History History reviewed. No pertinent family history.  Social History Social History   Tobacco Use   Smoking status: Every Day    Packs/day: 1.00  Years: 30.00    Pack years: 30.00    Types: Cigarettes   Smokeless tobacco: Never  Substance Use Topics   Alcohol use: No   Drug use: Yes    Types: Marijuana     Allergies   Cyclobenzaprine, Esomeprazole magnesium, Metronidazole, Mobic [meloxicam], Rofecoxib, Toradol [ketorolac tromethamine], Tramadol, and Penicillins   Review of Systems Review of Systems  Constitutional:  Positive for fatigue. Negative for fever.  HENT:  Positive for congestion.   Respiratory:  Positive for cough and shortness of breath.   Cardiovascular:  Negative for chest pain, palpitations and leg swelling.  Gastrointestinal:  Positive for abdominal pain. Negative for blood in stool, nausea and vomiting.  Genitourinary:  Negative for dysuria and hematuria.  Musculoskeletal:  Negative for back pain.  Skin:  Positive for color change.  Neurological:  Negative for weakness.    Physical Exam Triage Vital Signs ED Triage Vitals  Enc Vitals Group     BP 04/14/21 0845 123/71     Pulse Rate 04/14/21 0845 100     Resp 04/14/21 0845 16     Temp 04/14/21 0845 97.9 F (36.6 C)     Temp Source 04/14/21 0845 Oral     SpO2 04/14/21 0845 94 %     Weight 04/14/21 0840 140 lb (63.5 kg)     Height 04/14/21 0840  (1.499 m)     Head Circumference --      Peak Flow --      Pain Score 04/14/21 0840 7     Pain Loc --      Pain Edu? --      Excl. in GC? --    No data found.  Updated Vital Signs BP 123/71  (BP Location: Right Arm)   Pulse 100   Temp 97.9 F (36.6 C) (Oral)   Resp 16   Ht  (1.499 m)   Wt 140 lb (63.5 kg)   LMP 06/02/2015   SpO2 94%   BMI 28.28 kg/m       Physical Exam Vitals and nursing note reviewed.  Constitutional:      General: She is not in acute distress.    Appearance: Normal appearance. She is well-developed. She is not ill-appearing or toxic-appearing.  HENT:     Head: Normocephalic and atraumatic.     Nose: Nose normal.     Mouth/Throat:     Mouth: Mucous membranes are moist.     Pharynx: Oropharynx is clear.  Eyes:     General: No scleral icterus.       Right eye: No discharge.        Left eye: No discharge.     Conjunctiva/sclera: Conjunctivae normal.  Cardiovascular:     Rate and Rhythm: Normal rate and regular rhythm.     Heart sounds: Normal heart sounds.  Pulmonary:     Effort: Pulmonary effort is normal. No respiratory distress.     Breath sounds: Wheezing (few scattered wheezes throughout) present.  Abdominal:     Palpations: Abdomen is soft.     Tenderness: There is abdominal tenderness (TTP localized to area of bruising. There is a large area of ecchymosis of right lower abdomen--yellowish in coloration now).  Musculoskeletal:     Cervical back: Neck supple.  Skin:    General: Skin is warm and dry.  Neurological:     General: No focal deficit present.     Mental Status: She is alert. Mental status is at baseline.  Motor: No weakness.     Gait: Gait normal.  Psychiatric:        Mood and Affect: Mood normal.        Behavior: Behavior normal.        Thought Content: Thought content normal.     UC Treatments / Results  Labs (all labs ordered are listed, but only abnormal results are displayed) Labs Reviewed - No data to display  EKG   Radiology DG Chest 2 View  Result Date: 04/14/2021 CLINICAL DATA:  Shortness of breath. EXAM: CHEST - 2 VIEW COMPARISON:  January 04, 2019. FINDINGS: The heart size and mediastinal  contours are within normal limits. Both lungs are clear. The visualized skeletal structures are unremarkable. IMPRESSION: No active cardiopulmonary disease. Electronically Signed   By: Lupita Raider M.D.   On: 04/14/2021 09:22    Procedures Procedures (including critical care time)  Medications Ordered in UC Medications - No data to display  Initial Impression / Assessment and Plan / UC Course  I have reviewed the triage vital signs and the nursing notes.  Pertinent labs & imaging results that were available during my care of the patient were reviewed by me and considered in my medical decision making (see chart for details).  50 year old female presenting for multiple complaints.  First she says that she has a bruise of her right lower abdomen that has been present for a week following an accidental fall over a metal table.  On exam she does have a healing contusion.  This area is still tender.  She denies any red flag signs or symptoms or abdominal complaints other than tenderness in this area.  Advised how to care for contusion including cryotherapy and also application of heat.  Tylenol and ibuprofen as needed for discomfort.  Patient also complaining of shortness of breath over the past 3 months.  She does have history of COPD, asthma, tobacco abuse, and aortic valve insufficiency.  Patient has multiple specialist including cardiologist and is awaiting referral to pulmonology.  Was able to review patient's office visit notes from last month her cardiologist.  Patient had recent echo and EKG.  She is supposed to have an upcoming stress test and PFTs.  Patient takes multiple inhalers.  Oxygen today is stable at 94%.  She has a few wheezes throughout.  Chest x-ray obtained to assess for possible pneumonia.  Chest x-ray is within normal limits.  Advised patient that I can treat her for COPD exacerbation with prednisone and doxycycline.  Advised her to continue with her inhalers and follow-up with  her specialist.  Patient was requesting for cough, especially at night.  Says she is tried all the over-the-counter medications without relief.  Patient requesting Tussionex.  Reviewed that it is not safe or advised to take this medication long-term.  Advised she can take it short term at night as needed for cough.  Advised that is not something to provide refills on.  Reviewed ED red flag signs and symptoms related to shortness of breath or patient.   Final Clinical Impressions(s) / UC Diagnoses   Final diagnoses:  COPD exacerbation (HCC)  Shortness of breath  Contusion of abdominal wall, initial encounter     Discharge Instructions      Your chest x-ray does not show any pneumonia.  You need to keep follow-up with your cardiologist, PCP and pulmonology appointment that should be upcoming.  We will treat you for COPD exacerbation at this time.  Have sent in  steroids and an antibiotic.  Increase rest and fluids.  Take Mucinex and drink tons of water during the day.  You can use the prescription cough medication at bedtime if needed but this should not be used long-term.  You should go to the ED if your symptoms acutely worsen before you can see your specialists.  Continue to use your inhalers.  Try to apply heating pad to the area of contusion.  This should help the blood reabsorb.  You can take Tylenol for pain.  Can also take ibuprofen.     ED Prescriptions     Medication Sig Dispense Auth. Provider   chlorpheniramine-HYDROcodone (TUSSIONEX PENNKINETIC ER) 10-8 MG/5ML SUER Take 5 mLs by mouth at bedtime as needed for cough. 100 mL Eusebio Friendly B, PA-C   doxycycline (VIBRAMYCIN) 100 MG capsule Take 1 capsule (100 mg total) by mouth 2 (two) times daily for 5 days. 10 capsule Eusebio Friendly B, PA-C   predniSONE (DELTASONE) 20 MG tablet Take 2 tablets (40 mg total) by mouth daily for 5 days. 10 tablet Shirlee Latch, PA-C      I have reviewed the PDMP during this encounter.   Shirlee Latch, PA-C 04/14/21 [phone]

## 2021-04-14 NOTE — ED Triage Notes (Signed)
Patient states that she fell and hit the right lower abdomen on a metal table a week ago.  Patient reports ongoing pain, swelling and bruising in her abdomen.  Patient also reports SOB that has gotten worse over the past 3 months.  Patient states that she has COPD.  Patient denies fevers.

## 2021-04-14 NOTE — Discharge Instructions (Addendum)
Your chest x-ray does not show any pneumonia.  You need to keep follow-up with your cardiologist, PCP and pulmonology appointment that should be upcoming.  We will treat you for COPD exacerbation at this time.  Have sent in steroids and an antibiotic.  Increase rest and fluids.  Take Mucinex and drink tons of water during the day.  You can use the prescription cough medication at bedtime if needed but this should not be used long-term.  You should go to the ED if your symptoms acutely worsen before you can see your specialists.  Continue to use your inhalers.  Try to apply heating pad to the area of contusion.  This should help the blood reabsorb.  You can take Tylenol for pain.  Can also take ibuprofen.

## 2021-05-19 ENCOUNTER — Other Ambulatory Visit: Payer: Self-pay

## 2021-05-19 ENCOUNTER — Ambulatory Visit: Payer: Self-pay

## 2021-05-20 DIAGNOSIS — F419 Anxiety disorder, unspecified: Secondary | ICD-10-CM | POA: Insufficient documentation

## 2021-05-20 DIAGNOSIS — F17200 Nicotine dependence, unspecified, uncomplicated: Secondary | ICD-10-CM | POA: Insufficient documentation

## 2021-05-20 DIAGNOSIS — E039 Hypothyroidism, unspecified: Secondary | ICD-10-CM | POA: Insufficient documentation

## 2021-05-21 DIAGNOSIS — I340 Nonrheumatic mitral (valve) insufficiency: Secondary | ICD-10-CM | POA: Insufficient documentation

## 2021-06-21 ENCOUNTER — Ambulatory Visit (INDEPENDENT_AMBULATORY_CARE_PROVIDER_SITE_OTHER): Payer: Medicaid Other

## 2021-06-21 ENCOUNTER — Encounter: Payer: Self-pay | Admitting: Emergency Medicine

## 2021-06-21 ENCOUNTER — Other Ambulatory Visit: Payer: Self-pay

## 2021-06-21 ENCOUNTER — Ambulatory Visit
Admission: EM | Admit: 2021-06-21 | Discharge: 2021-06-21 | Disposition: A | Discharge status: Home / Self Care | Payer: Medicaid Other | Attending: Sports Medicine | Admitting: Sports Medicine

## 2021-06-21 DIAGNOSIS — R0602 Shortness of breath: Secondary | ICD-10-CM | POA: Diagnosis not present

## 2021-06-21 DIAGNOSIS — Z20822 Contact with and (suspected) exposure to covid-19: Secondary | ICD-10-CM | POA: Diagnosis not present

## 2021-06-21 DIAGNOSIS — J441 Chronic obstructive pulmonary disease with (acute) exacerbation: Secondary | ICD-10-CM | POA: Diagnosis not present

## 2021-06-21 DIAGNOSIS — R059 Cough, unspecified: Secondary | ICD-10-CM | POA: Insufficient documentation

## 2021-06-21 LAB — SARS CORONAVIRUS 2 (TAT 6-24 HRS): SARS Coronavirus 2: NEGATIVE

## 2021-06-21 MED ORDER — DOXYCYCLINE HYCLATE 100 MG PO CAPS: 100.0000 mg | ORAL_CAPSULE | Freq: Two times a day (BID) | ORAL | 0 refills | Status: DC

## 2021-06-21 MED ORDER — AEROCHAMBER MV MISC: 2 refills | Status: DC

## 2021-06-21 MED ORDER — PROMETHAZINE-DM 6.25-15 MG/5ML PO SYRP: 5.0000 mL | ORAL_SOLUTION | Freq: Four times a day (QID) | ORAL | 0 refills | Status: DC | PRN

## 2021-06-21 MED ORDER — BENZONATATE 100 MG PO CAPS: 200.0000 mg | ORAL_CAPSULE | Freq: Three times a day (TID) | ORAL | 0 refills | Status: DC

## 2021-06-21 MED ORDER — PREDNISONE 20 MG PO TABS: 60.0000 mg | ORAL_TABLET | Freq: Every day | ORAL | 0 refills | Status: DC

## 2021-06-21 MED ORDER — PREDNISONE 20 MG PO TABS: 60.0000 mg | ORAL_TABLET | Freq: Every day | ORAL | 0 refills | Status: AC

## 2021-06-21 NOTE — ED Triage Notes (Signed)
Pt c/o cough, wheezing, and headache. She states this has been going on for about 2 months but has gotten worse in the last 4 days. She has known COPD.

## 2021-06-21 NOTE — Discharge Instructions (Addendum)
Qvar inhaler twice daily to help with pulmonary inflammation.  Make sure that you are using the inhaler with the spacer that I have prescribed you.  Use your albuterol inhaler, 2 puffs every 4-6 hours around-the-clock, to help open up your lung fields and improve your work of breathing.  Use the Tessalon Perles every 8 hours during the day for cough and use the Promethazine DM cough syrup at bedtime.  Take the doxycycline twice daily with food for 10 days.  Start the prednisone today and you will take 60 mg daily for 5 days.  Make appointment to follow-up with pulmonology.  If your shortness of breath worsens then go to the emergency department for evaluation.

## 2021-06-21 NOTE — ED Provider Notes (Addendum)
[facility]    CSN: [id number] Arrival date & time: 06/21/21  1107      History   Chief Complaint Chief Complaint  Patient presents with   Cough    HPI Michelle Conley is a 50 y.o. female.   HPI  50 year old female here for evaluation of respiratory complaints.  Patient reports that for the last 2 months that she has been experiencing increasing cough and wheezing.  This is been coupled with a headache.  She states that in the last 4 days her symptoms have worsened.  She states that her cough is nonproductive.  She is a smoker.  She endorses a fever up to 101 with nasal congestion and runny nose.  Was admitted to [facility] on 05/19/2021 for cough and COPD exacerbation.  She is currently followed by pulmonology over at [facility].  Past Medical History:  Diagnosis Date   Abnormal chest xray    Reports that she has spots on her lungs   Anemia    Anxiety    Aortic valve disorder    Aortic valve insufficiency    Asthma    Cervical dysplasia    Complication of anesthesia    reports that she comes out of it slowly   Cranial nerve palsy    Depression    GERD (gastroesophageal reflux disease)    Goiter    History of kidney stones    History of meningitis    History of viral encephalitis    Hypertension    Hypothyroidism    Migraine headache    Post traumatic stress disorder (PTSD)    Scarlet fever    Shortness of breath dyspnea    Stroke (HCC)    Syncope and collapse     There are no problems to display for this patient.   Past Surgical History:  Procedure Laterality Date   ABDOMINAL SURGERY     CESAREAN SECTION  11/06/1995   CHOLECYSTECTOMY  11/05/2013   LINGUAL FRENECTOMY N/A 06/20/2015   Procedure: UPPER LABIAL L FRENECTOMY;  Surgeon: Lincoln Brigham, DDS;  Location: [facility] OR;  Service: Oral Surgery;  Laterality: N/A;   MULTIPLE EXTRACTIONS WITH ALVEOLOPLASTY N/A 06/20/2015   Procedure: EXTRACTION  OF TEETH NUMBERS 2-15,  19-30,ALVEOLOPLASTY OF MAXILLA  AND MANDIBLE;  Surgeon: Lincoln Brigham, DDS;  Location: [facility] OR;  Service: Oral Surgery;  Laterality: N/A;   NASAL SEPTUM SURGERY      OB History   No obstetric history on file.      Home Medications    Prior to Admission medications   Medication Sig Start Date End Date Taking? Authorizing Provider  albuterol (PROVENTIL HFA;VENTOLIN HFA) 108 (90 Base) MCG/ACT inhaler Inhale 1 puff into the lungs every 6 (six) hours as needed for wheezing or shortness of breath. 01/27/18  Yes Bridget Hartshorn L, PA-C  albuterol (PROVENTIL HFA;VENTOLIN HFA) 108 (90 Base) MCG/ACT inhaler Inhale 2 puffs into the lungs every 6 (six) hours as needed for wheezing or shortness of breath. 01/05/19  Yes Darci Current, MD  alprazolam Prudy Feeler) 2 MG tablet Take 2 mg by mouth 4 (four) times daily.   Yes [provider]  atorvastatin (LIPITOR) 20 MG tablet Take by mouth.   Yes [provider]  beclomethasone (QVAR) 80 MCG/ACT inhaler Inhale 2 puffs into the lungs every 6 (six) hours as needed (wheezing or shortness of breath).   Yes [provider]  benzonatate (TESSALON) 100 MG capsule Take 2 capsules (200 mg total) by  mouth every 8 (eight) hours. 06/21/21  Yes Becky Augusta, NP  doxycycline (VIBRAMYCIN) 100 MG capsule Take 1 capsule (100 mg total) by mouth 2 (two) times daily. 06/21/21  Yes Becky Augusta, NP  EPINEPHrine 0.3 mg/0.3 mL IJ SOAJ injection Inject 0.3 mg into the muscle once as needed (allergic reaction).   Yes [provider]  escitalopram (LEXAPRO) 20 MG tablet Take 20 mg by mouth daily.   Yes [provider]  ibuprofen (ADVIL,MOTRIN) 800 MG tablet Take 800 mg by mouth every 8 (eight) hours as needed for mild pain or moderate pain.   Yes [provider]  levothyroxine (SYNTHROID, LEVOTHROID) 300 MCG tablet Take 300 mcg by mouth daily.   Yes [provider]  methocarbamol (ROBAXIN) 750 MG tablet Take by mouth.   Yes  [provider]  nortriptyline (PAMELOR) 25 MG capsule Take 25 mg by mouth daily. 03/16/21  Yes [provider]  omeprazole (PRILOSEC) 40 MG capsule Take 40 mg by mouth 2 (two) times daily.   Yes [provider]  predniSONE (DELTASONE) 20 MG tablet Take 3 tablets (60 mg total) by mouth daily with breakfast for 5 days. 3 tablets daily for 5 days. 06/21/21 06/26/21 Yes Becky Augusta, NP  promethazine-dextromethorphan (PROMETHAZINE-DM) 6.25-15 MG/5ML syrup Take 5 mLs by mouth 4 (four) times daily as needed. 06/21/21  Yes Becky Augusta, NP  simvastatin (ZOCOR) 20 MG tablet Take 20 mg by mouth every evening.   Yes [provider]  Spacer/Aero-Holding Chambers (AEROCHAMBER MV) inhaler Use as instructed 06/21/21  Yes Becky Augusta, NP  tiotropium (SPIRIVA) 18 MCG inhalation capsule Place 18 mcg into inhaler and inhale daily.    Yes [provider]  traZODone (DESYREL) 100 MG tablet Take 100 mg by mouth at bedtime as needed for sleep.   Yes [provider]    Family History No family history on file.  Social History Social History   Tobacco Use   Smoking status: Every Day    Packs/day: 1.00    Years: 30.00    Pack years: 30.00    Types: Cigarettes   Smokeless tobacco: Never  Vaping Use   Vaping Use: Never used  Substance Use Topics   Alcohol use: No   Drug use: Yes    Types: Marijuana     Allergies   Cyclobenzaprine, Esomeprazole magnesium, Metronidazole, Mobic [meloxicam], Rofecoxib, Toradol [ketorolac tromethamine], Tramadol, and Penicillins   Review of Systems Review of Systems  Constitutional:  Positive for fever. Negative for activity change and appetite change.  HENT:  Positive for congestion and rhinorrhea.   Respiratory:  Positive for cough, shortness of breath and wheezing.   Neurological:  Positive for headaches.  Hematological: Negative.   Psychiatric/Behavioral: Negative.      Physical Exam Triage Vital Signs ED Triage  Vitals  Enc Vitals Group     BP 06/21/21 1123 (!) 142/91     Pulse Rate 06/21/21 1123 95     Resp 06/21/21 1123 20     Temp 06/21/21 1123 98.5 F (36.9 C)     Temp Source 06/21/21 1123 Oral     SpO2 06/21/21 1123 95 %     Weight 06/21/21 1124 139 lb 15.9 oz (63.5 kg)     Height 06/21/21 1124 4\' 11"  (1.499 m)     Head Circumference --      Peak Flow --      Pain Score 06/21/21 1123 7     Pain Loc --  Pain Edu? --      Excl. in GC? --    No data found.  Updated Vital Signs BP (!) 142/91 (BP Location: Left Arm)   Pulse 95   Temp 98.5 F (36.9 C) (Oral)   Resp 20   Ht  (1.499 m)   Wt 139 lb 15.9 oz (63.5 kg)   LMP 06/02/2015   SpO2 95%   BMI 28.27 kg/m   Visual Acuity Right Eye Distance:   Left Eye Distance:   Bilateral Distance:    Right Eye Near:   Left Eye Near:    Bilateral Near:     Physical Exam Vitals and nursing note reviewed.  Constitutional:      General: She is not in acute distress.    Appearance: Normal appearance. She is obese. She is ill-appearing.  HENT:     Head: Normocephalic and atraumatic.     Right Ear: Tympanic membrane, ear canal and external ear normal. There is no impacted cerumen.     Left Ear: Tympanic membrane, ear canal and external ear normal. There is no impacted cerumen.     Nose: Congestion and rhinorrhea present.     Mouth/Throat:     Mouth: Mucous membranes are moist.     Pharynx: Oropharynx is clear. Posterior oropharyngeal erythema present.  Cardiovascular:     Rate and Rhythm: Normal rate and regular rhythm.     Pulses: Normal pulses.     Heart sounds: Normal heart sounds. No murmur heard.   No gallop.  Pulmonary:     Effort: Pulmonary effort is normal.     Breath sounds: Normal breath sounds. No wheezing, rhonchi or rales.  Musculoskeletal:     Cervical back: Normal range of motion and neck supple.  Lymphadenopathy:     Cervical: No cervical adenopathy.  Skin:    General: Skin is warm and dry.      Capillary Refill: Capillary refill takes less than 2 seconds.     Findings: No erythema or rash.  Neurological:     General: No focal deficit present.     Mental Status: She is alert and oriented to person, place, and time.  Psychiatric:        Mood and Affect: Mood normal.        Behavior: Behavior normal.        Thought Content: Thought content normal.        Judgment: Judgment normal.     UC Treatments / Results  Labs (all labs ordered are listed, but only abnormal results are displayed) Labs Reviewed  SARS CORONAVIRUS 2 (TAT 6-24 HRS)    EKG   Radiology DG Chest 2 View  Result Date: 06/21/2021 CLINICAL DATA:  Cough and shortness of breath. EXAM: CHEST - 2 VIEW COMPARISON:  04/14/2021 FINDINGS: Lungs are hyperexpanded. The lungs are clear without focal pneumonia, edema, pneumothorax or pleural effusion. The cardiopericardial silhouette is within normal limits for size. Bones are diffusely demineralized. IMPRESSION: Hyperexpansion without acute cardiopulmonary findings. Electronically Signed   By: Kennith Center M.D.   On: 06/21/2021 12:29    Procedures Procedures (including critical care time)  Medications Ordered in UC Medications - No data to display  Initial Impression / Assessment and Plan / UC Course  I have reviewed the triage vital signs and the nursing notes.  Pertinent labs & imaging results that were available during my care of the patient were reviewed by me and considered in my medical decision making (see chart  for details).  Patient is a pleasant though ill-appearing 50 year old female here for evaluation of respiratory complaints that been present for the past 2 months worsening over the last 4 days.  She is a smoker and was [facility] COPD exacerbation and cough.  Discharged home with a refill of her inhalers and she was found to be in a hyperthyroid state so her Synthroid dose was decreased and she was referred to  pulmonology and PCP for follow-up.  Patient reports she does not currently have a PCP as her primary care doctor had a stroke and retired.  She has been evaluated by pulmonology.  She saw Dr. Gwen PoundsKowalski in clinic on 06/05/2021 shortness of breath.  Patient has a 66-pack-year smoking history and her PFT at that time was consistent with an obstructive pattern.  The impression being that her spirometry is consistent with severe obstruction/COPD there was a plan for trial of Trelegy and albuterol.  Patient's physical exam in clinic reveals pearly gray tympanic membranes bilaterally with a normal light reflex and clear external auditory canals.  Nasal mucosa is erythematous and edematous with scant clear nasal discharge.  Oropharyngeal tissues are erythematous without exudate of tonsillar pillars.  Patient does have clear postnasal drip.  No cervical lymphadenopathy appreciated exam.  Cardiopulmonary exam reveals clear lung sounds in all fields.  Due to patient's fever, worsening shortness of breath, and recent COPD admission will obtain chest x-ray to evaluate for possible infiltrates or acute intrathoracic process.  Will also test patient for COVID.  Chest x-ray independently reviewed and evaluated by me.  These lung spaces are well-pneumatized but there does appear to be some hyperinflation.  Pulmonary vasculature is prominent.  There are some patchy haziness on the AP view only in the bilateral lower lobes which may be lack of penetration due to overlying breast tissue versus developing edema.  This does not appear present in the lateral view so I think it is underpenetration due to patient's pendulous breasts.  Awaiting radiology overread. Radiology interpretation of chest x-ray is that there is hyperexpansion without acute cardiopulmonary findings.  Will discharge patient home with diagnosis of COPD exacerbation with prednisone help decrease inflammation.  I will also have the patient continue to use her Qvar and  her albuterol inhalers.  I will also prescribe spacer for the patient so she can get better delivery of her medications.  Patient to follow-up with pulmonology.  I will also give Tessalon Perles and Promethazine DM cough syrup to help with cough.  We will also cover the patient for her fever with doxycycline 100 mg twice daily for 10 days.   Final Clinical Impressions(s) / UC Diagnoses   Final diagnoses:  None     Discharge Instructions      Qvar inhaler twice daily to help with pulmonary inflammation.  Make sure that you are using the inhaler with the spacer that I have prescribed you.  Use your albuterol inhaler, 2 puffs every 4-6 hours around-the-clock, to help open up your lung fields and improve your work of breathing.  Use the Tessalon Perles every 8 hours during the day for cough and use the Promethazine DM cough syrup at bedtime.  Take the doxycycline twice daily with food for 10 days.  Start the prednisone today and you will take 60 mg daily for 5 days.  Make appointment to follow-up with pulmonology.  If your shortness of breath worsens then go to the emergency department for evaluation.  ED Prescriptions     Medication Sig Dispense Auth. Provider   predniSONE (DELTASONE) 20 MG tablet Take 3 tablets (60 mg total) by mouth daily with breakfast for 5 days. 3 tablets daily for 5 days. 15 tablet Becky Augusta, NP   Spacer/Aero-Holding Chambers (AEROCHAMBER MV) inhaler Use as instructed 1 each Becky Augusta, NP   promethazine-dextromethorphan (PROMETHAZINE-DM) 6.25-15 MG/5ML syrup Take 5 mLs by mouth 4 (four) times daily as needed. 118 mL Becky Augusta, NP   benzonatate (TESSALON) 100 MG capsule Take 2 capsules (200 mg total) by mouth every 8 (eight) hours. 21 capsule Becky Augusta, NP   doxycycline (VIBRAMYCIN) 100 MG capsule Take 1 capsule (100 mg total) by mouth 2 (two) times daily. 20 capsule Becky Augusta, NP      PDMP not reviewed this encounter.   Becky Augusta,  NP 06/21/21 1243    Becky Augusta, NP 06/21/21 1245    Becky Augusta, NP 06/21/21 1246

## 2021-08-17 ENCOUNTER — Ambulatory Visit: Payer: Self-pay

## 2021-08-30 ENCOUNTER — Ambulatory Visit: Payer: Medicaid Other | Attending: Otolaryngology

## 2021-08-30 DIAGNOSIS — I10 Essential (primary) hypertension: Secondary | ICD-10-CM | POA: Diagnosis not present

## 2021-08-30 DIAGNOSIS — G4733 Obstructive sleep apnea (adult) (pediatric): Secondary | ICD-10-CM | POA: Insufficient documentation

## 2021-08-30 DIAGNOSIS — Z8673 Personal history of transient ischemic attack (TIA), and cerebral infarction without residual deficits: Secondary | ICD-10-CM | POA: Insufficient documentation

## 2021-09-10 DIAGNOSIS — A419 Sepsis, unspecified organism: Secondary | ICD-10-CM | POA: Insufficient documentation

## 2021-09-10 DIAGNOSIS — N12 Tubulo-interstitial nephritis, not specified as acute or chronic: Secondary | ICD-10-CM | POA: Insufficient documentation

## 2021-09-15 ENCOUNTER — Other Ambulatory Visit: Payer: Self-pay

## 2021-10-12 DIAGNOSIS — G4733 Obstructive sleep apnea (adult) (pediatric): Secondary | ICD-10-CM | POA: Insufficient documentation

## 2022-07-19 DIAGNOSIS — I10 Essential (primary) hypertension: Secondary | ICD-10-CM | POA: Insufficient documentation

## 2022-07-20 ENCOUNTER — Ambulatory Visit: Payer: Self-pay

## 2022-10-12 IMAGING — MR MR HEAD WO/W CM
14 series · 48 of 48 positions shown · IV contrast (gadavist)
Comparison: MRI 11/24/2016.

CLINICAL DATA: History of migraines and CVA.

EXAM:
MRI HEAD WITHOUT AND WITH CONTRAST
TECHNIQUE: Multiplanar, multiecho pulse sequences of the brain and surrounding
structures were obtained without and with intravenous contrast.
CONTRAST:  6mL GADAVIST GADOBUTROL 1 MMOL/ML IV SOLN

[Series 5: ax dwi_tracew · axial · 3.0mm · 0.65mm/px · z∈[-106,+49]mm · 3 of 48 slices shown]
[im 1/48]
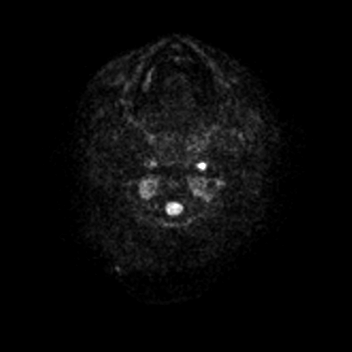
[im 24/48]
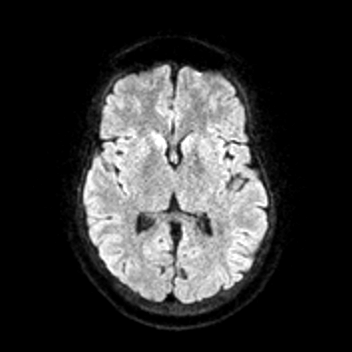
[im 48/48]
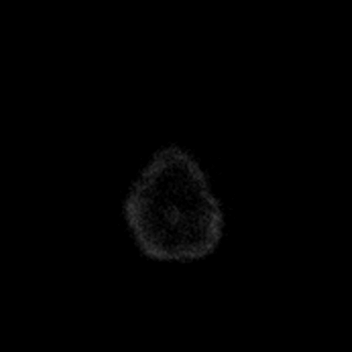

[Series 6: ax dwi_adc · axial · 3.0mm · 0.65mm/px · z∈[-106,+49]mm · 3 of 48 slices shown]
[im 1/48]
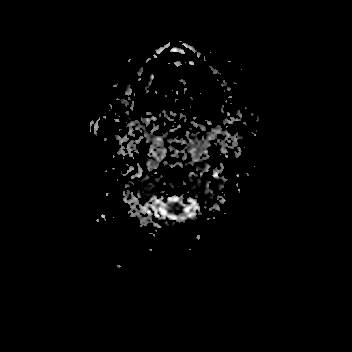
[im 24/48]
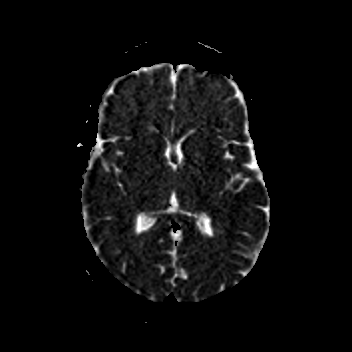
[im 48/48]
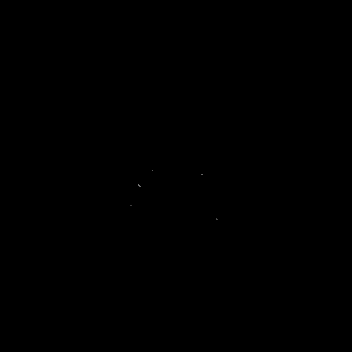

[Series 7: cor dwi_tracew · coronal · 5.0mm · 0.60mm/px · 2 of 38 slices shown]
[im 1/38]
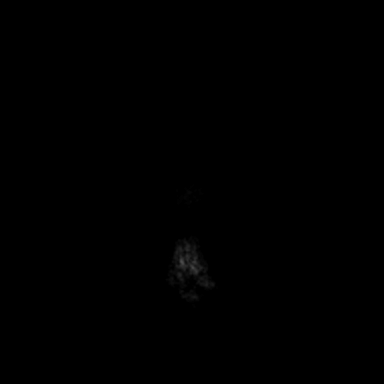
[im 38/38]
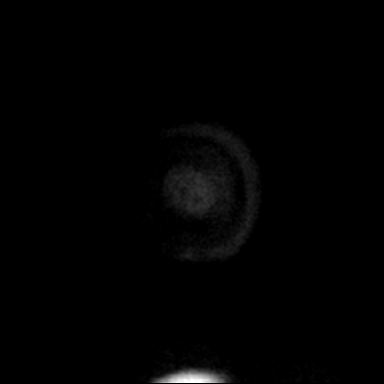

[Series 8: cor dwi_adc · coronal · 5.0mm · 0.60mm/px · 2 of 38 slices shown]
[im 1/38]
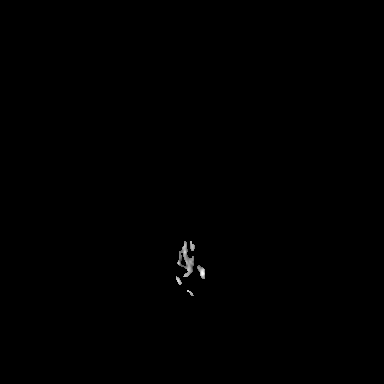
[im 38/38]
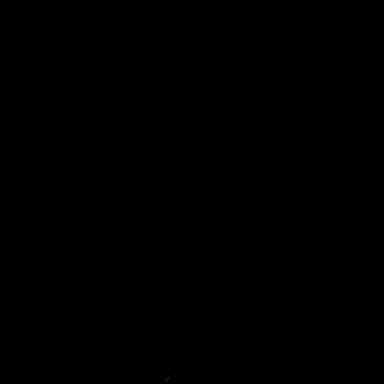

[Series 9: T1 · sagittal · 5.0mm · 0.62mm/px · 1 of 25 slices shown (1 of 2)]
[im 1/25]
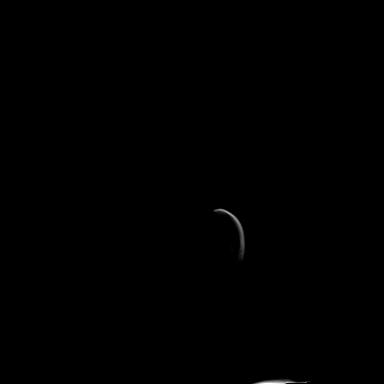

[Series 10: T2 · axial · 5.0mm · 0.53mm/px · 1 of 25 slices shown (1 of 2)]
[im 1/25]
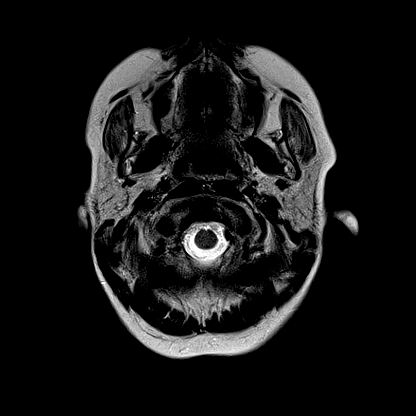

[Series 11: mag_images · axial · 3.0mm · 0.90mm/px · z∈[-115,+62]mm · 3 of 60 slices shown]
[im 1/60]
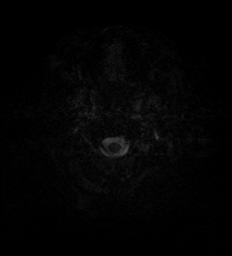
[im 30/60]
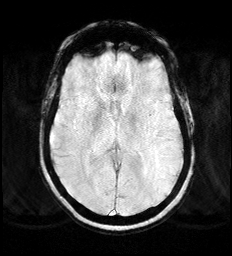
[im 60/60]
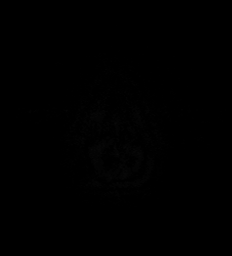

[Series 12: pha_images · axial · 3.0mm · 0.90mm/px · z∈[-115,+62]mm · 3 of 60 slices shown]
[im 1/60]
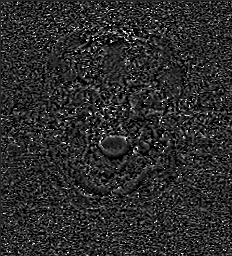
[im 30/60]
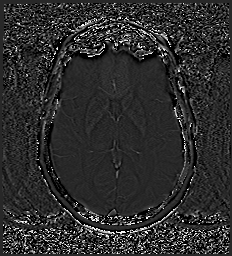
[im 60/60]
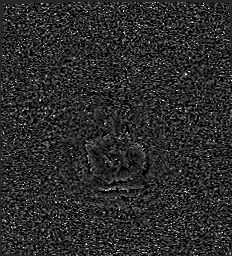

[Series 13: swi_images · axial · 3.0mm · 0.90mm/px · z∈[-115,+62]mm · 3 of 60 slices shown]
[im 1/60]
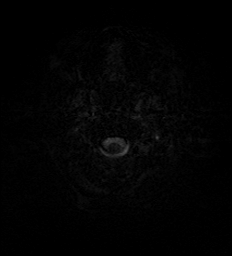
[im 30/60]
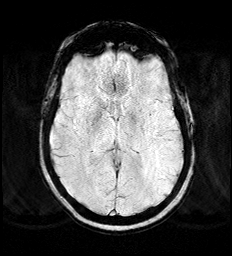
[im 60/60]
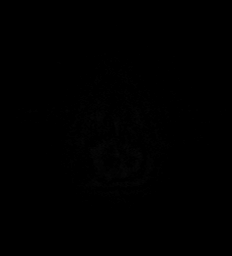

[Series 15: FLAIR · axial · 3.0mm · 0.53mm/px · z∈[-107,+54]mm · 3 of 55 slices shown]
[im 1/55]
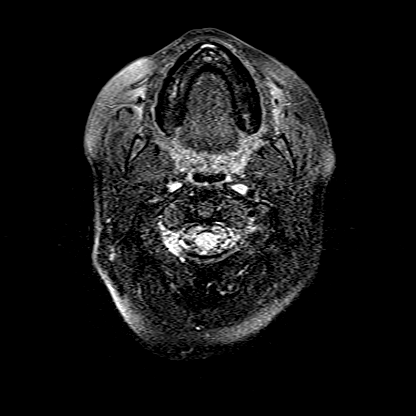
[im 28/55]
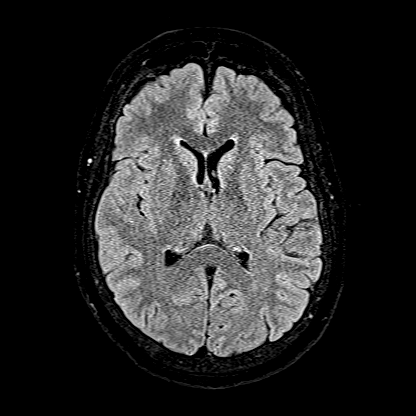
[im 55/55]
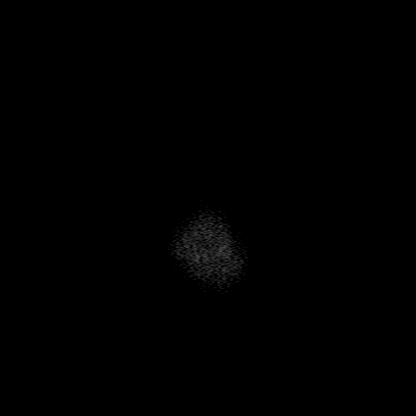

[Series 16: T1 · axial · 1.0mm · 0.98mm/px · z∈[-116,+59]mm · 10 of 176 slices shown (2 of 2)]
[im 1/176]
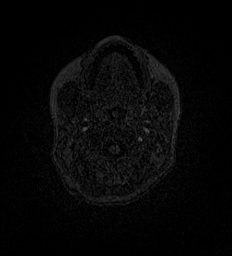
[im 20/176]
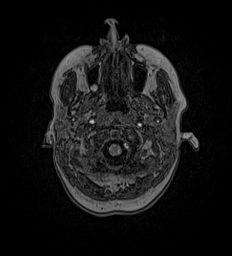
[im 39/176]
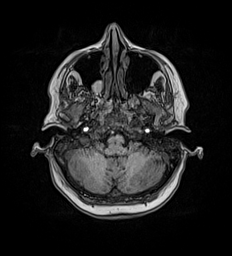
[im 59/176]
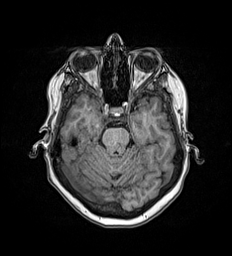
[im 78/176]
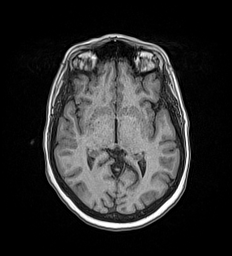
[im 98/176]
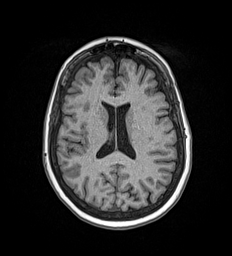
[im 117/176]
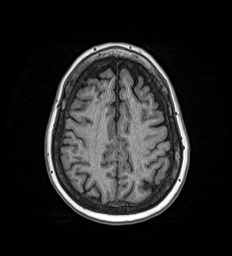
[im 137/176]
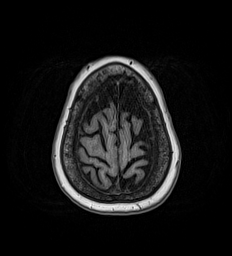
[im 156/176]
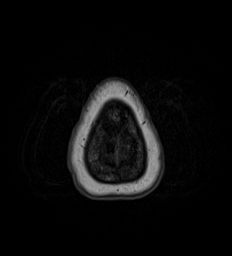
[im 176/176]
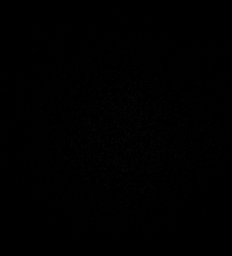

[Series 17: T2 · coronal · 5.0mm · 0.57mm/px · 2 of 29 slices shown (2 of 2)]
[im 1/29]
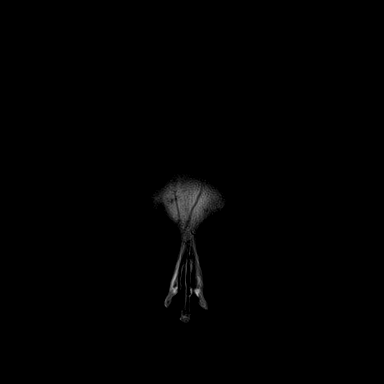
[im 29/29]
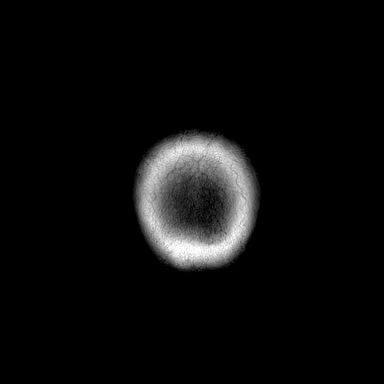

[Series 18: T1 post-contrast · axial · 1.0mm · 0.98mm/px · z∈[-116,+59]mm · 10 of 176 slices shown (1 of 2)]
[im 1/176]
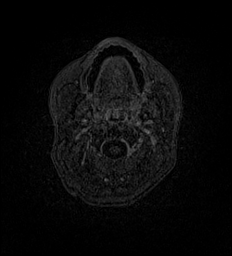
[im 20/176]
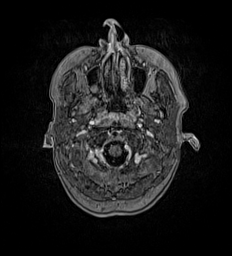
[im 39/176]
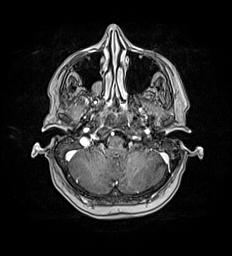
[im 59/176]
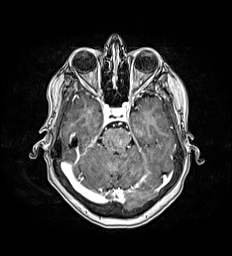
[im 78/176]
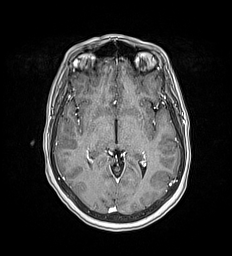
[im 98/176]
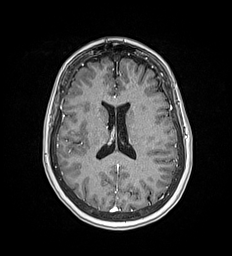
[im 117/176]
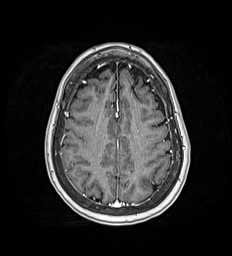
[im 137/176]
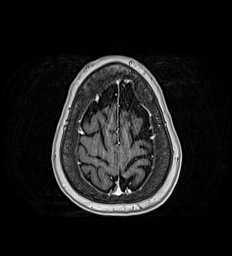
[im 156/176]
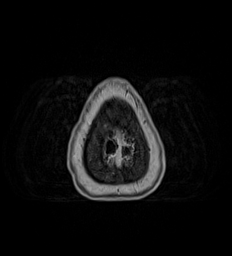
[im 176/176]
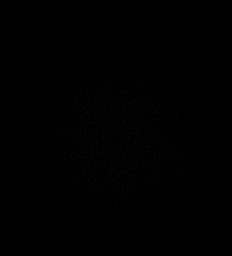

[Series 19: T1 post-contrast · coronal · 5.0mm · 0.57mm/px · 2 of 29 slices shown (2 of 2)]
[im 1/29]
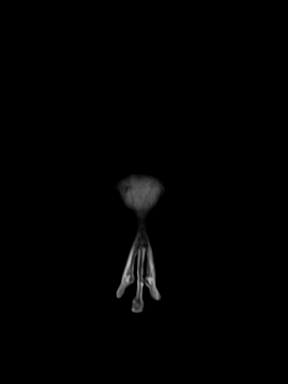
[im 29/29]
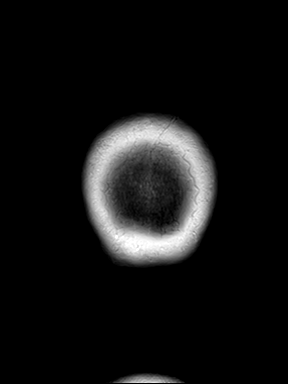

[48 of 48 positions shown; findings below may reference images not displayed]

FINDINGS: Brain: No acute infarction, hemorrhage, hydrocephalus, extra-axial
collection or mass lesion.

Vascular: Major arterial flow voids are maintained. Small right
intradural vertebral artery, similar to prior and likely non
dominant.

Skull and upper cervical spine: Normal marrow signal.

Sinuses/Orbits: Retention cysts in the posterior right maxillary
sinus. Otherwise, clear sinuses. Unremarkable orbits.

Other: Adenoid hypertrophy. Moderate bilateral mastoid effusions,
similar to prior.
IMPRESSION: 1. Normal appearance of the brain.  No acute abnormality.
2. Moderate bilateral mastoid effusions, similar to prior.
3. Adenoid hypertrophy. Consider correlation with the presence or
absence of the signs/symptoms of obstructive sleep apnea.

## 2022-11-05 IMAGING — CR DG CHEST 2V
2 series · 2 of 2 positions shown · non-contrast
Comparison: January 04, 2019.

CLINICAL DATA: Shortness of breath.

EXAM:
CHEST - 2 VIEW

[chest pa]
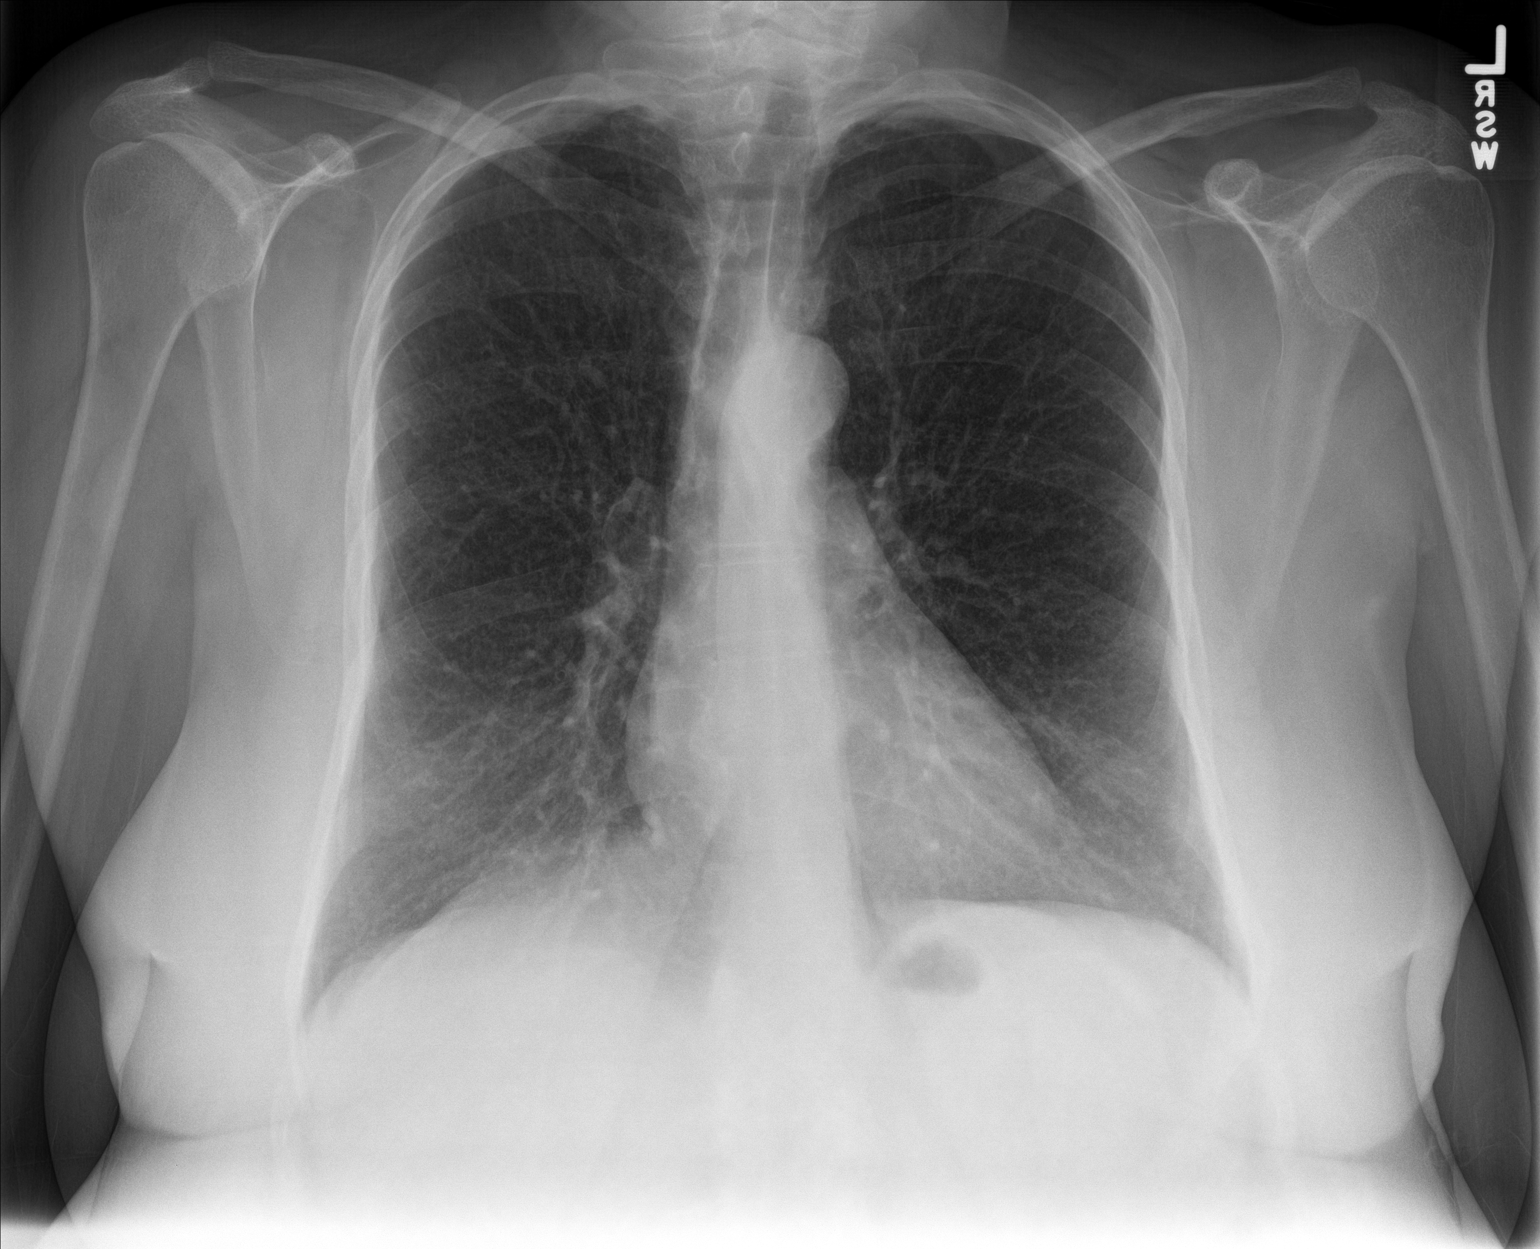

[chest lat]
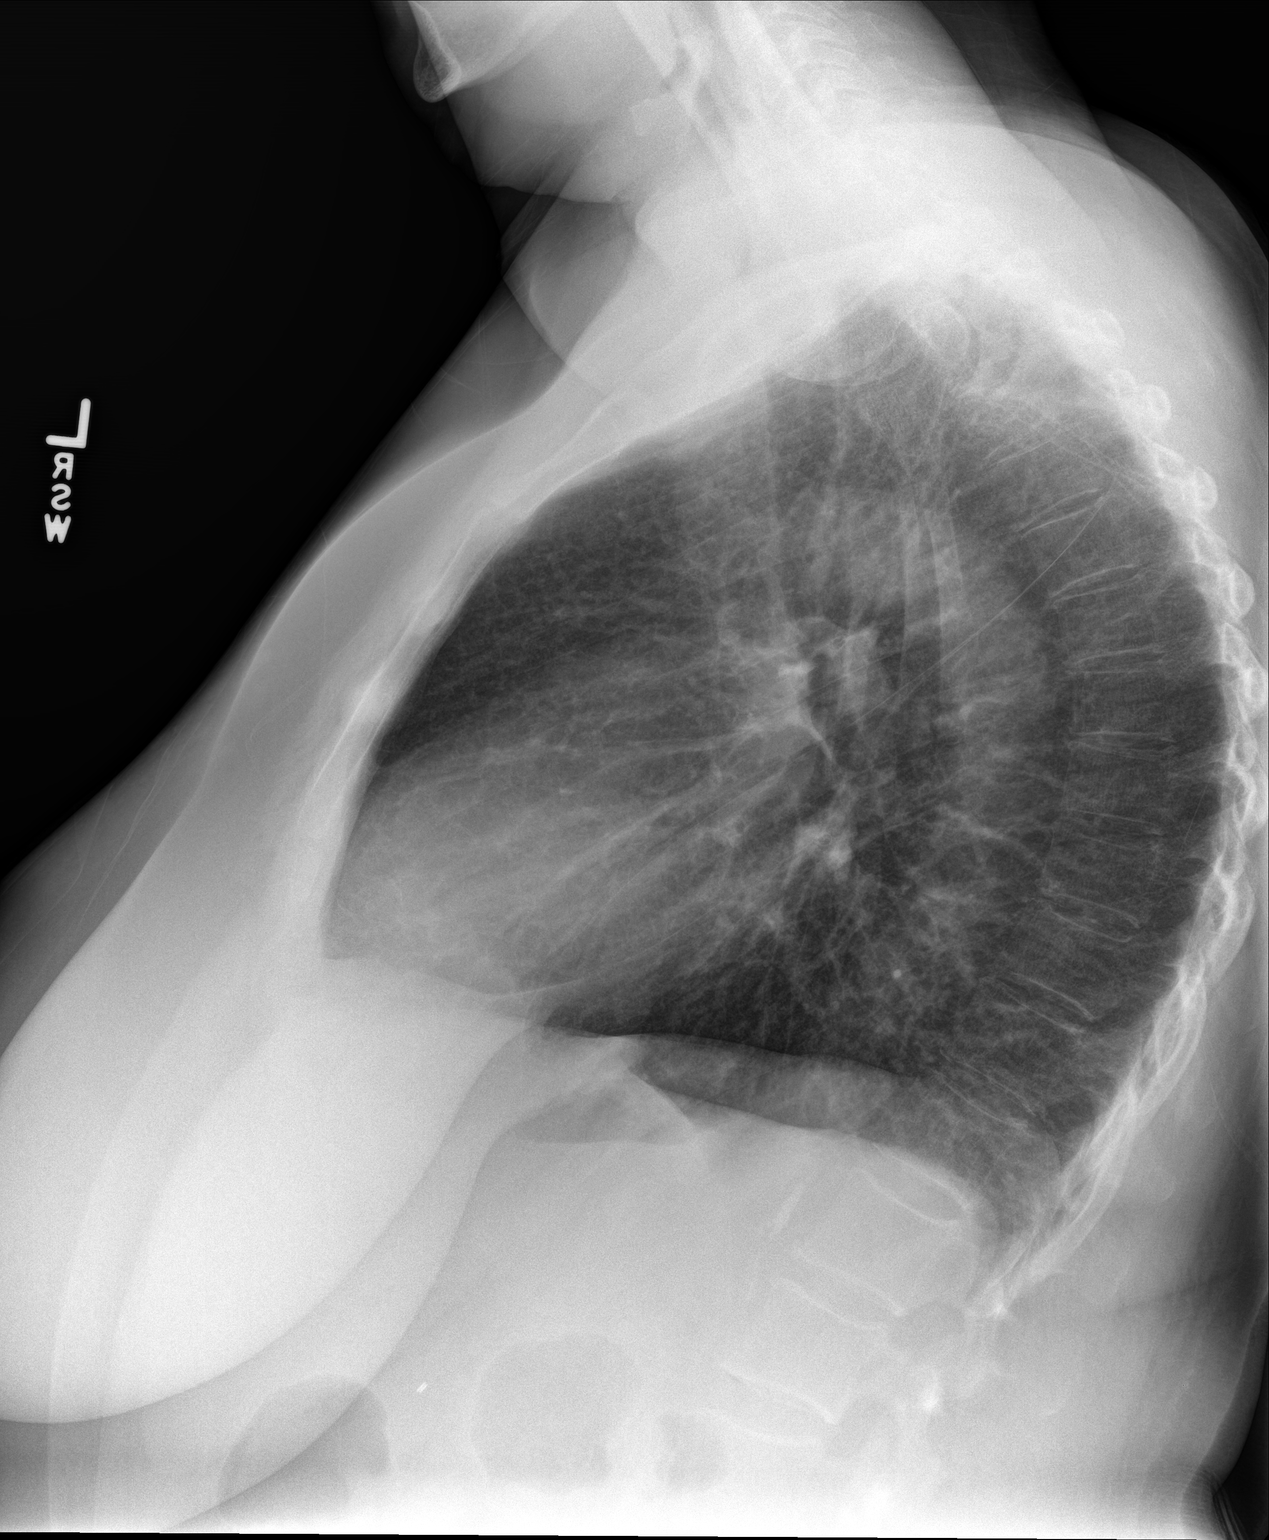

[2 of 2 positions shown; findings below may reference images not displayed]

FINDINGS: The heart size and mediastinal contours are within normal limits.
Both lungs are clear. The visualized skeletal structures are
unremarkable.
IMPRESSION: No active cardiopulmonary disease.

## 2023-02-01 ENCOUNTER — Ambulatory Visit: Payer: Self-pay

## 2023-03-03 ENCOUNTER — Ambulatory Visit
Admission: EM | Admit: 2023-03-03 | Discharge: 2023-03-03 | Disposition: A | Discharge status: Home / Self Care | Payer: Medicaid Other | Attending: Physician Assistant | Admitting: Physician Assistant

## 2023-03-03 DIAGNOSIS — R051 Acute cough: Secondary | ICD-10-CM | POA: Diagnosis not present

## 2023-03-03 DIAGNOSIS — J441 Chronic obstructive pulmonary disease with (acute) exacerbation: Secondary | ICD-10-CM

## 2023-03-03 DIAGNOSIS — J029 Acute pharyngitis, unspecified: Secondary | ICD-10-CM | POA: Diagnosis not present

## 2023-03-03 LAB — GROUP A STREP BY PCR: Group A Strep by PCR: NOT DETECTED

## 2023-03-03 MED ORDER — PREDNISONE 20 MG PO TABS: 40.0000 mg | ORAL_TABLET | Freq: Every day | ORAL | 0 refills | Status: AC

## 2023-03-03 MED ORDER — CHERATUSSIN AC 100-10 MG/5ML PO SOLN: 10.0000 mL | Freq: Three times a day (TID) | ORAL | 0 refills | Status: DC | PRN

## 2023-03-03 MED ORDER — AZITHROMYCIN 250 MG PO TABS: 250.0000 mg | ORAL_TABLET | Freq: Every day | ORAL | 0 refills | Status: DC

## 2023-03-03 NOTE — ED Triage Notes (Signed)
Pt c/o cough, sore throat x58month   Pt was around her nephew who has strep and an ear infection  Pt states that she is having a sore throat and she can not swallow.   Pt states that she does not like taking pain medication.

## 2023-03-03 NOTE — Discharge Instructions (Signed)
-  Negative strep -Your COPD is flared up. I sent meds for you. Continue inhalers. Go to ER if symptoms worsen.

## 2023-03-03 NOTE — ED Provider Notes (Signed)
[facility]    CSN: [id number] Arrival date & time: 03/03/23  1208      History   Chief Complaint Chief Complaint  Patient presents with   Sore Throat    HPI Michelle Conley is a 52 y.o. female presenting for sore throat and productive cough as well as increased SOB from baseline x 1 month, worsening over the past 1 week. No fevers. Has been exposed to strep.  Patient does have history of COPD and aortic valve insufficiency. Follows up with cardiology and pulmonology. Patient says she uses multiple inhalers including Spiriva, Symbicort, albuterol, and Qvar.  Patient continues to smoke.  Says it is very difficult to quit smoking.  She admits to a lot of anxiety and depression and says that smoking helps calm her.  Patient does take Lexapro and a benzodiazepine and says she has a therapist.  Patient says that her shortness of breath is mostly whenever she gets up and walks a distance.  She denies any associated chest pain or leg swelling.  No palpitations or weakness.  Patient says she has a chronic cough that is especially bad at night.  Admits to getting choked up at night with the cough.  It is often productive of brownish mucus.  No significant worsening of symptoms in the past 1 week. Requests Cheratussin. No exposure to COVID-19.  No fevers.  No other complaints or concerns.  HPI  Past Medical History:  Diagnosis Date   Abnormal chest xray    Reports that she has spots on her lungs   Anemia    Anxiety    Aortic valve disorder    Aortic valve insufficiency    Asthma    Cervical dysplasia    Complication of anesthesia    reports that she comes out of it slowly   Cranial nerve palsy    Depression    GERD (gastroesophageal reflux disease)    Goiter    History of kidney stones    History of meningitis    History of viral encephalitis    Hypertension    Hypothyroidism    Migraine headache    Post traumatic stress disorder (PTSD)    Scarlet fever    Shortness  of breath dyspnea    Stroke (HCC)    Syncope and collapse     There are no problems to display for this patient.   Past Surgical History:  Procedure Laterality Date   ABDOMINAL SURGERY     CESAREAN SECTION  11/06/1995   CHOLECYSTECTOMY  11/05/2013   LINGUAL FRENECTOMY N/A 06/20/2015   Procedure: UPPER LABIAL L FRENECTOMY;  Surgeon: Lincoln Brigham, DDS;  Location: [facility] OR;  Service: Oral Surgery;  Laterality: N/A;   MULTIPLE EXTRACTIONS WITH ALVEOLOPLASTY N/A 06/20/2015   Procedure: EXTRACTION  OF TEETH NUMBERS 2-15, 19-30,ALVEOLOPLASTY OF MAXILLA  AND MANDIBLE;  Surgeon: Lincoln Brigham, DDS;  Location: [facility] OR;  Service: Oral Surgery;  Laterality: N/A;   NASAL SEPTUM SURGERY      OB History   No obstetric history on file.      Home Medications    Prior to Admission medications   Medication Sig Start Date End Date Taking? Authorizing Provider  albuterol (PROVENTIL HFA;VENTOLIN HFA) 108 (90 Base) MCG/ACT inhaler Inhale 1 puff into the lungs every 6 (six) hours as needed for wheezing or shortness of breath. 01/27/18  Yes Bridget Hartshorn L, PA-C  albuterol (PROVENTIL HFA;VENTOLIN HFA) 108 (90 Base) MCG/ACT inhaler Inhale 2 puffs into the lungs  every 6 (six) hours as needed for wheezing or shortness of breath. 01/05/19  Yes Darci Current, MD  alprazolam Prudy Feeler) 2 MG tablet Take 2 mg by mouth 4 (four) times daily.   Yes [provider]  atorvastatin (LIPITOR) 20 MG tablet Take by mouth.   Yes [provider]  azithromycin (ZITHROMAX) 250 MG tablet Take 1 tablet (250 mg total) by mouth daily. Take first 2 tablets together, then 1 every day until finished. 03/03/23  Yes Shirlee Latch, PA-C  beclomethasone (QVAR) 80 MCG/ACT inhaler Inhale 2 puffs into the lungs every 6 (six) hours as needed (wheezing or shortness of breath).   Yes [provider]  EPINEPHrine 0.3 mg/0.3 mL IJ SOAJ injection Inject 0.3 mg into the muscle once as needed (allergic reaction).    Yes [provider]  escitalopram (LEXAPRO) 20 MG tablet Take 20 mg by mouth daily.   Yes [provider]  guaiFENesin-codeine (CHERATUSSIN AC) 100-10 MG/5ML syrup Take 10 mLs by mouth 3 (three) times daily as needed for cough or congestion. 03/03/23  Yes Shirlee Latch, PA-C  ibuprofen (ADVIL,MOTRIN) 800 MG tablet Take 800 mg by mouth every 8 (eight) hours as needed for mild pain or moderate pain.   Yes [provider]  levothyroxine (SYNTHROID, LEVOTHROID) 300 MCG tablet Take 300 mcg by mouth daily.   Yes [provider]  methocarbamol (ROBAXIN) 750 MG tablet Take by mouth.   Yes [provider]  nortriptyline (PAMELOR) 25 MG capsule Take 25 mg by mouth daily. 03/16/21  Yes [provider]  omeprazole (PRILOSEC) 40 MG capsule Take 40 mg by mouth 2 (two) times daily.   Yes [provider]  predniSONE (DELTASONE) 20 MG tablet Take 2 tablets (40 mg total) by mouth daily for 5 days. 03/03/23 03/08/23 Yes Shirlee Latch, PA-C  simvastatin (ZOCOR) 20 MG tablet Take 20 mg by mouth every evening.   Yes [provider]  Spacer/Aero-Holding Chambers (AEROCHAMBER MV) inhaler Use as instructed 06/21/21  Yes Becky Augusta, NP  tiotropium (SPIRIVA) 18 MCG inhalation capsule Place 18 mcg into inhaler and inhale daily.    Yes [provider]  traZODone (DESYREL) 100 MG tablet Take 100 mg by mouth at bedtime as needed for sleep.   Yes [provider]    Family History History reviewed. No pertinent family history.  Social History Social History   Tobacco Use   Smoking status: Some Days    Packs/day: 1.00    Years: 30.00    Additional pack years: 0.00    Total pack years: 30.00    Types: Cigarettes   Smokeless tobacco: Never  Vaping Use   Vaping Use: Never used  Substance Use Topics   Alcohol use: No   Drug use: Yes    Types: Marijuana     Allergies   Cyclobenzaprine, Esomeprazole magnesium, Metronidazole,  Mobic [meloxicam], Rofecoxib, Toradol [ketorolac tromethamine], Tramadol, and Penicillins   Review of Systems Review of Systems  Constitutional:  Positive for fatigue. Negative for fever.  HENT:  Positive for congestion, rhinorrhea and sore throat.   Respiratory:  Positive for cough, shortness of breath and wheezing.   Cardiovascular:  Negative for chest pain, palpitations and leg swelling.  Gastrointestinal:  Negative for abdominal pain, blood in stool, nausea and vomiting.  Genitourinary:  Negative for dysuria and hematuria.  Musculoskeletal:  Negative for back pain.  Neurological:  Negative for weakness.     Physical Exam Triage Vital Signs ED  Triage Vitals  Enc Vitals Group     BP 04/14/21 0845 123/71     Pulse Rate 04/14/21 0845 100     Resp 04/14/21 0845 16     Temp 04/14/21 0845 97.9 F (36.6 C)     Temp Source 04/14/21 0845 Oral     SpO2 04/14/21 0845 94 %     Weight 04/14/21 0840 140 lb (63.5 kg)     Height 04/14/21 0840 4\' 11"  (1.499 m)     Head Circumference --      Peak Flow --      Pain Score 04/14/21 0840 7     Pain Loc --      Pain Edu? --      Excl. in GC? --    No data found.  Updated Vital Signs BP 121/71 (BP Location: Left Arm)   Pulse 98   Temp 99 F (37.2 C) (Oral)   Ht 4\' 11"  (1.499 m)   Wt 160 lb (72.6 kg)   LMP 06/02/2015   SpO2 92%   BMI 32.32 kg/m       Physical Exam Vitals and nursing note reviewed.  Constitutional:      General: She is not in acute distress.    Appearance: Normal appearance. She is well-developed. She is not ill-appearing or toxic-appearing.  HENT:     Head: Normocephalic and atraumatic.     Nose: Nose normal.     Mouth/Throat:     Mouth: Mucous membranes are moist.     Pharynx: Oropharynx is clear.  Eyes:     General: No scleral icterus.       Right eye: No discharge.        Left eye: No discharge.     Conjunctiva/sclera: Conjunctivae normal.  Cardiovascular:     Rate and Rhythm: Normal rate and regular  rhythm.     Heart sounds: Normal heart sounds.  Pulmonary:     Effort: Pulmonary effort is normal. No respiratory distress.     Breath sounds: Wheezing (few scattered wheezes throughout) present.     Comments: Taking a deep breath and during auscultation of lungs elicits coughing fits by patient. Musculoskeletal:     Cervical back: Neck supple.  Skin:    General: Skin is warm and dry.  Neurological:     General: No focal deficit present.     Mental Status: She is alert. Mental status is at baseline.     Motor: No weakness.     Gait: Gait normal.  Psychiatric:        Mood and Affect: Mood normal.        Behavior: Behavior normal.        Thought Content: Thought content normal.      UC Treatments / Results  Labs (all labs ordered are listed, but only abnormal results are displayed) Labs Reviewed  GROUP A STREP BY PCR    EKG   Radiology No results found.  Procedures Procedures (including critical care time)  Medications Ordered in UC Medications - No data to display  Initial Impression / Assessment and Plan / UC Course  I have reviewed the triage vital signs and the nursing notes.  Pertinent labs & imaging results that were available during my care of the patient were reviewed by me and considered in my medical decision making (see chart for details).  52 year old female presenting for sore throat, postnasal drainage, congestion, fatigue, productive cough and shortness of breath x 1 month, worsening  over the past 1 week..  She does have history of COPD, asthma, tobacco abuse, and aortic valve insufficiency.  Patient has multiple specialist including cardiologist, pulmonologist and psychiatrist.  Patient takes multiple inhalers.  Oxygen today is stable at 92%.  She has a few wheezes throughout.  Strep test performed today was negative.  Advised patient that I can treat her for COPD exacerbation with prednisone and azithromycin.  Advised her to continue with her inhalers and  follow-up with her specialist.  Patient was requesting for cough, especially at night.  Says she has tried all the over-the-counter medications without relief.  Patient requesting Cheratussin.  Reviewed that it is not safe or advised to take this medication long-term.  Advised she can take it short term at night as needed for cough.  Advised that is not something to provide refills on.  Reviewed ED red flag signs and symptoms related to shortness of breath or patient.   Final Clinical Impressions(s) / UC Diagnoses   Final diagnoses:  COPD exacerbation (HCC)  Sore throat  Acute cough     Discharge Instructions      -Negative strep -Your COPD is flared up. I sent meds for you. Continue inhalers. Go to ER if symptoms worsen.       ED Prescriptions     Medication Sig Dispense Auth. Provider   azithromycin (ZITHROMAX) 250 MG tablet Take 1 tablet (250 mg total) by mouth daily. Take first 2 tablets together, then 1 every day until finished. 6 tablet Eusebio Friendly B, PA-C   predniSONE (DELTASONE) 20 MG tablet Take 2 tablets (40 mg total) by mouth daily for 5 days. 10 tablet Eusebio Friendly B, PA-C   guaiFENesin-codeine (CHERATUSSIN AC) 100-10 MG/5ML syrup Take 10 mLs by mouth 3 (three) times daily as needed for cough or congestion. 118 mL Shirlee Latch, PA-C      I have reviewed the PDMP during this encounter.     Shirlee Latch, PA-C 03/03/23 1330

## 2023-06-18 ENCOUNTER — Telehealth: Payer: Self-pay | Admitting: *Deleted

## 2023-06-18 ENCOUNTER — Telehealth: Payer: Self-pay

## 2023-06-18 ENCOUNTER — Other Ambulatory Visit: Payer: Self-pay | Admitting: *Deleted

## 2023-06-18 DIAGNOSIS — R195 Other fecal abnormalities: Secondary | ICD-10-CM

## 2023-06-18 MED ORDER — NA SULFATE-K SULFATE-MG SULF 17.5-3.13-1.6 GM/177ML PO SOLN: 1.0000 | Freq: Once | ORAL | 0 refills | Status: AC

## 2023-06-18 NOTE — Telephone Encounter (Signed)
Patient left a message on the voicemail that she is calling to schedule her colonoscopy that we received a referral for. Please call patient back to schedule

## 2023-06-18 NOTE — Telephone Encounter (Signed)
Colonoscopy schedule on 07/10/2023 with Dr Allegra Lai

## 2023-06-18 NOTE — Telephone Encounter (Signed)
Gastroenterology Pre-Procedure Review  Request Date: 07/10/2023 Requesting Physician: Dr. Allegra Lai  PATIENT REVIEW QUESTIONS: The patient responded to the following health history questions as indicated:    1. Are you having any GI issues? no 2. Do you have a personal history of Polyps? no 3. Do you have a family history of Colon Cancer or Polyps? no 4. Diabetes Mellitus? no 5. Joint replacements in the past 12 months?no 6. Major health problems in the past 3 months?no 7. Any artificial heart valves, MVP, or defibrillator?no    MEDICATIONS & ALLERGIES:    Patient reports the following regarding taking any anticoagulation/antiplatelet therapy:   Plavix, Coumadin, Eliquis, Xarelto, Lovenox, Pradaxa, Brilinta, or Effient? no Aspirin? no  Patient confirms/reports the following medications:  Current Outpatient Medications  Medication Sig Dispense Refill   albuterol (PROVENTIL HFA;VENTOLIN HFA) 108 (90 Base) MCG/ACT inhaler Inhale 1 puff into the lungs every 6 (six) hours as needed for wheezing or shortness of breath. 1 Inhaler 0   albuterol (PROVENTIL HFA;VENTOLIN HFA) 108 (90 Base) MCG/ACT inhaler Inhale 2 puffs into the lungs every 6 (six) hours as needed for wheezing or shortness of breath. 1 Inhaler 0   alprazolam (XANAX) 2 MG tablet Take 2 mg by mouth 4 (four) times daily.     atorvastatin (LIPITOR) 20 MG tablet Take by mouth.     azithromycin (ZITHROMAX) 250 MG tablet Take 1 tablet (250 mg total) by mouth daily. Take first 2 tablets together, then 1 every day until finished. 6 tablet 0   beclomethasone (QVAR) 80 MCG/ACT inhaler Inhale 2 puffs into the lungs every 6 (six) hours as needed (wheezing or shortness of breath).     EPINEPHrine 0.3 mg/0.3 mL IJ SOAJ injection Inject 0.3 mg into the muscle once as needed (allergic reaction).     escitalopram (LEXAPRO) 20 MG tablet Take 20 mg by mouth daily.     guaiFENesin-codeine (CHERATUSSIN AC) 100-10 MG/5ML syrup Take 10 mLs by mouth 3 (three)  times daily as needed for cough or congestion. 118 mL 0   ibuprofen (ADVIL,MOTRIN) 800 MG tablet Take 800 mg by mouth every 8 (eight) hours as needed for mild pain or moderate pain.     levothyroxine (SYNTHROID, LEVOTHROID) 300 MCG tablet Take 300 mcg by mouth daily.     methocarbamol (ROBAXIN) 750 MG tablet Take by mouth.     nortriptyline (PAMELOR) 25 MG capsule Take 25 mg by mouth daily.     omeprazole (PRILOSEC) 40 MG capsule Take 40 mg by mouth 2 (two) times daily.     simvastatin (ZOCOR) 20 MG tablet Take 20 mg by mouth every evening.     Spacer/Aero-Holding Chambers (AEROCHAMBER MV) inhaler Use as instructed 1 each 2   tiotropium (SPIRIVA) 18 MCG inhalation capsule Place 18 mcg into inhaler and inhale daily.      traZODone (DESYREL) 100 MG tablet Take 100 mg by mouth at bedtime as needed for sleep.     No current facility-administered medications for this visit.    Patient confirms/reports the following allergies:  Allergies  Allergen Reactions   Cyclobenzaprine Other (See Comments)   Esomeprazole Magnesium Other (See Comments)    Headaches   Metronidazole Other (See Comments)    Other Reaction: Not Assessed   Mobic [Meloxicam]    Rofecoxib Other (See Comments)    vioxx    Toradol [Ketorolac Tromethamine] Other (See Comments)    Pt reports "causes rebound migraines"   Tramadol Other (See Comments)    Other Reaction:  Not Assessed   Penicillins Nausea And Vomiting and Rash    No orders of the defined types were placed in this encounter.   AUTHORIZATION INFORMATION Primary Insurance: 1D#: Group #:  Secondary Insurance: 1D#: Group #:  SCHEDULE INFORMATION: Date:  07/10/2023 Time: Location:  [facility]

## 2023-06-18 NOTE — Telephone Encounter (Signed)
Tried to call patient but got a busy signal.

## 2023-07-10 ENCOUNTER — Ambulatory Visit: Payer: Medicaid Other | Admitting: Certified Registered"

## 2023-07-10 ENCOUNTER — Encounter
Admission: RE | Disposition: A | Discharge status: Home / Self Care | Payer: Self-pay | Source: Home / Self Care | Attending: Gastroenterology

## 2023-07-10 ENCOUNTER — Ambulatory Visit
Admission: RE | Admit: 2023-07-10 | Discharge: 2023-07-10 | Disposition: A | Discharge status: Home / Self Care | Payer: Medicaid Other | Attending: Gastroenterology | Admitting: Gastroenterology

## 2023-07-10 DIAGNOSIS — F419 Anxiety disorder, unspecified: Secondary | ICD-10-CM | POA: Diagnosis not present

## 2023-07-10 DIAGNOSIS — D122 Benign neoplasm of ascending colon: Secondary | ICD-10-CM | POA: Insufficient documentation

## 2023-07-10 DIAGNOSIS — F17210 Nicotine dependence, cigarettes, uncomplicated: Secondary | ICD-10-CM | POA: Insufficient documentation

## 2023-07-10 DIAGNOSIS — J45909 Unspecified asthma, uncomplicated: Secondary | ICD-10-CM | POA: Diagnosis not present

## 2023-07-10 DIAGNOSIS — R195 Other fecal abnormalities: Secondary | ICD-10-CM

## 2023-07-10 DIAGNOSIS — E039 Hypothyroidism, unspecified: Secondary | ICD-10-CM | POA: Insufficient documentation

## 2023-07-10 DIAGNOSIS — I110 Hypertensive heart disease with heart failure: Secondary | ICD-10-CM | POA: Insufficient documentation

## 2023-07-10 DIAGNOSIS — D125 Benign neoplasm of sigmoid colon: Secondary | ICD-10-CM | POA: Insufficient documentation

## 2023-07-10 DIAGNOSIS — I341 Nonrheumatic mitral (valve) prolapse: Secondary | ICD-10-CM | POA: Insufficient documentation

## 2023-07-10 DIAGNOSIS — Z8673 Personal history of transient ischemic attack (TIA), and cerebral infarction without residual deficits: Secondary | ICD-10-CM | POA: Diagnosis not present

## 2023-07-10 DIAGNOSIS — I509 Heart failure, unspecified: Secondary | ICD-10-CM | POA: Diagnosis not present

## 2023-07-10 DIAGNOSIS — Z1211 Encounter for screening for malignant neoplasm of colon: Secondary | ICD-10-CM | POA: Insufficient documentation

## 2023-07-10 DIAGNOSIS — F32A Depression, unspecified: Secondary | ICD-10-CM | POA: Insufficient documentation

## 2023-07-10 HISTORY — PX: COLONOSCOPY WITH PROPOFOL: SHX5780

## 2023-07-10 HISTORY — PX: POLYPECTOMY: SHX149

## 2023-07-10 SURGERY — COLONOSCOPY WITH PROPOFOL
Anesthesia: General

## 2023-07-10 MED ORDER — LIDOCAINE HCL (CARDIAC) PF 100 MG/5ML IV SOSY
PREFILLED_SYRINGE | INTRAVENOUS | Status: DC | PRN
  Administered 2023-07-10: 100 mg via INTRAVENOUS

## 2023-07-10 MED ORDER — DEXMEDETOMIDINE HCL IN NACL 80 MCG/20ML IV SOLN
INTRAVENOUS | Status: DC | PRN
Start: 2023-07-10 — End: 2023-07-10
  Administered 2023-07-10: 12 ug via INTRAVENOUS

## 2023-07-10 MED ORDER — SODIUM CHLORIDE 0.9 % IV SOLN
INTRAVENOUS | Status: DC
  Administered 2023-07-10: 20 mL/h via INTRAVENOUS

## 2023-07-10 MED ORDER — PROPOFOL 10 MG/ML IV BOLUS
INTRAVENOUS | Status: DC | PRN
  Administered 2023-07-10: 150 ug/kg/min via INTRAVENOUS
  Administered 2023-07-10: 50 mg via INTRAVENOUS

## 2023-07-10 NOTE — Op Note (Signed)
[facility] Gastroenterology Patient Name: Michelle Conley Procedure Date: 07/10/2023 8:47 AM MRN: [id number] Account #: [id number] Date of Birth: [birth year] Admit Type: Outpatient Age: 52 Room: [facility] ENDO ROOM 4 Gender: Female Note Status: Finalized Instrument Name: Prentice Docker [id number] Procedure:             Colonoscopy Indications:           Screening for colorectal malignant neoplasm, This is                         the patient's first colonoscopy Providers:             Toney Reil MD, MD Medicines:             General Anesthesia Complications:         No immediate complications. Estimated blood loss: None. Procedure:             Pre-Anesthesia Assessment:                        - Prior to the procedure, a History and Physical was                         performed, and patient medications and allergies were                         reviewed. The patient is competent. The risks and                         benefits of the procedure and the sedation options and                         risks were discussed with the patient. All questions                         were answered and informed consent was obtained.                         Patient identification and proposed procedure were                         verified by the physician, the nurse, the                         anesthesiologist, the anesthetist and the technician                         in the pre-procedure area in the procedure room in the                         endoscopy suite. Mental Status Examination: alert and                         oriented. Airway Examination: normal oropharyngeal                         airway and neck mobility. Respiratory Examination:                         clear to auscultation.  CV Examination: normal.                         Prophylactic Antibiotics: The patient does not require                         prophylactic antibiotics. Prior Anticoagulants: The                          patient has taken no anticoagulant or antiplatelet                         agents. ASA Grade Assessment: III - A patient with                         severe systemic disease. After reviewing the risks and                         benefits, the patient was deemed in satisfactory                         condition to undergo the procedure. The anesthesia                         plan was to use general anesthesia. Immediately prior                         to administration of medications, the patient was                         re-assessed for adequacy to receive sedatives. The                         heart rate, respiratory rate, oxygen saturations,                         blood pressure, adequacy of pulmonary ventilation, and                         response to care were monitored throughout the                         procedure. The physical status of the patient was                         re-assessed after the procedure.                        After obtaining informed consent, the colonoscope was                         passed under direct vision. Throughout the procedure,                         the patient's blood pressure, pulse, and oxygen                         saturations were monitored continuously. The  Colonoscope was introduced through the anus and                         advanced to the the cecum, identified by appendiceal                         orifice and ileocecal valve. The colonoscopy was                         performed without difficulty. The patient tolerated                         the procedure well. The quality of the bowel                         preparation was evaluated using the BBPS [facility] Bowel                         Preparation Scale) with scores of: Right Colon = 3,                         Transverse Colon = 3 and Left Colon = 3 (entire mucosa                         seen well with no residual staining, small fragments                          of stool or opaque liquid). The total BBPS score                         equals 9. The ileocecal valve, appendiceal orifice,                         and rectum were photographed. Findings:      The perianal and digital rectal examinations were normal. Pertinent       negatives include normal sphincter tone and no palpable rectal lesions.      Two sessile polyps were found in the sigmoid colon and ascending colon.       The polyps were 5 to 8 mm in size. These polyps were removed with a cold       snare. Resection and retrieval were complete. Estimated blood loss: none.      The retroflexed view of the distal rectum and anal verge was normal and       showed no anal or rectal abnormalities. Impression:            - Two 5 to 8 mm polyps in the sigmoid colon and in the                         ascending colon, removed with a cold snare. Resected                         and retrieved.                        - The distal rectum and anal verge are normal on  retroflexion view. Recommendation:        - Discharge patient to home (with escort).                        - Resume previous diet today.                        - Continue present medications.                        - Await pathology results.                        - Repeat colonoscopy in 5 years for surveillance. Procedure Code(s):     --- Professional ---                        [phone], Colonoscopy, flexible; with removal of                         tumor(s), polyp(s), or other lesion(s) by snare                         technique Diagnosis Code(s):     --- Professional ---                        Z12.11, Encounter for screening for malignant neoplasm                         of colon                        D12.5, Benign neoplasm of sigmoid colon                        D12.2, Benign neoplasm of ascending colon CPT copyright 2022 [employer]. All rights reserved. The codes documented in  this report are preliminary and upon coder review may  be revised to meet current compliance requirements. Dr. Libby Maw Toney Reil MD, MD 07/10/2023 9:14:35 AM This report has been signed electronically. Number of Addenda: 0 Note Initiated On: 07/10/2023 8:47 AM Scope Withdrawal Time: 0 hours 8 minutes 33 seconds  Total Procedure Duration: 0 hours 12 minutes 4 seconds  Estimated Blood Loss:  Estimated blood loss: none.      [facility]

## 2023-07-10 NOTE — Anesthesia Preprocedure Evaluation (Signed)
Anesthesia Evaluation  Patient identified by MRN, date of birth, ID band Patient awake    Reviewed: Allergy & Precautions, NPO status , Patient's Chart, lab work & pertinent test results  History of Anesthesia Complications (+) PONV and history of anesthetic complications  Airway Mallampati: III  TM Distance: >3 FB Neck ROM: full    Dental  (+) Chipped, Dental Advidsory Given   Pulmonary shortness of breath and with exertion, asthma , Current Smoker and Patient abstained from smoking.   Pulmonary exam normal        Cardiovascular hypertension, +CHF  Normal cardiovascular exam+ Valvular Problems/Murmurs AI and MVP      Neuro/Psych  PSYCHIATRIC DISORDERS Anxiety Depression    CVA, No Residual Symptoms    GI/Hepatic negative GI ROS, Neg liver ROS,  Medicated,,  Endo/Other  Hypothyroidism    Renal/GU negative Renal ROS  negative genitourinary   Musculoskeletal   Abdominal   Peds  Hematology negative hematology ROS (+)   Anesthesia Other Findings Past Medical History: No date: Abnormal chest xray     Comment:  Reports that she has spots on her lungs No date: Anemia No date: Anxiety No date: Aortic valve disorder No date: Aortic valve insufficiency No date: Asthma No date: Cervical dysplasia No date: Complication of anesthesia     Comment:  reports that she comes out of it slowly No date: Cranial nerve palsy No date: Depression No date: GERD (gastroesophageal reflux disease) No date: Goiter No date: History of kidney stones No date: History of meningitis No date: History of viral encephalitis No date: Hypertension No date: Hypothyroidism No date: Migraine headache No date: Post traumatic stress disorder (PTSD) No date: Scarlet fever No date: Shortness of breath dyspnea No date: Stroke [facility]) No date: Syncope and collapse  Past Surgical History: No date: ABDOMINAL SURGERY 11/06/1995: CESAREAN  SECTION 11/05/2013: CHOLECYSTECTOMY 06/20/2015: LINGUAL FRENECTOMY; N/A     Comment:  Procedure: UPPER LABIAL L FRENECTOMY;  Surgeon:               Lincoln Brigham, DDS;  Location: [facility] OR;  Service: Oral              Surgery;  Laterality: N/A; 06/20/2015: MULTIPLE EXTRACTIONS WITH ALVEOLOPLASTY; N/A     Comment:  Procedure: EXTRACTION  OF TEETH NUMBERS 2-15,               19-30,ALVEOLOPLASTY OF MAXILLA  AND MANDIBLE;  Surgeon:               Lincoln Brigham, DDS;  Location: [facility] OR;  Service: Oral              Surgery;  Laterality: N/A; No date: NASAL SEPTUM SURGERY  BMI    Body Mass Index: 33.65 kg/m      Reproductive/Obstetrics negative OB ROS                             Anesthesia Physical Anesthesia Plan  ASA: 3  Anesthesia Plan: General   Post-op Pain Management: Minimal or no pain anticipated   Induction: Intravenous  PONV Risk Score and Plan: 3 and Propofol infusion, TIVA and Ondansetron  Airway Management Planned: Nasal Cannula  Additional Equipment: None  Intra-op Plan:   Post-operative Plan:   Informed Consent: I have reviewed the patients History and Physical, chart, labs and discussed the procedure including the risks, benefits and alternatives for the proposed anesthesia with the patient or authorized  representative who has indicated his/her understanding and acceptance.     Dental advisory given  Plan Discussed with: CRNA and Surgeon  Anesthesia Plan Comments: (Discussed risks of anesthesia with patient, including possibility of difficulty with spontaneous ventilation under anesthesia necessitating airway intervention, PONV, and rare risks such as cardiac or respiratory or neurological events, and allergic reactions. Discussed the role of CRNA in patient's perioperative care. Patient understands.)       Anesthesia Quick Evaluation

## 2023-07-10 NOTE — Transfer of Care (Signed)
Immediate Anesthesia Transfer of Care Note  Patient: Michelle Conley  Procedure(s) Performed: COLONOSCOPY WITH PROPOFOL POLYPECTOMY INTESTINAL  Patient Location: PACU  Anesthesia Type:General  Level of Consciousness: drowsy and patient cooperative  Airway & Oxygen Therapy: Patient Spontanous Breathing  Post-op Assessment: Report given to RN and Post -op Vital signs reviewed and stable  Post vital signs: stable  Last Vitals:  Vitals Value Taken Time  BP    Temp    Pulse 72 07/10/23 0918  Resp 20 07/10/23 0918  SpO2 95 % 07/10/23 0918  Vitals shown include unfiled device data.  Last Pain:  Vitals:   07/10/23 0730  TempSrc: Temporal  PainSc: 7          Complications: No notable events documented.

## 2023-07-10 NOTE — H&P (Signed)
Arlyss Repress, MD [address]  [address]  [place], [place] [address]  Main: [phone]  Fax: [phone] Pager: [phone]  Primary Care Physician:  Evelene Croon, MD Primary Gastroenterologist:  Dr. Arlyss Repress  Pre-Procedure History & Physical: HPI:  Michelle Conley is a 52 y.o. female is here for an colonoscopy.   Past Medical History:  Diagnosis Date   Abnormal chest xray    Reports that she has spots on her lungs   Anemia    Anxiety    Aortic valve disorder    Aortic valve insufficiency    Asthma    Cervical dysplasia    Complication of anesthesia    reports that she comes out of it slowly   Cranial nerve palsy    Depression    GERD (gastroesophageal reflux disease)    Goiter    History of kidney stones    History of meningitis    History of viral encephalitis    Hypertension    Hypothyroidism    Migraine headache    Post traumatic stress disorder (PTSD)    Scarlet fever    Shortness of breath dyspnea    Stroke [facility])    Syncope and collapse     Past Surgical History:  Procedure Laterality Date   ABDOMINAL SURGERY     CESAREAN SECTION  11/06/1995   CHOLECYSTECTOMY  11/05/2013   LINGUAL FRENECTOMY N/A 06/20/2015   Procedure: UPPER LABIAL L FRENECTOMY;  Surgeon: Lincoln Brigham, DDS;  Location: [facility] OR;  Service: Oral Surgery;  Laterality: N/A;   MULTIPLE EXTRACTIONS WITH ALVEOLOPLASTY N/A 06/20/2015   Procedure: EXTRACTION  OF TEETH NUMBERS 2-15, 19-30,ALVEOLOPLASTY OF MAXILLA  AND MANDIBLE;  Surgeon: Lincoln Brigham, DDS;  Location: [facility] OR;  Service: Oral Surgery;  Laterality: N/A;   NASAL SEPTUM SURGERY      Prior to Admission medications   Medication Sig Start Date End Date Taking? Authorizing Provider  albuterol (PROVENTIL HFA;VENTOLIN HFA) 108 (90 Base) MCG/ACT inhaler Inhale 1 puff into the lungs every 6 (six) hours as needed for wheezing or shortness of breath. 01/27/18  Yes Bridget Hartshorn L, PA-C  albuterol (PROVENTIL  HFA;VENTOLIN HFA) 108 (90 Base) MCG/ACT inhaler Inhale 2 puffs into the lungs every 6 (six) hours as needed for wheezing or shortness of breath. 01/05/19  Yes Darci Current, MD  alprazolam Prudy Feeler) 2 MG tablet Take 2 mg by mouth 4 (four) times daily.   Yes [provider]  atorvastatin (LIPITOR) 20 MG tablet Take by mouth.   Yes [provider]  azithromycin (ZITHROMAX) 250 MG tablet Take 1 tablet (250 mg total) by mouth daily. Take first 2 tablets together, then 1 every day until finished. 03/03/23  Yes Eusebio Friendly B, PA-C  EPINEPHrine 0.3 mg/0.3 mL IJ SOAJ injection Inject 0.3 mg into the muscle once as needed (allergic reaction).   Yes [provider]  escitalopram (LEXAPRO) 20 MG tablet Take 20 mg by mouth daily.   Yes [provider]  guaiFENesin-codeine (CHERATUSSIN AC) 100-10 MG/5ML syrup Take 10 mLs by mouth 3 (three) times daily as needed for cough or congestion. 03/03/23  Yes Shirlee Latch, PA-C  ibuprofen (ADVIL,MOTRIN) 800 MG tablet Take 800 mg by mouth every 8 (eight) hours as needed for mild pain or moderate pain.   Yes [provider]  levothyroxine (SYNTHROID, LEVOTHROID) 300 MCG tablet Take 300 mcg by mouth daily.   Yes [provider]  methocarbamol (ROBAXIN) 750 MG tablet Take by  mouth.   Yes [provider]  nortriptyline (PAMELOR) 25 MG capsule Take 25 mg by mouth daily. 03/16/21  Yes [provider]  omeprazole (PRILOSEC) 40 MG capsule Take 40 mg by mouth 2 (two) times daily.   Yes [provider]  Spacer/Aero-Holding Chambers (AEROCHAMBER MV) inhaler Use as instructed 06/21/21  Yes Becky Augusta, NP  tiotropium (SPIRIVA) 18 MCG inhalation capsule Place 18 mcg into inhaler and inhale daily.    Yes [provider]  beclomethasone (QVAR) 80 MCG/ACT inhaler Inhale 2 puffs into the lungs every 6 (six) hours as needed (wheezing or shortness of breath).    [provider]  simvastatin  (ZOCOR) 20 MG tablet Take 20 mg by mouth every evening.    [provider]  traZODone (DESYREL) 100 MG tablet Take 100 mg by mouth at bedtime as needed for sleep. Patient not taking: Reported on 07/10/2023    [provider]    Allergies as of 06/19/2023 - Review Complete 03/03/2023  Allergen Reaction Noted   Cyclobenzaprine Other (See Comments) 06/13/2015   Esomeprazole magnesium Other (See Comments) 06/13/2015   Metronidazole Other (See Comments) 06/13/2015   Mobic [meloxicam]  11/23/2016   Rofecoxib Other (See Comments) 06/13/2015   Toradol [ketorolac tromethamine] Other (See Comments) 01/05/2019   Tramadol Other (See Comments) 06/13/2015   Penicillins Nausea And Vomiting and Rash 06/13/2015    No family history on file.  Social History   Socioeconomic History   Marital status: Divorced    Spouse name: Not on file   Number of children: Not on file   Years of education: Not on file   Highest education level: Not on file  Occupational History   Not on file  Tobacco Use   Smoking status: Some Days    Current packs/day: 1.00    Average packs/day: 1 pack/day for 30.0 years (30.0 ttl pk-yrs)    Types: Cigarettes   Smokeless tobacco: Never  Vaping Use   Vaping status: Never Used  Substance and Sexual Activity   Alcohol use: No   Drug use: Yes    Types: Marijuana   Sexual activity: Not on file  Other Topics Concern   Not on file  Social History Narrative   Not on file   Social Determinants of Health   Financial Resource Strain: Low Risk  (09/10/2021)   Received from [facility], [facility]   Overall Financial Resource Strain (CARDIA)    Difficulty of Paying Living Expenses: Not very hard  Food Insecurity: No Food Insecurity (09/10/2021)   Received from [facility], [facility]   Hunger Vital Sign    Worried About Running Out of Food in the Last Year: Never true    Ran Out of Food in the Last Year: Never true  Transportation Needs: No  Transportation Needs (09/10/2021)   Received from [facility], [facility]   [facility] Transportation    Lack of Transportation (Medical): No    Lack of Transportation (Non-Medical): No  Physical Activity: Not on file  Stress: Not on file  Social Connections: Not on file  Intimate Partner Violence: Not on file    Review of Systems: See HPI, otherwise negative ROS  Physical Exam: BP 126/76   Pulse 93   Temp (!) 96 F (35.6 C) (Temporal)   Resp 20   Ht 4\' 10"  (1.473 m)   Wt 73 kg   LMP 06/02/2015   SpO2 96%   BMI  33.65 kg/m  General:   Alert,  pleasant and cooperative in NAD Head:  Normocephalic and atraumatic. Neck:  Supple; no masses or thyromegaly. Lungs:  Clear throughout to auscultation.    Heart:  Regular rate and rhythm. Abdomen:  Soft, nontender and nondistended. Normal bowel sounds, without guarding, and without rebound.   Neurologic:  Alert and  oriented x4;  grossly normal neurologically.  Impression/Plan: Michelle Conley is here for an colonoscopy to be performed for positive cologaurd  Risks, benefits, limitations, and alternatives regarding  colonoscopy have been reviewed with the patient.  Questions have been answered.  All parties agreeable.   Lannette Donath, MD  07/10/2023, 7:50 AM

## 2023-07-10 NOTE — Anesthesia Postprocedure Evaluation (Signed)
Anesthesia Post Note  Patient: Michelle Conley  Procedure(s) Performed: COLONOSCOPY WITH PROPOFOL POLYPECTOMY INTESTINAL  Patient location during evaluation: Endoscopy Anesthesia Type: General Level of consciousness: awake and alert Pain management: pain level controlled Vital Signs Assessment: post-procedure vital signs reviewed and stable Respiratory status: spontaneous breathing, nonlabored ventilation, respiratory function stable and patient connected to nasal cannula oxygen Cardiovascular status: blood pressure returned to baseline and stable Postop Assessment: no apparent nausea or vomiting Anesthetic complications: no  There were no known notable events for this encounter.   Last Vitals:  Vitals:   07/10/23 0730 07/10/23 0918  BP: 126/76 (!) 104/55  Pulse: 93 72  Resp: 20 20  Temp: (!) 35.6 C 36.4 C  SpO2: 96% 95%    Last Pain:  Vitals:   07/10/23 0918  TempSrc: Temporal  PainSc: Asleep                 Stephanie Coup

## 2023-07-11 ENCOUNTER — Encounter: Payer: Self-pay | Admitting: Gastroenterology

## 2023-07-15 ENCOUNTER — Encounter: Payer: Self-pay | Admitting: Gastroenterology

## 2023-07-19 ENCOUNTER — Telehealth: Payer: Self-pay

## 2023-07-19 NOTE — Telephone Encounter (Signed)
Called patient and patient verbalized understanding of colonoscopy results. She has not received the letter yet in the mail and with her anxiety and depression she has been worried about the results. Patient is relieved about the results.

## 2023-07-19 NOTE — Telephone Encounter (Signed)
Pt left a vm wanting to know the results of her recent colonoscopy. Please call her at [phone].

## 2023-12-03 DIAGNOSIS — R55 Syncope and collapse: Secondary | ICD-10-CM | POA: Insufficient documentation

## 2023-12-03 DIAGNOSIS — G039 Meningitis, unspecified: Secondary | ICD-10-CM | POA: Insufficient documentation

## 2023-12-03 DIAGNOSIS — J441 Chronic obstructive pulmonary disease with (acute) exacerbation: Secondary | ICD-10-CM | POA: Insufficient documentation

## 2023-12-03 DIAGNOSIS — I639 Cerebral infarction, unspecified: Secondary | ICD-10-CM | POA: Insufficient documentation

## 2023-12-03 DIAGNOSIS — I2510 Atherosclerotic heart disease of native coronary artery without angina pectoris: Secondary | ICD-10-CM | POA: Insufficient documentation

## 2023-12-03 DIAGNOSIS — E0580 Other thyrotoxicosis without thyrotoxic crisis or storm: Secondary | ICD-10-CM | POA: Insufficient documentation

## 2023-12-03 DIAGNOSIS — N879 Dysplasia of cervix uteri, unspecified: Secondary | ICD-10-CM | POA: Insufficient documentation

## 2023-12-03 DIAGNOSIS — A389 Scarlet fever, uncomplicated: Secondary | ICD-10-CM | POA: Insufficient documentation

## 2023-12-03 DIAGNOSIS — F32A Depression, unspecified: Secondary | ICD-10-CM | POA: Insufficient documentation

## 2023-12-03 DIAGNOSIS — J45909 Unspecified asthma, uncomplicated: Secondary | ICD-10-CM | POA: Insufficient documentation

## 2023-12-03 DIAGNOSIS — J449 Chronic obstructive pulmonary disease, unspecified: Secondary | ICD-10-CM | POA: Insufficient documentation

## 2023-12-03 DIAGNOSIS — I359 Nonrheumatic aortic valve disorder, unspecified: Secondary | ICD-10-CM | POA: Insufficient documentation

## 2023-12-03 DIAGNOSIS — E079 Disorder of thyroid, unspecified: Secondary | ICD-10-CM | POA: Insufficient documentation

## 2023-12-04 ENCOUNTER — Ambulatory Visit: Payer: Medicaid Other | Admitting: Family Medicine

## 2023-12-13 ENCOUNTER — Ambulatory Visit: Payer: Medicaid Other | Admitting: Family Medicine

## 2024-01-07 DIAGNOSIS — R002 Palpitations: Secondary | ICD-10-CM | POA: Insufficient documentation

## 2024-01-30 ENCOUNTER — Other Ambulatory Visit: Payer: Self-pay | Admitting: Family Medicine

## 2024-01-30 ENCOUNTER — Encounter: Payer: Self-pay | Admitting: Family Medicine

## 2024-01-30 ENCOUNTER — Ambulatory Visit (INDEPENDENT_AMBULATORY_CARE_PROVIDER_SITE_OTHER): Admitting: Family Medicine

## 2024-01-30 VITALS — BP 134/83 | HR 78 | Temp 98.3°F | Resp 18 | Ht <= 58 in | Wt 154.0 lb

## 2024-01-30 DIAGNOSIS — J418 Mixed simple and mucopurulent chronic bronchitis: Secondary | ICD-10-CM | POA: Diagnosis not present

## 2024-01-30 DIAGNOSIS — G8929 Other chronic pain: Secondary | ICD-10-CM

## 2024-01-30 DIAGNOSIS — I10 Essential (primary) hypertension: Secondary | ICD-10-CM | POA: Diagnosis not present

## 2024-01-30 DIAGNOSIS — E039 Hypothyroidism, unspecified: Secondary | ICD-10-CM

## 2024-01-30 DIAGNOSIS — I2510 Atherosclerotic heart disease of native coronary artery without angina pectoris: Secondary | ICD-10-CM

## 2024-01-30 DIAGNOSIS — E782 Mixed hyperlipidemia: Secondary | ICD-10-CM

## 2024-01-30 DIAGNOSIS — M546 Pain in thoracic spine: Secondary | ICD-10-CM | POA: Insufficient documentation

## 2024-01-30 DIAGNOSIS — Z0000 Encounter for general adult medical examination without abnormal findings: Secondary | ICD-10-CM | POA: Insufficient documentation

## 2024-01-30 DIAGNOSIS — K219 Gastro-esophageal reflux disease without esophagitis: Secondary | ICD-10-CM

## 2024-01-30 MED ORDER — ALBUTEROL SULFATE HFA 108 (90 BASE) MCG/ACT IN AERS: 2.0000 | INHALATION_SPRAY | Freq: Four times a day (QID) | RESPIRATORY_TRACT | 0 refills | Status: DC | PRN

## 2024-01-30 MED ORDER — TRELEGY ELLIPTA 100-62.5-25 MCG/ACT IN AEPB: 1.0000 | INHALATION_SPRAY | Freq: Every day | RESPIRATORY_TRACT | 11 refills | Status: DC

## 2024-01-30 MED ORDER — ICOSAPENT ETHYL 1 G PO CAPS: 4.0000 g | ORAL_CAPSULE | Freq: Two times a day (BID) | ORAL | 11 refills | Status: DC

## 2024-01-30 MED ORDER — FUROSEMIDE 20 MG PO TABS: 20.0000 mg | ORAL_TABLET | Freq: Every day | ORAL | 1 refills | Status: DC

## 2024-01-30 MED ORDER — LEVOTHYROXINE SODIUM 150 MCG PO TABS: 150.0000 ug | ORAL_TABLET | Freq: Every day | ORAL | 3 refills | Status: DC

## 2024-01-30 MED ORDER — ATORVASTATIN CALCIUM 80 MG PO TABS: 80.0000 mg | ORAL_TABLET | Freq: Every day | ORAL | 3 refills | Status: DC

## 2024-01-30 MED ORDER — METHOCARBAMOL 750 MG PO TABS
1500.0000 mg | ORAL_TABLET | Freq: Three times a day (TID) | ORAL | 3 refills | Status: DC
Start: 2024-01-30 — End: 2024-02-17

## 2024-01-30 NOTE — Assessment & Plan Note (Signed)
 Ask her to get DEXA and mammogram.  She would prefer to go to [facility] imaging.

## 2024-01-30 NOTE — Assessment & Plan Note (Signed)
 Reconciled her medications.  She is supposed to be on Lipitor 80 and not Zocor.  Checking her labs today.

## 2024-01-30 NOTE — Addendum Note (Signed)
 Addended by: Alease Medina on: 01/30/2024 11:30 AM   Modules accepted: Orders

## 2024-01-30 NOTE — Assessment & Plan Note (Signed)
 Reconciled her medications.  Encouraged her to use Trelegy daily and albuterol as needed.  Ask her to stop Symbicort, Spiriva and DuoNebs.

## 2024-01-30 NOTE — Progress Notes (Addendum)
 New Patient Office Visit  Subjective    Patient ID: [name], female    DOB: 04-24-1971  Age: 53 y.o. MRN: [id number]  CC:  Chief Complaint  Patient presents with   Establish Care    HPI Michelle Conley presents to establish care.  Delightful 53 year old with HTN, hypothyroidism, aortic insufficiency (Dr. Madaline Guthrie), COPD (Dr. Karna Christmas), chronic headaches (Dr. Hale Bogus), anxiety, adenomatous colon polyps, nicotine addiction, anxiety and depression (Dr. Fannie Knee), at bedtime CVA, s/p hysterectomy and s/p choleycystectomy. She was experiencing palpitations and had a 72-hour Holter monitor which was benign.  She was started on gabapentin for her headaches but advises that the gabapentin makes her feel like she is on edge and cannot relax. She still smoking and has a chronic cough especially at night.  She has Trelegy but is not currently using it she is on Symbicort and Spiriva.  She also has a prescription for DuoNebs.  She has duplicity in her pulmonary medications She complains of thoracic back pain.  She has been taking 800 mg of ibuprofen sometimes 2 at a time.  She does not want Suboxone or methadone.  She wants an opioid but she is not willing to go to pain management.  She has been taking methocarbamol 750 twice daily.  Outpatient Encounter Medications as of 01/30/2024  Medication Sig   acetaminophen (TYLENOL) 500 MG tablet Take by mouth.   alprazolam (XANAX) 2 MG tablet Take 2 mg by mouth 4 (four) times daily.   ARIPiprazole (ABILIFY) 5 MG tablet Take by mouth.   budesonide-formoterol (SYMBICORT) 160-4.5 MCG/ACT inhaler INHALE 2 PUFFS BY MOUTH TWICE DAILY (USE IN THE MORNING AND EVENING)   DUPIXENT 300 MG/2ML SOAJ Inject into the skin.   EPINEPHrine 0.3 mg/0.3 mL IJ SOAJ injection Inject 0.3 mg into the muscle once as needed (allergic reaction).   escitalopram (LEXAPRO) 20 MG tablet Take 20 mg by mouth daily.   hydrOXYzine (ATARAX) 25 MG tablet TAKE 1 TABLET BY MOUTH ONCE DAILY AT  NIGHT AT BEDTIME   ibuprofen (ADVIL,MOTRIN) 800 MG tablet Take 800 mg by mouth every 8 (eight) hours as needed for mild pain or moderate pain.   ipratropium-albuterol (DUONEB) 0.5-2.5 (3) MG/3ML SOLN Inhale into the lungs.   methocarbamol (ROBAXIN) 750 MG tablet Take 2 tablets (1,500 mg total) by mouth 3 (three) times daily.   montelukast (SINGULAIR) 10 MG tablet Take 1 tablet by mouth at bedtime.   Na Sulfate-K Sulfate-Mg Sulfate concentrate 17.5-3.13-1.6 GM/177ML SOLN Take by mouth.   naloxone (NARCAN) nasal spray 4 mg/0.1 mL Call 911. Administer a single spray in one nostril. If no or minimal response after 2 to 3 minutes, an additional dose may be given in the alternate nostril.   nortriptyline (PAMELOR) 50 MG capsule Take 50 mg by mouth daily.   omeprazole (PRILOSEC) 20 MG capsule Take 20 mg by mouth daily.   promethazine (PHENERGAN) 25 MG tablet Take 25 mg by mouth every 6 (six) hours as needed.   simvastatin (ZOCOR) 20 MG tablet Take 20 mg by mouth every evening.   Spacer/Aero-Holding Chambers (AEROCHAMBER MV) inhaler Use as instructed   tiotropium (SPIRIVA) 18 MCG inhalation capsule Place 18 mcg into inhaler and inhale daily.    [DISCONTINUED] albuterol (PROVENTIL HFA;VENTOLIN HFA) 108 (90 Base) MCG/ACT inhaler Inhale 1 puff into the lungs every 6 (six) hours as needed for wheezing or shortness of breath.   [DISCONTINUED] albuterol (PROVENTIL HFA;VENTOLIN HFA) 108 (90 Base) MCG/ACT inhaler Inhale 2 puffs into the  lungs every 6 (six) hours as needed for wheezing or shortness of breath.   [DISCONTINUED] atorvastatin (LIPITOR) 80 MG tablet Take 80 mg by mouth daily.   [DISCONTINUED] Fluticasone-Umeclidin-Vilant (TRELEGY ELLIPTA) 100-62.5-25 MCG/ACT AEPB Inhale into the lungs.   [DISCONTINUED] furosemide (LASIX) 20 MG tablet Take by mouth.   [DISCONTINUED] icosapent Ethyl (VASCEPA) 1 g capsule TAKE 2 CAPSULES BY MOUTH TWICE DAILY WITH FOOD (SWALLOW WHOLE. DO NOT CHEW, OPEN, DISSOLVE, AND/OR  CRUSH)   [DISCONTINUED] levothyroxine (SYNTHROID) 150 MCG tablet Take 150 mcg by mouth daily.   [DISCONTINUED] methocarbamol (ROBAXIN) 750 MG tablet Take by mouth.   albuterol (VENTOLIN HFA) 108 (90 Base) MCG/ACT inhaler Inhale 2 puffs into the lungs every 6 (six) hours as needed for wheezing or shortness of breath.   atorvastatin (LIPITOR) 80 MG tablet Take 1 tablet (80 mg total) by mouth daily.   budesonide-formoterol (SYMBICORT) 160-4.5 MCG/ACT inhaler Inhale into the lungs.   buPROPion ER (WELLBUTRIN SR) 100 MG 12 hr tablet Take 100 mg by mouth 2 (two) times daily. (Patient not taking: Reported on 01/30/2024)   clobetasol (TEMOVATE) 0.05 % external solution Apply topically daily. (Patient not taking: Reported on 01/30/2024)   Fluticasone-Umeclidin-Vilant (TRELEGY ELLIPTA) 100-62.5-25 MCG/ACT AEPB Inhale 1 Inhalation into the lungs daily.   furosemide (LASIX) 20 MG tablet Take 1 tablet (20 mg total) by mouth daily.   Ibuprofen-Acetaminophen 125-250 MG TABS Take by mouth.   icosapent Ethyl (VASCEPA) 1 g capsule Take 4 capsules (4 g total) by mouth 2 (two) times daily.   levothyroxine (SYNTHROID) 150 MCG tablet Take 1 tablet (150 mcg total) by mouth daily.   nicotine (NICODERM CQ - DOSED IN MG/24 HOURS) 21 mg/24hr patch Place onto the skin. (Patient not taking: Reported on 01/30/2024)   [DISCONTINUED] albuterol (VENTOLIN HFA) 108 (90 Base) MCG/ACT inhaler Inhale 2 puffs into the lungs every 6 (six) hours as needed for wheezing or shortness of breath.   [DISCONTINUED] atorvastatin (LIPITOR) 20 MG tablet Take by mouth.   [DISCONTINUED] azithromycin (ZITHROMAX) 250 MG tablet Take 1 tablet (250 mg total) by mouth daily. Take first 2 tablets together, then 1 every day until finished.   [DISCONTINUED] beclomethasone (QVAR) 80 MCG/ACT inhaler Inhale 2 puffs into the lungs every 6 (six) hours as needed (wheezing or shortness of breath).   [DISCONTINUED] doxycycline (VIBRA-TABS) 100 MG tablet Take 100 mg by  mouth 2 (two) times daily.   [DISCONTINUED] doxycycline (VIBRAMYCIN) 100 MG capsule Take 100 mg by mouth 2 (two) times daily.   [DISCONTINUED] Dupilumab (DUPIXENT) 300 MG/2ML SOPN    [DISCONTINUED] escitalopram (LEXAPRO) 10 MG tablet Take 10 mg by mouth daily.   [DISCONTINUED] gabapentin (NEURONTIN) 100 MG capsule Take 100 mg twice daily for one week, then increase to 200 mg twice daily for one week and then increase to 300 mg twice daily   [DISCONTINUED] guaiFENesin-codeine (CHERATUSSIN AC) 100-10 MG/5ML syrup Take 10 mLs by mouth 3 (three) times daily as needed for cough or congestion.   [DISCONTINUED] HYDROcodone bit-homatropine (HYCODAN) 5-1.5 MG/5ML syrup Take 5 mLs by mouth every 6 (six) hours as needed.   [DISCONTINUED] ipratropium-albuterol (DUONEB) 0.5-2.5 (3) MG/3ML SOLN Inhale into the lungs.   [DISCONTINUED] levothyroxine (SYNTHROID, LEVOTHROID) 300 MCG tablet Take 300 mcg by mouth daily.   [DISCONTINUED] omeprazole (PRILOSEC) 40 MG capsule Take 40 mg by mouth 2 (two) times daily.   [DISCONTINUED] predniSONE (DELTASONE) 20 MG tablet Take 20 mg by mouth 2 (two) times daily.   [DISCONTINUED] traZODone (DESYREL) 100 MG tablet  Take 100 mg by mouth at bedtime as needed for sleep. (Patient not taking: Reported on 07/10/2023)   No facility-administered encounter medications on file as of 01/30/2024.    Past Medical History:  Diagnosis Date   Abnormal chest xray    Reports that she has spots on her lungs   Anemia    Anxiety    Aortic valve disorder    Aortic valve insufficiency    Asthma    Cervical dysplasia    Complication of anesthesia    reports that she comes out of it slowly   COPD (chronic obstructive pulmonary disease) (HCC)    Cranial nerve palsy    Depression    GERD (gastroesophageal reflux disease)    Goiter    History of kidney stones    History of meningitis    History of viral encephalitis    Hypertension    Hypothyroidism    Migraine headache    Post traumatic  stress disorder (PTSD)    Scarlet fever    Shortness of breath dyspnea    Stroke [facility])    Syncope and collapse     Past Surgical History:  Procedure Laterality Date   ABDOMINAL SURGERY     CESAREAN SECTION  11/06/1995   CHOLECYSTECTOMY  11/05/2013   COLONOSCOPY WITH PROPOFOL N/A 07/10/2023   Procedure: COLONOSCOPY WITH PROPOFOL;  Surgeon: Toney Reil, MD;  Location: [facility] ENDOSCOPY;  Service: Gastroenterology;  Laterality: N/A;   LINGUAL FRENECTOMY N/A 06/20/2015   Procedure: UPPER LABIAL L FRENECTOMY;  Surgeon: Lincoln Brigham, DDS;  Location: [facility] OR;  Service: Oral Surgery;  Laterality: N/A;   MULTIPLE EXTRACTIONS WITH ALVEOLOPLASTY N/A 06/20/2015   Procedure: EXTRACTION  OF TEETH NUMBERS 2-15, 19-30,ALVEOLOPLASTY OF MAXILLA  AND MANDIBLE;  Surgeon: Lincoln Brigham, DDS;  Location: [facility] OR;  Service: Oral Surgery;  Laterality: N/A;   NASAL SEPTUM SURGERY     POLYPECTOMY  07/10/2023   Procedure: POLYPECTOMY INTESTINAL;  Surgeon: Toney Reil, MD;  Location: [facility] ENDOSCOPY;  Service: Gastroenterology;;    Family History  Problem Relation Age of Onset   Lung cancer Mother    Esophageal cancer Mother    Diabetes Maternal Grandmother    Stroke Maternal Grandmother     Social History   Socioeconomic History   Marital status: Divorced    Spouse name: Not on file   Number of children: Not on file   Years of education: Not on file   Highest education level: Not on file  Occupational History   Not on file  Tobacco Use   Smoking status: Some Days    Current packs/day: 1.00    Average packs/day: 1 pack/day for 30.0 years (30.0 ttl pk-yrs)    Types: Cigarettes    Passive exposure: Current   Smokeless tobacco: Never  Vaping Use   Vaping status: Never Used  Substance and Sexual Activity   Alcohol use: No   Drug use: Yes    Types: Marijuana   Sexual activity: Not on file  Other Topics Concern   Not on file  Social History Narrative   Not on file   Social  Drivers of Health   Financial Resource Strain: Low Risk  (09/10/2021)   Received from [facility], [facility]   Overall Financial Resource Strain (CARDIA)    Difficulty of Paying Living Expenses: Not very hard  Food Insecurity: No Food Insecurity (09/10/2021)   Received from [facility], [facility]   Hunger Vital Sign  Worried About Programme researcher, broadcasting/film/video in the Last Year: Never true    Ran Out of Food in the Last Year: Never true  Transportation Needs: No Transportation Needs (09/10/2021)   Received from [facility], [facility]   [facility] - Transportation    Lack of Transportation (Medical): No    Lack of Transportation (Non-Medical): No  Physical Activity: Not on file  Stress: Not on file  Social Connections: Not on file  Intimate Partner Violence: Not on file    ROS      Objective   BP 134/83 (BP Location: Left Arm, Patient Position: Sitting, Cuff Size: Normal)   Pulse 78   Temp 98.3 F (36.8 C) (Oral)   Resp 18   Ht 4\' 10"  (1.473 m)   Wt 154 lb (69.9 kg)   LMP 06/02/2015   SpO2 93%   BMI 32.19 kg/m    Physical Exam Vitals and nursing note reviewed.  Constitutional:      Appearance: Normal appearance.  HENT:     Head: Normocephalic and atraumatic.  Eyes:     Conjunctiva/sclera: Conjunctivae normal.  Cardiovascular:     Rate and Rhythm: Normal rate and regular rhythm.  Pulmonary:     Effort: Pulmonary effort is normal.     Breath sounds: Normal breath sounds.  Musculoskeletal:     Right lower leg: No edema.     Left lower leg: No edema.  Skin:    General: Skin is warm and dry.  Neurological:     Mental Status: She is alert and oriented to person, place, and time.  Psychiatric:        Mood and Affect: Mood normal.        Behavior: Behavior normal.        Thought Content: Thought content normal.        Judgment: Judgment normal.            The ASCVD Risk score (Arnett DK, et al., 2019) failed to calculate for the  following reasons:   Risk score cannot be calculated because patient has a medical history suggesting prior/existing ASCVD     Assessment & Plan:  Benign essential hypertension -     Furosemide; Take 1 tablet (20 mg total) by mouth daily.  Dispense: 30 tablet; Refill: 1 -     CBC with Differential/Platelet -     Comprehensive metabolic panel with GFR  Mixed simple and mucopurulent chronic bronchitis (HCC) Assessment & Plan: Reconciled her medications.  Encouraged her to use Trelegy daily and albuterol as needed.  Ask her to stop Symbicort, Spiriva and DuoNebs.  Orders: -     Trelegy Ellipta; Inhale 1 Inhalation into the lungs daily.  Dispense: 1 each; Refill: 11 -     Albuterol Sulfate HFA; Inhale 2 puffs into the lungs every 6 (six) hours as needed for wheezing or shortness of breath.  Dispense: 1 each; Refill: 0  Coronary artery disease involving native coronary artery of native heart without angina pectoris  Mixed hyperlipidemia Assessment & Plan: Reconciled her medications.  She is supposed to be on Lipitor 80 and not Zocor.  Checking her labs today.  Orders: -     Atorvastatin Calcium; Take 1 tablet (80 mg total) by mouth daily.  Dispense: 90 tablet; Refill: 3 -     Icosapent Ethyl; Take 4 capsules (4 g total) by mouth 2 (two) times daily.  Dispense: 120 capsule; Refill: 11 -     Lipid panel  Acquired hypothyroidism Assessment & Plan: She is on levothyroxine 150 mcg daily.  Checking her thyroid today  Orders: -     Levothyroxine Sodium; Take 1 tablet (150 mcg total) by mouth daily.  Dispense: 90 tablet; Refill: 3 -     TSH + free T4; Future  Chronic bilateral thoracic back pain Assessment & Plan: Methocarbamol 750 TID and IBU 1600mg  TID is not helping her pain.  Asked her to try methocarbamol 1500mg  TID.  Please only take IBU 800mg  TID, ore than this can damage your kidneys.   Getting a bone density scan.    Orders: -     Methocarbamol; Take 2 tablets (1,500 mg total)  by mouth 3 (three) times daily.  Dispense: 180 tablet; Refill: 3  Gastroesophageal reflux disease, unspecified whether esophagitis present  Healthcare maintenance Assessment & Plan: Ask her to get DEXA and mammogram.  She would prefer to go to [facility] imaging.  Orders: -     DG Bone Density; Future -     3D Screening Mammogram, Left and Right; Future    Return in about 3 months (around 05/01/2024).   Alease Medina, MD

## 2024-01-30 NOTE — Assessment & Plan Note (Signed)
 She is on levothyroxine 150 mcg daily.  Checking her thyroid today

## 2024-01-30 NOTE — Assessment & Plan Note (Signed)
 Methocarbamol 750 TID and IBU 1600mg  TID is not helping her pain.  Asked her to try methocarbamol 1500mg  TID.  Please only take IBU 800mg  TID, ore than this can damage your kidneys.   Getting a bone density scan.

## 2024-01-31 ENCOUNTER — Telehealth: Payer: Self-pay

## 2024-01-31 LAB — COMPREHENSIVE METABOLIC PANEL WITH GFR
ALT: 10 IU/L (ref 0–32)
AST: 13 IU/L (ref 0–40)
Albumin: 4.2 g/dL (ref 3.8–4.9)
Alkaline Phosphatase: 103 IU/L (ref 44–121)
BUN/Creatinine Ratio: 13 (ref 9–23)
BUN: 10 mg/dL (ref 6–24)
Bilirubin Total: 0.2 mg/dL (ref 0.0–1.2)
CO2: 21 mmol/L (ref 20–29)
Calcium: 9.1 mg/dL (ref 8.7–10.2)
Chloride: 103 mmol/L (ref 96–106)
Creatinine, Ser: 0.8 mg/dL (ref 0.57–1.00)
Globulin, Total: 2.4 g/dL (ref 1.5–4.5)
Glucose: 85 mg/dL (ref 70–99)
Potassium: 3.7 mmol/L (ref 3.5–5.2)
Sodium: 139 mmol/L (ref 134–144)
Total Protein: 6.6 g/dL (ref 6.0–8.5)
eGFR: 89 mL/min/{1.73_m2} (ref >59)

## 2024-01-31 LAB — CBC WITH DIFFERENTIAL/PLATELET
Basophils Absolute: 0.1 10*3/uL (ref 0.0–0.2)
Basos: 1 %
EOS (ABSOLUTE): 0.1 10*3/uL (ref 0.0–0.4)
Eos: 1 %
Hematocrit: 42.1 % (ref 34.0–46.6)
Hemoglobin: 13.8 g/dL (ref 11.1–15.9)
Immature Grans (Abs): 0.1 10*3/uL (ref 0.0–0.1)
Immature Granulocytes: 1 %
Lymphocytes Absolute: 2.5 10*3/uL (ref 0.7–3.1)
Lymphs: 29 %
MCH: 30.5 pg (ref 26.6–33.0)
MCHC: 32.8 g/dL (ref 31.5–35.7)
MCV: 93 fL (ref 79–97)
Monocytes Absolute: 0.4 10*3/uL (ref 0.1–0.9)
Monocytes: 4 %
Neutrophils Absolute: 5.5 10*3/uL (ref 1.4–7.0)
Neutrophils: 64 %
Platelets: 313 10*3/uL (ref 150–450)
RBC: 4.52 x10E6/uL (ref 3.77–5.28)
RDW: 11.6 % — ABNORMAL LOW (ref 11.7–15.4)
WBC: 8.6 10*3/uL (ref 3.4–10.8)

## 2024-01-31 LAB — LIPID PANEL
Chol/HDL Ratio: 4.2 ratio (ref 0.0–4.4)
Cholesterol, Total: 160 mg/dL (ref 100–199)
HDL: 38 mg/dL — ABNORMAL LOW (ref >39)
LDL Chol Calc (NIH): 84 mg/dL (ref 0–99)
Triglycerides: 228 mg/dL — ABNORMAL HIGH (ref 0–149)
VLDL Cholesterol Cal: 38 mg/dL (ref 5–40)

## 2024-01-31 LAB — TSH: TSH: 2.32 u[IU]/mL (ref 0.450–4.500)

## 2024-01-31 NOTE — Telephone Encounter (Signed)
(  Key: [id number])  PA Case ID #: [id number]  Rx #: [id number]  Status  sent iconSent to Plan today  Drug  Icosapent Ethyl 1GM capsules  Form [employer] Commercial / Chief of Staff Prior

## 2024-02-03 ENCOUNTER — Telehealth: Payer: Self-pay

## 2024-02-03 NOTE — Telephone Encounter (Signed)
 Copied from CRM [id number]. Topic: Clinical - Lab/Test Results >> Feb 03, 2024 10:29 AM Elle L wrote: Reason for CRM: The patient returned the call regarding her labs. I read the note to her verbatim and she expressed understanding.

## 2024-02-04 ENCOUNTER — Other Ambulatory Visit: Payer: Self-pay

## 2024-02-04 NOTE — Telephone Encounter (Signed)
 She has not had pamelor in two years.  She is taking Lexapro and Aripiprazole already.  Do not feel comfortable adding back in pamelor.

## 2024-02-05 ENCOUNTER — Other Ambulatory Visit: Payer: Self-pay | Admitting: Family Medicine

## 2024-02-05 DIAGNOSIS — E782 Mixed hyperlipidemia: Secondary | ICD-10-CM

## 2024-02-05 DIAGNOSIS — J418 Mixed simple and mucopurulent chronic bronchitis: Secondary | ICD-10-CM

## 2024-02-05 DIAGNOSIS — G8929 Other chronic pain: Secondary | ICD-10-CM

## 2024-02-05 DIAGNOSIS — I10 Essential (primary) hypertension: Secondary | ICD-10-CM

## 2024-02-05 NOTE — Telephone Encounter (Signed)
 Copied from CRM [id number]. Topic: Clinical - Medication Refill >> Feb 05, 2024  9:18 AM Geroge Baseman wrote: Most Recent Primary Care Visit:  Provider: Alease Medina  Department: [facility]  Visit Type: NEW PT - OFFICE VISIT  Date: 01/30/2024  Medication: acetaminophen-ibuprofen 250 MG, Spacer/Aero-Holding Chambers (AEROCHAMBER MV) inhaler, albuterol (VENTOLIN HFA) 108 (90 Base) MCG/ACT inhaler, omeprazole (PRILOSEC) 20 MG capsule, atorvastatin (LIPITOR) 80 MG table, furosemide (LASIX) 20 MG table, ibuprofen (ADVIL,MOTRIN) 800 MG tablet, icosapent Ethyl (VASCEPA) 1 g capsule, methocarbamol (ROBAXIN) 750 MG tablet, montelukast (SINGULAIR) 10 MG tablet, naloxone (NARCAN) nasal spray 4 mg/0.1 mL, nortriptyline (PAMELOR) 50 MG capsule, omeprazole (PRILOSEC) 20 MG capsule, promethazine (PHENERGAN) 25 MG table, simvastatin (ZOCOR) 20 MG tablet, budesonide-formoterol (SYMBICORT) 160-4.5 MCG/ACT inhaler, tiotropium (SPIRIVA) 18 MCG inhalation capsule, Fluticasone-Umeclidin-Vilant (TRELEGY ELLIPTA) 100-62.5-25 MCG/ACT AEPB.  Has the patient contacted their pharmacy? Yes   Is this the correct pharmacy for this prescription? Yes If no, delete pharmacy and type the correct one.  This is the patient's preferred pharmacy:   [facility] [address], [place] - [address] [place] [address] Phone: [phone] Fax: [phone]   Has the prescription been filled recently? Yes  Is the patient out of the medication? Yes  Has the patient been seen for an appointment in the last year OR does the patient have an upcoming appointment? Yes  Can we respond through MyChart? No  Agent: Please be advised that Rx refills may take up to 3 business days. We ask that you follow-up with your pharmacy.

## 2024-02-05 NOTE — Telephone Encounter (Signed)
 Called and spoke with Harriett Sine at [facility], she confirmed prescriptions (ventolin, lipitor, trelegy ellipta, lasix, synthroid, robaxin) are on file as of 01/30/24. She states they were placed on hold due to the patient did not update her insurance with them. Prior authorization in progress on Vascepa. Cancelled reorder on medications.

## 2024-02-06 ENCOUNTER — Other Ambulatory Visit: Payer: Self-pay | Admitting: Family Medicine

## 2024-02-06 DIAGNOSIS — J418 Mixed simple and mucopurulent chronic bronchitis: Secondary | ICD-10-CM

## 2024-02-06 DIAGNOSIS — E782 Mixed hyperlipidemia: Secondary | ICD-10-CM

## 2024-02-13 ENCOUNTER — Other Ambulatory Visit: Payer: Self-pay | Admitting: Family Medicine

## 2024-02-13 NOTE — Telephone Encounter (Signed)
 Copied from CRM [id number]. Topic: Clinical - Medication Refill >> Feb 13, 2024 10:26 AM Lebron Quam wrote: Most Recent Primary Care Visit:  Provider: Alease Medina  Department: [facility]  Visit Type: NEW PT - OFFICE VISIT  Date: 01/30/2024  Medication: methocarbamol 1,500 mg  simvastin 20mg   800 IBPROPHEN promethazine (PHENERGAN) 25 MG tablet  icosapent Ethyl (VASCEPA) 1 g capsule  albuterol (VENTOLIN HFA) 108 (90 Base) MCG/ACT inhaler  Spacer/Aero-Holding Chambers (AEROCHAMBER MV) inhaler    Has the patient contacted their pharmacy? Yes (Agent: If no, request that the patient contact the pharmacy for the refill. If patient does not wish to contact the pharmacy document the reason why and proceed with request.) (Agent: If yes, when and what did the pharmacy advise?) to contact the doctor's office  Is this the correct pharmacy for this prescription? Yes If no, delete pharmacy and type the correct one.  This is the patient's preferred pharmacy:  [facility] [address], [place] - [address] [place] [address] Phone: [phone] Fax: [phone]   Has the prescription been filled recently? Yes  Is the patient out of the medication? No  Has the patient been seen for an appointment in the last year OR does the patient have an upcoming appointment? Yes  Can we respond through MyChart? No  Agent: Please be advised that Rx refills may take up to 3 business days. We ask that you follow-up with your pharmacy.

## 2024-02-13 NOTE — Telephone Encounter (Signed)
 Duplicate request, medications requested and refused on 02/05/24 and 02/06/24. Cancelled Reorder. Per previous refill request note, Vascepa requires prior authorization and is in progress. Closing encounter.

## 2024-02-17 ENCOUNTER — Telehealth: Payer: Self-pay

## 2024-02-17 ENCOUNTER — Other Ambulatory Visit: Payer: Self-pay | Admitting: Family Medicine

## 2024-02-17 ENCOUNTER — Telehealth: Payer: Self-pay | Admitting: *Deleted

## 2024-02-17 DIAGNOSIS — R110 Nausea: Secondary | ICD-10-CM

## 2024-02-17 DIAGNOSIS — I10 Essential (primary) hypertension: Secondary | ICD-10-CM

## 2024-02-17 DIAGNOSIS — M546 Pain in thoracic spine: Secondary | ICD-10-CM

## 2024-02-17 DIAGNOSIS — E782 Mixed hyperlipidemia: Secondary | ICD-10-CM

## 2024-02-17 DIAGNOSIS — G8929 Other chronic pain: Secondary | ICD-10-CM

## 2024-02-17 DIAGNOSIS — J418 Mixed simple and mucopurulent chronic bronchitis: Secondary | ICD-10-CM

## 2024-02-17 MED ORDER — TRELEGY ELLIPTA 100-62.5-25 MCG/ACT IN AEPB: 1.0000 | INHALATION_SPRAY | Freq: Every day | RESPIRATORY_TRACT | 11 refills | Status: DC

## 2024-02-17 MED ORDER — PROMETHAZINE HCL 25 MG PO TABS: 25.0000 mg | ORAL_TABLET | Freq: Four times a day (QID) | ORAL | 0 refills | Status: DC | PRN

## 2024-02-17 MED ORDER — AEROCHAMBER MV MISC: 2 refills | Status: DC

## 2024-02-17 MED ORDER — SIMVASTATIN 20 MG PO TABS
20.0000 mg | ORAL_TABLET | Freq: Every evening | ORAL | 0 refills | Status: DC
Start: 2024-02-17 — End: 2024-05-18

## 2024-02-17 MED ORDER — NORTRIPTYLINE HCL 50 MG PO CAPS: 50.0000 mg | ORAL_CAPSULE | Freq: Every day | ORAL | 0 refills | Status: DC

## 2024-02-17 MED ORDER — FUROSEMIDE 20 MG PO TABS: 20.0000 mg | ORAL_TABLET | Freq: Every day | ORAL | 1 refills | Status: DC

## 2024-02-17 MED ORDER — ICOSAPENT ETHYL 1 G PO CAPS: 4.0000 g | ORAL_CAPSULE | Freq: Two times a day (BID) | ORAL | 11 refills | Status: AC

## 2024-02-17 MED ORDER — TIOTROPIUM BROMIDE MONOHYDRATE 18 MCG IN CAPS: 18.0000 ug | ORAL_CAPSULE | Freq: Every day | RESPIRATORY_TRACT | 1 refills | Status: DC

## 2024-02-17 MED ORDER — BUDESONIDE-FORMOTEROL FUMARATE 160-4.5 MCG/ACT IN AERO: 2.0000 | INHALATION_SPRAY | Freq: Two times a day (BID) | RESPIRATORY_TRACT | 3 refills | Status: DC

## 2024-02-17 MED ORDER — ALBUTEROL SULFATE HFA 108 (90 BASE) MCG/ACT IN AERS: 2.0000 | INHALATION_SPRAY | Freq: Four times a day (QID) | RESPIRATORY_TRACT | 0 refills | Status: DC | PRN

## 2024-02-17 MED ORDER — METHOCARBAMOL 750 MG PO TABS: 1500.0000 mg | ORAL_TABLET | Freq: Three times a day (TID) | ORAL | 3 refills | Status: DC

## 2024-02-17 MED ORDER — AEROCHAMBER MV MISC: 2 refills | Status: AC

## 2024-02-17 MED ORDER — OMEPRAZOLE 20 MG PO CPDR: 20.0000 mg | DELAYED_RELEASE_CAPSULE | Freq: Every day | ORAL | 0 refills | Status: DC

## 2024-02-17 MED ORDER — MONTELUKAST SODIUM 10 MG PO TABS: 10.0000 mg | ORAL_TABLET | Freq: Every day | ORAL | 0 refills | Status: DC

## 2024-02-17 MED ORDER — IBUPROFEN 800 MG PO TABS: 800.0000 mg | ORAL_TABLET | Freq: Three times a day (TID) | ORAL | 1 refills | Status: DC | PRN

## 2024-02-17 MED ORDER — ATORVASTATIN CALCIUM 80 MG PO TABS: 80.0000 mg | ORAL_TABLET | Freq: Every day | ORAL | 3 refills | Status: DC

## 2024-02-17 NOTE — Telephone Encounter (Signed)
 Copied from CRM [id number]. Topic: Clinical - Medication Refill >> Feb 13, 2024 10:26 AM Zina Hilts wrote: Most Recent Primary Care Visit:  Provider: ZIGLAR, SUSAN K  Department: [facility]  Visit Type: NEW PT - OFFICE VISIT  Date: 01/30/2024  Medication: methocarbamol 1,500 mg  simvastin 20mg   800 IBPROPHEN promethazine (PHENERGAN) 25 MG tablet  icosapent Ethyl (VASCEPA) 1 g capsule  albuterol (VENTOLIN HFA) 108 (90 Base) MCG/ACT inhaler  Spacer/Aero-Holding Chambers (AEROCHAMBER MV) inhaler    Has the patient contacted their pharmacy? Yes (Agent: If no, request that the patient contact the pharmacy for the refill. If patient does not wish to contact the pharmacy document the reason why and proceed with request.) (Agent: If yes, when and what did the pharmacy advise?) to contact the doctor's office  Is this the correct pharmacy for this prescription? Yes If no, delete pharmacy and type the correct one.  This is the patient's preferred pharmacy:  [facility] [address], [place] - [address] [place] [address] Phone: [phone] Fax: [phone]   Has the prescription been filled recently? Yes  Is the patient out of the medication? No  Has the patient been seen for an appointment in the last year OR does the patient have an upcoming appointment? Yes  Can we respond through MyChart? No  Agent: Please be advised that Rx refills may take up to 3 business days. We ask that you follow-up with your pharmacy. >> Feb 17, 2024 11:06 AM Lizabeth Riggs wrote: Pharmacy is telling Brenlee that Dr. Ziglar needs to send in an order for all above medications to be refilled. Please send orders to [facility] on [address].

## 2024-02-17 NOTE — Telephone Encounter (Signed)
(  Key: [id number])  PA Case ID #: [id number]  Rx #: [id number]  Status sent iconSent to Plan today  Drug Vascepa 1GM capsules  Form Perform[employer] Commercial / Chief of Staff Prior

## 2024-02-17 NOTE — Telephone Encounter (Signed)
 Copied from CRM [id number]. Topic: Clinical - Medication Refill >> Feb 05, 2024  9:18 AM Zipporah Him wrote: Most Recent Primary Care Visit:  Provider: ZIGLAR, SUSAN K  Department: [facility]  Visit Type: NEW PT - OFFICE VISIT  Date: 01/30/2024  Medication: acetaminophen-ibuprofen 250 MG, Spacer/Aero-Holding Chambers (AEROCHAMBER MV) inhaler, albuterol (VENTOLIN HFA) 108 (90 Base) MCG/ACT inhaler, omeprazole (PRILOSEC) 20 MG capsule, atorvastatin (LIPITOR) 80 MG table, furosemide (LASIX) 20 MG table, ibuprofen (ADVIL,MOTRIN) 800 MG tablet, icosapent Ethyl (VASCEPA) 1 g capsule, methocarbamol (ROBAXIN) 750 MG tablet, montelukast (SINGULAIR) 10 MG tablet, naloxone (NARCAN) nasal spray 4 mg/0.1 mL, nortriptyline (PAMELOR) 50 MG capsule, omeprazole (PRILOSEC) 20 MG capsule, promethazine (PHENERGAN) 25 MG table, simvastatin (ZOCOR) 20 MG tablet, budesonide-formoterol (SYMBICORT) 160-4.5 MCG/ACT inhaler, tiotropium (SPIRIVA) 18 MCG inhalation capsule, Fluticasone-Umeclidin-Vilant (TRELEGY ELLIPTA) 100-62.5-25 MCG/ACT AEPB.  Has the patient contacted their pharmacy? Yes   Is this the correct pharmacy for this prescription? Yes If no, delete pharmacy and type the correct one.  This is the patient's preferred pharmacy:   [facility] [address] [place] - [address] [place] [address] Phone: [phone] Fax: [phone]   Has the prescription been filled recently? Yes  Is the patient out of the medication? Yes  Has the patient been seen for an appointment in the last year OR does the patient have an upcoming appointment? Yes  Can we respond through MyChart? No  Agent: Please be advised that Rx refills may take up to 3 business days. We ask that you follow-up with your pharmacy. >> Feb 17, 2024 11:09 AM Lizabeth Riggs wrote: [facility] on [address] is telling Astrid that they need an order for all of her medications before they can refill. [facility] is out of some of the  medications. Please send orders for refills.

## 2024-02-17 NOTE — Telephone Encounter (Signed)
 Ziglar, Michelle K, MD  You1 hour ago (1:23 PM)    Please advise that I have filled hr promethazine and IBU.  Waiting on getting her a DEXA scan to check her bone strength.  Thanks    Called and spoke with pt letting her know the info from Dr. Ziglar and she verbalized understanding. Provided pt the information for [facility] imaging so she could call to get the bone density scan and mammogram scheduled.

## 2024-02-17 NOTE — Telephone Encounter (Signed)
 Copied from CRM [id number]. Topic: Clinical - Prescription Issue >> Feb 17, 2024 11:31 AM Zina Hilts wrote: Reason for CRM: Patient calling back to, stated contacted pharmacy but they don't have her medication ready and she's out of her medication. Asking the PCP to send the order for ALL of her meds on the list.

## 2024-02-17 NOTE — Telephone Encounter (Signed)
 Called pt's pharmacy as pt's methocarbamol, atorvastatin, and vascepa had been refilled day of pt's OV 3/27. Spoke with pharmacy technician who stated that pt's atorvastatin and methocarbamol were ready for pick up. The vascepa was there but is needing a prior auth to be completed which it does look like we are waiting on the result of the prior auth still.  Per pt's last OV, pt is on 80mg  atorvastatin, not 20mg  simvastatin.  Pt is also needing to have her 800mg  ibuprofen and promethazine 25mg  refilled.   Dr. Ziglar, please advise if you are fine with us  refilling these.

## 2024-02-19 ENCOUNTER — Telehealth: Payer: Self-pay | Admitting: Family Medicine

## 2024-02-19 NOTE — Telephone Encounter (Signed)
 Orders for Bone density and mammogram have been faxed to [facility].

## 2024-02-19 NOTE — Telephone Encounter (Signed)
 Copied from CRM [id number]. Topic: Clinical - Request for Lab/Test Order >> Feb 19, 2024 10:00 AM Jethro Morrison wrote: Reason for CRM: THE PATIENT CALLED STATED THE IMAGING ORDERS FOR HER BONE DENSITY TEST AND MAMMOGRAM ARE NOT AT THE LOCATION WHERE SHE IS SUPPOSE TO HAVE IT DONE. STATED THEY ADVISED HER THE ORDERS CAN BE FAXED TO [phone]   Please fax signed orders under imaging tab to [phone]

## 2024-02-26 ENCOUNTER — Telehealth: Payer: Self-pay

## 2024-02-26 ENCOUNTER — Other Ambulatory Visit: Payer: Self-pay | Admitting: Family Medicine

## 2024-02-26 DIAGNOSIS — M810 Age-related osteoporosis without current pathological fracture: Secondary | ICD-10-CM

## 2024-02-26 MED ORDER — VITAMIN D (ERGOCALCIFEROL) 1.25 MG (50000 UNIT) PO CAPS: 50000.0000 [IU] | ORAL_CAPSULE | ORAL | 1 refills | Status: DC

## 2024-02-26 NOTE — Telephone Encounter (Signed)
 Advised that her lumbar spine T score is -2.8.  Hip T score is -1.8.  Calculated her FRAX  scores.  Her ten year risk of any osteoporotic fracture is 4.2% and her ten year risk of a hip fracture from osteoporosis is 1.6%.  So she does not need to take a bisphosphonate now.  Just calcium  supplement and vitamin D  should be enough.  She asks me to write a letter to [facility] explaining that she has osteoporosis.  She tells me that calcium  supplements make her have bone pain. She asks if she can get the calcium  as a prescription.  She asks if she can have pain medication for osteoporosis.  Advised that she does not have any fractures and generally osteoporosis Is painless unless you get a fracture.

## 2024-02-26 NOTE — Telephone Encounter (Signed)
 Copied from CRM [id number]. Topic: General - Other >> Feb 26, 2024 11:20 AM Lorrane Rosette wrote: Reason for CRM: Patient requests call back to go over imaging results. Call back# [phone]

## 2024-03-13 ENCOUNTER — Other Ambulatory Visit: Payer: Self-pay | Admitting: Family Medicine

## 2024-03-13 DIAGNOSIS — M810 Age-related osteoporosis without current pathological fracture: Secondary | ICD-10-CM

## 2024-03-13 DIAGNOSIS — E782 Mixed hyperlipidemia: Secondary | ICD-10-CM

## 2024-03-13 DIAGNOSIS — M546 Pain in thoracic spine: Secondary | ICD-10-CM

## 2024-03-13 DIAGNOSIS — E039 Hypothyroidism, unspecified: Secondary | ICD-10-CM

## 2024-03-13 DIAGNOSIS — K219 Gastro-esophageal reflux disease without esophagitis: Secondary | ICD-10-CM

## 2024-03-13 DIAGNOSIS — J418 Mixed simple and mucopurulent chronic bronchitis: Secondary | ICD-10-CM

## 2024-03-13 MED ORDER — OMEPRAZOLE 20 MG PO CPDR: 20.0000 mg | DELAYED_RELEASE_CAPSULE | Freq: Every day | ORAL | 0 refills | Status: DC

## 2024-03-13 MED ORDER — ATORVASTATIN CALCIUM 80 MG PO TABS: 80.0000 mg | ORAL_TABLET | Freq: Every day | ORAL | 3 refills | Status: DC

## 2024-03-13 MED ORDER — LEVOTHYROXINE SODIUM 150 MCG PO TABS: 150.0000 ug | ORAL_TABLET | Freq: Every day | ORAL | 3 refills | Status: DC

## 2024-03-13 MED ORDER — NORTRIPTYLINE HCL 50 MG PO CAPS: 50.0000 mg | ORAL_CAPSULE | Freq: Every day | ORAL | 0 refills | Status: DC

## 2024-03-13 MED ORDER — MONTELUKAST SODIUM 10 MG PO TABS: 10.0000 mg | ORAL_TABLET | Freq: Every day | ORAL | 0 refills | Status: DC

## 2024-03-13 MED ORDER — ALBUTEROL SULFATE HFA 108 (90 BASE) MCG/ACT IN AERS: 2.0000 | INHALATION_SPRAY | Freq: Four times a day (QID) | RESPIRATORY_TRACT | 0 refills | Status: DC | PRN

## 2024-03-13 MED ORDER — TRELEGY ELLIPTA 100-62.5-25 MCG/ACT IN AEPB: 1.0000 | INHALATION_SPRAY | Freq: Every day | RESPIRATORY_TRACT | 11 refills | Status: DC

## 2024-03-13 MED ORDER — BUDESONIDE-FORMOTEROL FUMARATE 160-4.5 MCG/ACT IN AERO: 2.0000 | INHALATION_SPRAY | Freq: Two times a day (BID) | RESPIRATORY_TRACT | 3 refills | Status: DC

## 2024-03-13 MED ORDER — IBUPROFEN 800 MG PO TABS: 800.0000 mg | ORAL_TABLET | Freq: Three times a day (TID) | ORAL | 1 refills | Status: DC | PRN

## 2024-03-13 MED ORDER — VITAMIN D (ERGOCALCIFEROL) 1.25 MG (50000 UNIT) PO CAPS: 50000.0000 [IU] | ORAL_CAPSULE | ORAL | 1 refills | Status: DC

## 2024-03-13 NOTE — Telephone Encounter (Signed)
 Copied from CRM [id number]. Topic: Clinical - Medication Question >> Mar 13, 2024 12:26 PM Elle L wrote: Reason for CRM: The patient is requesting her promethazine  (PHENERGAN ) 25 MG tablet be changed from 90 pills to 120 pills as when she is nauseas it is not enough. I confirmed that she is not currently having symptoms.   The patient is also requesting to what ARIPiprazole (ABILIFY) 5 MG tablet is prescribed for. The patient's call number is [phone].

## 2024-03-13 NOTE — Telephone Encounter (Signed)
 Copied from CRM [id number]. Topic: Clinical - Medication Refill >> Mar 13, 2024 12:15 PM Elle L wrote: Medication: Vitamin D , Ergocalciferol , (DRISDOL ) 1.25 MG (50000 UNIT) CAPS capsule AND albuterol  (VENTOLIN  HFA) 108 (90 Base) MCG/ACT inhaler AND atorvastatin  (LIPITOR) 80 MG tablet AND budesonide -formoterol  (SYMBICORT ) 160-4.5 MCG/ACT inhaler AND Fluticasone-Umeclidin-Vilant (TRELEGY ELLIPTA ) 100-62.5-25 MCG/ACT AEPB AND furosemide  (LASIX ) 20 MG tablet AND ibuprofen  (ADVIL ) 800 MG tablet ANDmethocarbamol (ROBAXIN ) 750 MG tablet AND  montelukast  (SINGULAIR ) 10 MG tablet AND nortriptyline  (PAMELOR ) 50 MG capsule  AND omeprazole  (PRILOSEC) 20 MG capsule AND simvastatin  (ZOCOR ) 20 MG tablet AND levothyroxine  (SYNTHROID ) 150 MCG tablet  Has the patient contacted their pharmacy? Yes  This is the patient's preferred pharmacy:  [facility] [address] [place] - [address] [place] [address] Phone: [phone] Fax: [phone]  Is this the correct pharmacy for this prescription? Yes If no, delete pharmacy and type the correct one.   Has the prescription been filled recently? Yes  Is the patient out of the medication? No  Has the patient been seen for an appointment in the last year OR does the patient have an upcoming appointment? Yes  Can we respond through MyChart? No  Agent: Please be advised that Rx refills may take up to 3 business days. We ask that you follow-up with your pharmacy.

## 2024-03-18 ENCOUNTER — Telehealth: Payer: Self-pay | Admitting: Family Medicine

## 2024-03-18 NOTE — Telephone Encounter (Signed)
 Copied from CRM [id number]. Topic: Referral - Request for Referral >> Mar 18, 2024 11:09 AM Star East wrote: Did the patient discuss referral with their provider in the last year? Yes (If No - schedule appointment) (If Yes - send message)  Appointment offered? Yes  Type of order/referral and detailed reason for visit: MRI for migraines  Preference of office, provider, location: no preference  If referral order, have you been seen by this specialty before? No (If Yes, this issue or another issue? When? Where?  Can we respond through MyChart? No     Patient wants a referral for mri for migraines

## 2024-03-19 NOTE — Telephone Encounter (Signed)
 LVM requesting a return call.

## 2024-03-20 NOTE — Telephone Encounter (Signed)
 There are no active referrals for MRI. Please advise.

## 2024-03-20 NOTE — Telephone Encounter (Signed)
 Copied from CRM [id number]. Topic: Referral - Status >> Mar 20, 2024  8:30 AM Bambi Bonine D wrote: Reason for CRM: Pt stated that she requested a referral to have a MRI done for her eye pain and migraines. Pt is calling to check the status of the referral to see if it has been processed. Pt stated that Dr.Steven Dingeldein at [facility] will not move forward without the referral. Pt stated that she also needs a referral for a dermatologist.Pt would like for someone to give her a call back today if possible regarding this concern.

## 2024-03-23 ENCOUNTER — Telehealth: Payer: Self-pay

## 2024-03-23 NOTE — Addendum Note (Signed)
 Addended by: Kaley Jutras K on: 03/23/2024 03:09 PM   Modules accepted: Orders

## 2024-03-23 NOTE — Telephone Encounter (Signed)
 Peterson Brandt,  I have called [facility] twice and left a message for them to call back.  I have to have an ordering diagnosis to get the MRI.  Do you go to the [facility] office or the [place] office?  Best regards, Dr. Babbie Dondlinger

## 2024-03-23 NOTE — Telephone Encounter (Signed)
(  Key: [id number])  PA Case ID #: [id number]  Rx #: [id number]  Status Sent to Plan today  Drug Icosapent  Ethyl 1GM capsules  Form [employer] Commercial / Chief of Staff Prior Authorization Form Original Claim Info 75

## 2024-03-24 ENCOUNTER — Other Ambulatory Visit: Payer: Self-pay | Admitting: Family Medicine

## 2024-03-24 ENCOUNTER — Telehealth: Payer: Self-pay | Admitting: Family Medicine

## 2024-03-24 DIAGNOSIS — I639 Cerebral infarction, unspecified: Secondary | ICD-10-CM

## 2024-03-24 NOTE — Telephone Encounter (Signed)
 Incoming call from E2C2 transferred call from [employer] [facility]) reported the doctor's diagnosis code is Headaches and Cerebral Infarction

## 2024-03-24 NOTE — Telephone Encounter (Signed)
 Dr. Ziglar requested to know if patient has had a MRI before. Requested a return call.

## 2024-03-25 ENCOUNTER — Telehealth: Payer: Self-pay

## 2024-03-25 NOTE — Telephone Encounter (Signed)
 Copied from CRM [id number]. Topic: Clinical - Medication Prior Auth >> Mar 25, 2024  9:34 AM Stanly Early wrote: Reason for CRM: DUPIXENT 300 MG/2ML SOAJ so patient can have her medication mailed to her.

## 2024-03-25 NOTE — Telephone Encounter (Signed)
 Patient states her eye appointment is scheduled for 04/07/24 and needs to have MRI done before then. Patient was made aware that MRI has been ordered and has to be approved by insurance.

## 2024-03-25 NOTE — Telephone Encounter (Signed)
 Is Dupixent prescribed by PCP or specialist? Please advise.

## 2024-03-25 NOTE — Telephone Encounter (Signed)
 Copied from CRM [id number]. Topic: Clinical - Request for Lab/Test Order >> Mar 25, 2024  9:38 AM Stanly Early wrote: Patient went to the eye doctor and she stated they can't do anything unless she gets an MRI

## 2024-03-25 NOTE — Telephone Encounter (Signed)
 Patient was informed that she would have to reach [facility] for refills on Dupixent. Patient verbalized understanding. All questions and concerns have been addressed.

## 2024-04-01 ENCOUNTER — Other Ambulatory Visit: Payer: Self-pay | Admitting: Family Medicine

## 2024-04-01 DIAGNOSIS — R110 Nausea: Secondary | ICD-10-CM

## 2024-04-01 MED ORDER — PROMETHAZINE HCL 25 MG PO TABS
25.0000 mg | ORAL_TABLET | Freq: Four times a day (QID) | ORAL | 0 refills | Status: DC | PRN
Start: 2024-04-01 — End: 2024-05-18

## 2024-04-01 NOTE — Telephone Encounter (Signed)
 In a separate message, patient is requesting 90 day supply of promethazine . Please advise.

## 2024-04-01 NOTE — Telephone Encounter (Signed)
 Copied from CRM [id number]. Topic: Clinical - Medication Refill >> Apr 01, 2024  9:35 AM Georgeann Kindred wrote: Medication: lprazolam (XANAX) 2 MG tablet   Has the patient contacted their pharmacy? No (Agent: If no, request that the patient contact the pharmacy for the refill. If patient does not wish to contact the pharmacy document the reason why and proceed with request.) (Agent: If yes, when and what did the pharmacy advise?)  This is the patient's preferred pharmacy:  [facility] [address] [place] - [address] [place] [address] Phone: [phone] Fax: [phone]  Is this the correct pharmacy for this prescription? Yes If no, delete pharmacy and type the correct one.   Has the prescription been filled recently? No  Is the patient out of the medication? Yes  Has the patient been seen for an appointment in the last year OR does the patient have an upcoming appointment? Yes  Can we respond through MyChart? No  Agent: Please be advised that Rx refills may take up to 3 business days. We ask that you follow-up with your pharmacy.

## 2024-04-01 NOTE — Telephone Encounter (Signed)
 Copied from CRM [id number]. Topic: Referral - Request for Referral >> Apr 01, 2024  9:09 AM Georgeann Kindred wrote: Did the patient discuss referral with their provider in the last year? Yes (If No - schedule appointment) (If Yes - send message)  Appointment offered? No  Type of order/referral and detailed reason for visit: Medication Refill for  DUPIXENT 300 MG/2ML SOAJ  Preference of office, provider, location: [facility], Dr. Tyrone Gallop, & [address] [place], [place] If referral order, have you been seen by this specialty before? Yes (If Yes, this issue or another issue? When? Where?  [facility], Dr. Tyrone Gallop, & [address] [place], [place]   Can we respond through MyChart? No

## 2024-04-02 ENCOUNTER — Other Ambulatory Visit: Payer: Self-pay | Admitting: Neurology

## 2024-04-02 ENCOUNTER — Encounter: Payer: Self-pay | Admitting: Neurology

## 2024-04-02 DIAGNOSIS — H5713 Ocular pain, bilateral: Secondary | ICD-10-CM

## 2024-04-02 DIAGNOSIS — R519 Headache, unspecified: Secondary | ICD-10-CM

## 2024-04-02 DIAGNOSIS — Z8673 Personal history of transient ischemic attack (TIA), and cerebral infarction without residual deficits: Secondary | ICD-10-CM

## 2024-04-06 ENCOUNTER — Ambulatory Visit: Payer: Self-pay

## 2024-04-06 ENCOUNTER — Emergency Department

## 2024-04-06 ENCOUNTER — Emergency Department
Admission: EM | Admit: 2024-04-06 | Discharge: 2024-04-06 | Disposition: A | Discharge status: Home / Self Care | Attending: Emergency Medicine | Admitting: Emergency Medicine

## 2024-04-06 ENCOUNTER — Other Ambulatory Visit: Payer: Self-pay

## 2024-04-06 DIAGNOSIS — R112 Nausea with vomiting, unspecified: Secondary | ICD-10-CM | POA: Insufficient documentation

## 2024-04-06 DIAGNOSIS — J449 Chronic obstructive pulmonary disease, unspecified: Secondary | ICD-10-CM | POA: Insufficient documentation

## 2024-04-06 DIAGNOSIS — J352 Hypertrophy of adenoids: Secondary | ICD-10-CM

## 2024-04-06 DIAGNOSIS — R519 Headache, unspecified: Secondary | ICD-10-CM | POA: Diagnosis present

## 2024-04-06 DIAGNOSIS — H5713 Ocular pain, bilateral: Secondary | ICD-10-CM

## 2024-04-06 DIAGNOSIS — H53149 Visual discomfort, unspecified: Secondary | ICD-10-CM | POA: Insufficient documentation

## 2024-04-06 DIAGNOSIS — Z8673 Personal history of transient ischemic attack (TIA), and cerebral infarction without residual deficits: Secondary | ICD-10-CM

## 2024-04-06 LAB — CBC
HCT: 45.3 % (ref 36.0–46.0)
Hemoglobin: 14.9 g/dL (ref 12.0–15.0)
MCH: 30.6 pg (ref 26.0–34.0)
MCHC: 32.9 g/dL (ref 30.0–36.0)
MCV: 93 fL (ref 80.0–100.0)
Platelets: 254 10*3/uL (ref 150–400)
RBC: 4.87 MIL/uL (ref 3.87–5.11)
RDW: 12.3 % (ref 11.5–15.5)
WBC: 9.4 10*3/uL (ref 4.0–10.5)
nRBC: 0 % (ref 0.0–0.2)

## 2024-04-06 LAB — BASIC METABOLIC PANEL WITH GFR
Anion gap: 6 (ref 5–15)
BUN: 14 mg/dL (ref 6–20)
CO2: 27 mmol/L (ref 22–32)
Calcium: 9.1 mg/dL (ref 8.9–10.3)
Chloride: 104 mmol/L (ref 98–111)
Creatinine, Ser: 0.79 mg/dL (ref 0.44–1.00)
GFR, Estimated: >60 mL/min (ref >60)
Glucose, Bld: 101 mg/dL — ABNORMAL HIGH (ref 70–99)
Potassium: 4.2 mmol/L (ref 3.5–5.1)
Sodium: 137 mmol/L (ref 135–145)

## 2024-04-06 MED ORDER — DROPERIDOL 2.5 MG/ML IJ SOLN
2.5000 mg | Freq: Once | INTRAMUSCULAR | Status: AC
  Administered 2024-04-06: 2.5 mg via INTRAMUSCULAR
  Filled 2024-04-06: qty 2

## 2024-04-06 MED ORDER — PROCHLORPERAZINE EDISYLATE 10 MG/2ML IJ SOLN
10.0000 mg | Freq: Once | INTRAMUSCULAR | Status: AC
  Administered 2024-04-06: 10 mg via INTRAVENOUS
  Filled 2024-04-06: qty 2

## 2024-04-06 MED ORDER — KETOROLAC TROMETHAMINE 30 MG/ML IJ SOLN
30.0000 mg | Freq: Once | INTRAMUSCULAR | Status: DC
  Filled 2024-04-06: qty 1

## 2024-04-06 MED ORDER — DIPHENHYDRAMINE HCL 50 MG/ML IJ SOLN
25.0000 mg | Freq: Once | INTRAMUSCULAR | Status: AC
  Administered 2024-04-06: 25 mg via INTRAVENOUS
  Filled 2024-04-06: qty 1

## 2024-04-06 MED ORDER — DEXAMETHASONE SODIUM PHOSPHATE 10 MG/ML IJ SOLN
10.0000 mg | Freq: Once | INTRAMUSCULAR | Status: AC
  Administered 2024-04-06: 10 mg via INTRAMUSCULAR
  Filled 2024-04-06: qty 1

## 2024-04-06 MED ORDER — SODIUM CHLORIDE 0.9 % IV BOLUS
500.0000 mL | Freq: Once | INTRAVENOUS | Status: AC
  Administered 2024-04-06: 500 mL via INTRAVENOUS

## 2024-04-06 MED ORDER — ACETAMINOPHEN 500 MG PO TABS
1000.0000 mg | ORAL_TABLET | Freq: Once | ORAL | Status: DC
  Filled 2024-04-06: qty 2

## 2024-04-06 NOTE — Telephone Encounter (Signed)
 Chief Complaint: headache Symptoms: headache, pain behind eye, vomiting.  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 States she has seen by PCP, neurology and her eye doctor. States that she is experiencing pain behind her eyes and just vomited for the third time in 10 minutes. States that she has had this problem before in her 75's.  States that she is unable to keep anything down. Instructed ED.  Copied from CRM [id number]. Topic: Clinical - Red Word Triage >> Apr 06, 2024 10:06 AM Ethelle Herb L wrote: Red Word that prompted transfer to Nurse Triage:  Head hurts, puking, head is pounding, pain behind eyes unbearable, requesting referral for an MRI, head and  mainly eyes - pt see's eye doctor tuesday. Pt is unable to keep anything down due to pain. Reason for Disposition . Severe pain in one eye  Protocols used: Headache-A-AH

## 2024-04-06 NOTE — Telephone Encounter (Signed)
 Unable to reach patient nor leave VM.

## 2024-04-06 NOTE — Discharge Instructions (Signed)
 Your testing today fortunately did not show an emergency cause for your headache.  Continue to follow-up with neurology and ophthalmology as directed.  Your MRI did show that your adenoids were slightly larger than we would expect for your age.  Please follow-up with an ENT specialist for further evaluation of this.  I have included their information in your paperwork.  Return to the ER for any new or worsening symptoms.

## 2024-04-06 NOTE — ED Provider Notes (Signed)
 [facility] Provider Note    Event Date/Time   First MD Initiated Contact with Patient 04/06/24 1306     (approximate)   History   Headache   HPI  Michelle Conley is a 53 y.o. female past medical history significant for COPD, aortic valve insufficiency, migraine headaches, who presents to the emergency department with headache.  States that this is similar and she has a history of migraine headaches.  Today it is worse because it is going back and forth between each eye.  Denies any change in vision.  Does endorse photophobia with nausea and vomiting.  Denies any chest pain or shortness of breath.  Denies any falls or head trauma.  Not on anticoagulation.  Denies any extremity numbness or weakness.  Recently evaluated by ophthalmology.  Also followed by Dr. Walden Guise with neurology.     Physical Exam   Triage Vital Signs: ED Triage Vitals  Encounter Vitals Group     BP 04/06/24 1225 (!) 142/77     Systolic BP Percentile --      Diastolic BP Percentile --      Pulse Rate 04/06/24 1225 80     Resp 04/06/24 1225 20     Temp 04/06/24 1225 97.9 F (36.6 C)     Temp Source 04/06/24 1225 Oral     SpO2 04/06/24 1225 98 %     Weight 04/06/24 1256 154 lb 1.6 oz (69.9 kg)     Height 04/06/24 1256 4\' 10"  (1.473 m)     Head Circumference --      Peak Flow --      Pain Score 04/06/24 1226 10     Pain Loc --      Pain Education --      Exclude from Growth Chart --     Most recent vital signs: Vitals:   04/06/24 1225  BP: (!) 142/77  Pulse: 80  Resp: 20  Temp: 97.9 F (36.6 C)  SpO2: 98%    Physical Exam Constitutional:      Appearance: She is well-developed.  HENT:     Head: Atraumatic.  Eyes:     General: Lids are normal.     Extraocular Movements: Extraocular movements intact.     Conjunctiva/sclera: Conjunctivae normal.     Pupils: Pupils are equal, round, and reactive to light. Pupils are equal.     Comments: Photophobia  Cardiovascular:      Rate and Rhythm: Regular rhythm.  Pulmonary:     Effort: No respiratory distress.  Abdominal:     General: There is no distension.  Musculoskeletal:        General: Normal range of motion.     Cervical back: Normal range of motion. No rigidity.  Skin:    General: Skin is warm.  Neurological:     Mental Status: She is alert. Mental status is at baseline.     GCS: GCS eye subscore is 4. GCS verbal subscore is 5. GCS motor subscore is 6.     Cranial Nerves: Cranial nerves 2-12 are intact.     Sensory: Sensation is intact.     Motor: Motor function is intact.     Coordination: Coordination is intact.     Gait: Gait is intact.     IMPRESSION / MDM / ASSESSMENT AND PLAN / ED COURSE  I reviewed the triage vital signs and the nursing notes.  Differential diagnosis including migraine headache, malignancy, cerebral venous thrombosis, electrolyte abnormality,  glaucoma  No thunderclap headache, have low suspicion for subarachnoid hemorrhage  EKG  I, Viviano Ground, the attending physician, personally viewed and interpreted this ECG.   Rate: Normal  Rhythm: Normal sinus  Axis: Normal  Intervals: Normal  ST&T Change: None  No tachycardic or bradycardic dysrhythmias while on cardiac telemetry.  RADIOLOGY   LABS (all labs ordered are listed, but only abnormal results are displayed) Labs interpreted as -    Labs Reviewed  BASIC METABOLIC PANEL WITH GFR - Abnormal; Notable for the following components:      Result Value   Glucose, Bld 101 (*)    All other components within normal limits  CBC     MDM  Plan for EKG.  Will give IM Toradol  and droperidol to treat migraine headache and will reevaluate.  Clinical Course as of 04/06/24 1454  Mon Apr 06, 2024  1452 On reevaluation continues to complain of a severe headache.  On chart review had an MRI and MR brain without order from neurology with Dr. Walden Guise.  Given ongoing symptoms we will add on an MRI and MRV to further  evaluate for cerebral venous thrombosis or other acute etiologies to explain the patient's ongoing headache and vision pain.  Will give IV fluids, Compazine  and Benadryl  for further migraine treatment. [SM]    Clinical Course User Index [SM] Viviano Ground, MD    PROCEDURES:  Critical Care performed: No  Procedures  Patient's presentation is most consistent with acute presentation with potential threat to life or bodily function.   MEDICATIONS ORDERED IN ED: Medications  acetaminophen (TYLENOL) tablet 1,000 mg (1,000 mg Oral Not Given 04/06/24 1427)  sodium chloride  0.9 % bolus 500 mL (has no administration in time range)  prochlorperazine  (COMPAZINE ) injection 10 mg (has no administration in time range)  diphenhydrAMINE  (BENADRYL ) injection 25 mg (has no administration in time range)  droperidol (INAPSINE) 2.5 MG/ML injection 2.5 mg (2.5 mg Intramuscular Given 04/06/24 1410)  dexamethasone  (DECADRON ) injection 10 mg (10 mg Intramuscular Given 04/06/24 1421)    FINAL CLINICAL IMPRESSION(S) / ED DIAGNOSES   Final diagnoses:  Acute nonintractable headache, unspecified headache type     Rx / DC Orders   ED Discharge Orders     None        Note:  This document was prepared using Dragon voice recognition software and may include unintentional dictation errors.   Viviano Ground, MD 04/06/24 1454

## 2024-04-06 NOTE — ED Triage Notes (Addendum)
 Pt to ED via [facility] from home. Pt reports HA and left eye pain x1 month. Pt reports HA 10/10 and photophoobia. Pt recently seen at eye doctor and given eye drops.  Pt with hx of CVA and HTN. Pt reports N/V x3 wks. Pt everyday smoker

## 2024-04-06 NOTE — ED Provider Notes (Signed)
 Care of this patient assumed from prior physician at 1500 pending MRI, MRV, disposition. Please see prior physician note for further details.  Briefly this is a 53 year old female with history of migraine headaches presenting to the emergency department for evaluation of headache.  Seen by neurology and ophthalmology. Head CT without acute bleed.  Given refractory headache, MRI/MRV was ordered.  These were pending at time of signout.  MRI/MRV resulted without acute abnormality.  Radiology did note prominence of adenoids greater than expected for the patient's age.  Will place referral to ENT.  Patient reevaluated.  Reports feeling improved and is comfortable discharge home.  Strict return precautions provided.  Patient discharged in stable condition.   Claria Crofts, MD 04/06/24 [phone]

## 2024-04-06 NOTE — ED Triage Notes (Signed)
 Arrived by [facility] From home with headache X1 month. C/o pressure behind left eyeball that radiates to right eye.  EMS vitals: 182/124 b/p 98% RA 83HR 127 CBG

## 2024-04-07 ENCOUNTER — Encounter: Payer: Self-pay | Admitting: Neurology

## 2024-04-08 ENCOUNTER — Ambulatory Visit: Payer: Self-pay

## 2024-04-08 ENCOUNTER — Ambulatory Visit: Admitting: Family Medicine

## 2024-04-08 NOTE — Telephone Encounter (Signed)
 FYI Only or Action Required?: Action required by provider  Patient was last seen in primary care on 02/20/24. Called Nurse Triage reporting Rash. Symptoms began several years ago. Interventions attempted: OTC medications: Abx cream. Symptoms are: rapidly worsening.  Triage Disposition: See Physician Within 24 Hours  Patient/caregiver understands and will follow disposition?: YesCopied from CRM [id number]. Topic: Clinical - Red Word Triage >> Apr 08, 2024  9:26 AM Elle L wrote: Red Word that prompted transfer to Nurse Triage: The patient was following up on her Dermatology referral request. However, she advised me that she has ezcema and she has open wounds from it on her arms and back and it is bleeding. She has been treating it with ointment and peroxide. Reason for Disposition  SEVERE itching (i.e., interferes with sleep, normal activities or school)  Answer Assessment - Initial Assessment Questions 1. APPEARANCE of RASH: "Describe the rash." (e.g., spots, blisters, raised areas, skin peeling, scaly)     Bleeding, sores 2. SIZE: "How big are the spots?" (e.g., tip of pen, eraser, coin; inches, centimeters)     Dime size  3. LOCATION: "Where is the rash located?"     Legs, back, shoulders, arms  4. COLOR: "What color is the rash?" (Note: It is difficult to assess rash color in people with darker-colored skin. When this situation occurs, simply ask the caller to describe what they see.)     Red, streaked, black in center  5. ONSET: "When did the rash begin?"     years 6. FEVER: "Do you have a fever?" If Yes, ask: "What is your temperature, how was it measured, and when did it start?"     One spot on leg is warm  7. ITCHING: "Does the rash itch?" If Yes, ask: "How bad is the itch?" (Scale 1-10; or mild, moderate, severe)     Severe  8. CAUSE: "What do you think is causing the rash?"     Not sure  9. MEDICINE FACTORS: "Have you started any new medicines within the last 2 weeks?" (e.g.,  antibiotics)      Na  10. OTHER SYMPTOMS: "Do you have any other symptoms?" (e.g., dizziness, headache, sore throat, joint pain)       Headache, nauseous      Pt want on Derm referral and medication. Sores have worsened.  Protocols used: Rash or Redness - [facility]

## 2024-04-10 ENCOUNTER — Other Ambulatory Visit: Payer: Self-pay | Admitting: Family Medicine

## 2024-04-10 DIAGNOSIS — M546 Pain in thoracic spine: Secondary | ICD-10-CM

## 2024-04-10 MED ORDER — IBUPROFEN 800 MG PO TABS: 800.0000 mg | ORAL_TABLET | Freq: Three times a day (TID) | ORAL | 1 refills | Status: DC | PRN

## 2024-04-10 NOTE — Telephone Encounter (Signed)
 Copied from CRM [id number]. Topic: Clinical - Medication Refill >> Apr 10, 2024  9:55 AM Felizardo Hotter wrote: Medication: ibuprofen  (ADVIL ) 800 MG tablet  Has the patient contacted their pharmacy? Yes (Agent: If no, request that the patient contact the pharmacy for the refill. If patient does not wish to contact the pharmacy document the reason why and proceed with request.) (Agent: If yes, when and what did the pharmacy advise?)  This is the patient's preferred pharmacy:  [facility] [address], [place] - [address] [place] [address] Phone: [phone] Fax: [phone]  Is this the correct pharmacy for this prescription? Yes If no, delete pharmacy and type the correct one.   Has the prescription been filled recently? Yes  Is the patient out of the medication? Yes  Has the patient been seen for an appointment in the last year OR does the patient have an upcoming appointment? Yes  Can we respond through MyChart? Yes  Agent: Please be advised that Rx refills may take up to 3 business days. We ask that you follow-up with your pharmacy.

## 2024-04-11 ENCOUNTER — Other Ambulatory Visit: Payer: Self-pay | Admitting: Family Medicine

## 2024-04-13 ENCOUNTER — Ambulatory Visit: Admitting: Family Medicine

## 2024-04-13 ENCOUNTER — Ambulatory Visit: Payer: Self-pay

## 2024-04-13 NOTE — Telephone Encounter (Signed)
 FYI Only or Action Required?: FYI only for provider  Patient was last seen in primary care on 01/30/2024 by Ziglar, Lacinda Pica, MD. Called Nurse Triage reporting No chief complaint on file.. Symptoms began ongoing. Interventions attempted: Other: ER & urgent care for evaluation. Symptoms are: gradually worsening.  Triage Disposition: Go to ED Now (Notify PCP): patient refused ED stated she has already been without resolution.  Patient/caregiver understands and will follow disposition?: No, refuses disposition  Copied from CRM [id number]. Topic: Clinical - Red Word Triage >> Apr 13, 2024 11:45 AM Juluis Ok wrote: [facility] that prompted transfer to Nurse Triage: LT eye pain, Headache Reason for Disposition  SEVERE eye pain  Answer Assessment - Initial Assessment Questions 1. ONSET: "When did the pain start?" (e.g., minutes, hours, days)     X 1 month 2. TIMING: "Does the pain come and go, or has it been constant since it started?" (e.g., constant, intermittent, fleeting)     constant 3. SEVERITY: "How bad is the pain?"   (Scale 1-10; mild, moderate or severe)   - MILD (1-3): doesn't interfere with normal activities    - MODERATE (4-7): interferes with normal activities or awakens from sleep    - SEVERE (8-10): excruciating pain and patient unable to do normal activities     severe 4. LOCATION: "Where does it hurt?"  (e.g., eyelid, eye, cheekbone)     Left eye pain - feels like eye is  5. CAUSE: "What do you think is causing the pain?"     unknown 6. VISION: "Do you have blurred vision or changes in your vision?"      no 7. EYE DISCHARGE: "Is there any discharge (pus) from the eye(s)?"  If Yes, ask: "What color is it?"      no 8. FEVER: "Do you have a fever?" If Yes, ask: "What is it, how was it measured, and when did it start?"      no 9. OTHER SYMPTOMS: "Do you have any other symptoms?" (e.g., headache, nasal discharge, facial rash)     headache 10. PREGNANCY: "Is there any chance you are  pregnant?" "When was your last menstrual period?"       N/a  Pt states feels like something is pushing her eye out from behind the eye.  Pt states she has been seen by eye doctor that could not find anything physically wrong and/or could be causing pain.  Pt seen in ER r/t without any findings.  Pt stated she only called to cancel her appt with PCP due to just leaving eye doctor and pt states she is tired, nausea, headache along with eye pain - pt stated she can not physically sit & wait for this appt.  Protocols used: Eye Pain and Other Symptoms-A-AH

## 2024-04-13 NOTE — Telephone Encounter (Signed)
 Copied from CRM [id number]. Topic: Clinical - Red Word Triage >> Apr 13, 2024  9:35 AM Adonis Hoot wrote: Red Word that prompted transfer to Nurse Triage:  severe pain behind eyes,vomiting Reason for Disposition  [1] Vomiting AND [2] 2 or more times  (Exception: Similar to previous migraines.)  Answer Assessment - Initial Assessment Questions 1. LOCATION: "Where does it hurt?"      Behind eyes.   2. ONSET: "When did the headache start?" (Minutes, hours or days)      2 weeks ago, but has been trying to manage at home with prescription medications. Went to ED on 6/2  3. PATTERN: "Does the pain come and go, or has it been constant since it started?"     Constant  4. SEVERITY: "How bad is the pain?" and "What does it keep you from doing?"  (e.g., Scale 1-10; mild, moderate, or severe)   - MILD (1-3): doesn't interfere with normal activities    - MODERATE (4-7): interferes with normal activities or awakens from sleep    - SEVERE (8-10): excruciating pain, unable to do any normal activities        Severe  5. RECURRENT SYMPTOM: "Have you ever had headaches before?" If Yes, ask: "When was the last time?" and "What happened that time?"      Yes, the last time headache was this bad it was viral encephalitis.   6. CAUSE: "What do you think is causing the headache?"     Cluster migraine  7. MIGRAINE: "Have you been diagnosed with migraine headaches?" If Yes, ask: "Is this headache similar?"      Yes  8. HEAD INJURY: "Has there been any recent injury to the head?"      No  9. OTHER SYMPTOMS: "Do you have any other symptoms?" (fever, stiff neck, eye pain, sore throat, cold symptoms)     Vomiting, eye pain  10. PREGNANCY: "Is there any chance you are pregnant?" "When was your last menstrual period?"       no   Seen in Ed on 6/2. Has been taking ibuprofen , anxiety medication and a nausea medication but still vomiting. Pt followed by neurologist, Dr. Walden Guise. MRI ordered, results pending. Pt endorsing  worsening pain behind eyes, seeing eye doctor this morning. RN advising ED, pt declines at this time saying that "they wont do anything". Pt just wanted to let PCP know that her pain is worsening likely because she can't keep anything down and she would like to explore other nausea medications and if possible new pain medications as well. Pt also asking if PCP can reach radiologist and have MRI results expedited.   Will send message to office with this request. Appt notes updated. Pt says if anyone needs to call her, please conact her sister's phone at [phone]  Protocols used: [facility]

## 2024-04-14 NOTE — Telephone Encounter (Signed)
 Patient states that she will be following up with Dr. Walden Guise towards the end of the month. Also, she will be scheduling an appointment to ENT due to imaging findings. Also, patient has declined suppositories.

## 2024-04-17 ENCOUNTER — Telehealth: Payer: Self-pay

## 2024-04-17 ENCOUNTER — Telehealth: Payer: Self-pay | Admitting: Family Medicine

## 2024-04-17 NOTE — Telephone Encounter (Signed)
 Returned her call.  Called her cell number and left message that I was returning her call.

## 2024-04-17 NOTE — Telephone Encounter (Signed)
 Copied from CRM [id number]. Topic: Clinical - Medical Advice >> Apr 17, 2024 10:08 AM Adonis Hoot wrote: Reason for CRM: Patient is requesting a phone call from Dr Ziglar or her medical assistant to see if she could get in to do a virtual appointment with her.Patient has called in several times this week and was triaged. She was told to go to ER she said that she went and they did nothing to really help her. She is wanting some medication from Dr Ziglar

## 2024-04-20 ENCOUNTER — Telehealth: Payer: Self-pay | Admitting: Family Medicine

## 2024-04-20 ENCOUNTER — Other Ambulatory Visit: Payer: Self-pay | Admitting: Family Medicine

## 2024-04-20 NOTE — Telephone Encounter (Signed)
 Pt returning call to Dr. Ziglar, pt requesting call back. Pt requesting a call as soon as possible.

## 2024-04-20 NOTE — Telephone Encounter (Signed)
 Returned patient's call.  She is miserable with her migraines and has a Blinding headache.  She cannot open her right eye.  She has had a CT scan and MRI and MRA of her head and the only finding was enlarged adenoids.  She has an appointment with ENT but not for a month.  She cannot stand the pain any longer.  She has had a terrible HA behind her eyes for 2 weeks now.  She has seen Dr. Walden Guise and been to the ER,  She got a shot of Emgality at Dr. Imelda Man office.  She has had a steroid taper and an increase in her Abilify from 2mg  to 5mg  at bedtime.  She is also taking Nortriptyline  50mg  BID.  She is asking hydrocodone.  Something to stop the pain.  She doesn't want a big narcotic, just hydrocodone will do.  Advise d that she should speak to Dr. Walden Guise since he is her Neurologist and see what he recommends.

## 2024-04-20 NOTE — Telephone Encounter (Signed)
 Discussed with patient that she is having a blinding headache.  It has been going on for 2 weeks now.  She has been to the ER ad got one shot which only helped for a few minutes.  She has had a head CT scan, MRI and MRV and all that was found was enlargement of her adenoids.  She has an appointment with ENT in 4 weeks which is the first available appointment.  She has seen the ophthalmologist as well.  She is taking IBU 800mg  and it is not helping her pain.  She asks for hydrocodone.  Advised that she will need to ask her Neurologist, Dr. Walden Guise, if she can have hydrocodone.

## 2024-04-29 ENCOUNTER — Ambulatory Visit: Admitting: Family Medicine

## 2024-04-30 ENCOUNTER — Ambulatory Visit (INDEPENDENT_AMBULATORY_CARE_PROVIDER_SITE_OTHER): Admitting: Family Medicine

## 2024-04-30 ENCOUNTER — Encounter: Payer: Self-pay | Admitting: Family Medicine

## 2024-04-30 VITALS — BP 154/87 | HR 76 | Temp 97.8°F | Resp 18 | Ht <= 58 in | Wt 156.0 lb

## 2024-04-30 DIAGNOSIS — E559 Vitamin D deficiency, unspecified: Secondary | ICD-10-CM

## 2024-04-30 DIAGNOSIS — G43E09 Chronic migraine with aura, not intractable, without status migrainosus: Secondary | ICD-10-CM | POA: Diagnosis not present

## 2024-04-30 DIAGNOSIS — G43711 Chronic migraine without aura, intractable, with status migrainosus: Secondary | ICD-10-CM | POA: Diagnosis not present

## 2024-04-30 MED ORDER — HYDROCODONE-ACETAMINOPHEN 5-325 MG PO TABS: 1.0000 | ORAL_TABLET | Freq: Three times a day (TID) | ORAL | 0 refills | Status: DC | PRN

## 2024-04-30 NOTE — Progress Notes (Signed)
 Established Patient Office Visit  Subjective   Patient ID: Michelle Conley, female    DOB: 03/25/1971  Age: 53 y.o. MRN: [id number]  Chief Complaint  Patient presents with   Medical Management of Chronic Issues    HPI [name] presents to establish care.  Delightful 53 year old with HTN, hypothyroidism, aortic insufficiency (Dr. Lucyann), COPD (Dr. Parris), chronic headaches (Dr. Fayette), anxiety, adenomatous colon polyps (07/2023, repeat in 5 years), nicotine addiction, anxiety and depression (Dr. Beverley), CVA, s/p hysterectomy and s/p choleycystectomy.  She reports she has had almost a continuous headache since May 5th.  Some component of this migraine is a rebound HA because she has been taking IBU 800mg  TID.  Dr. Lane, her Neurologist, asked her to stop the ibuprofen  and take nortriptyline  50mg  BID, promethazine  as needed, omeprazole  and her Xanax.  She reports that her HA is causing blurred vision and double vision.  She has seen an ophthalmologist.  She got a brain MRI that was reported normal.   Dr. Lane advised that she can have a pain medication, one to take at bedtime, if her PCP would write this.   She reports that she got her prescription for vitamin D2 50,000 IU and took one every day for 12 days instead of taking them once a week for 12 weeks.  She denies urinary frequency, increased thirst. Her PHQ-9 score is 16 and her GAD-7 score is 21.  Her psychiatrist Dr. Michiel has retired.  She has been assigned to a different psychiatrist named Lamarr Matsu..  She is concerned that Dr. Matsu is going to stop her Xanax    ROS    Objective:     BP (!) 154/87 (BP Location: Right Arm, Patient Position: Sitting, Cuff Size: Normal)   Pulse 76   Temp 97.8 F (36.6 C) (Oral)   Resp 18   Ht 4' 10 (1.473 m)   Wt 156 lb (70.8 kg)   LMP 06/02/2015   SpO2 96%   BMI 32.60 kg/m    Physical Exam Vitals reviewed.  Constitutional:      Appearance: Normal appearance.  HENT:      Head: Normocephalic.   Eyes:     General:        Right eye: No discharge.        Left eye: No discharge.    Cardiovascular:     Rate and Rhythm: Normal rate.  Pulmonary:     Effort: Pulmonary effort is normal.   Neurological:     Mental Status: She is alert and oriented to person, place, and time.   Psychiatric:        Mood and Affect: Mood normal.        Behavior: Behavior normal.        Thought Content: Thought content normal.        Judgment: Judgment normal.          No results found for any visits on 04/30/24.    The ASCVD Risk score (Arnett DK, et al., 2019) failed to calculate for the following reasons:   Risk score cannot be calculated because patient has a medical history suggesting prior/existing ASCVD    Assessment & Plan:  Vitamin D  deficiency Assessment & Plan: She reports that she took her vitamin D2 50,000 daily for 12 days.  Obvious concerned about her calcium  level.  Checking her vitamin D  level and CMP.  Orders: -     CMP14+EGFR -     VITAMIN D   25 Hydroxy (Vit-D Deficiency, Fractures)  Intractable chronic migraine without aura and with status migrainosus -     HYDROcodone-Acetaminophen ; Take 1 tablet by mouth every 8 (eight) hours as needed for moderate pain (pain score 4-6).  Dispense: 15 tablet; Refill: 0  Chronic migraine with aura without status migrainosus, not intractable Assessment & Plan: Will give her hydrocodone 5 mg 1 p.o. nightly #15.  Please follow-up with Dr. Lane.      Return in about 4 weeks (around 05/28/2024).    Wendel Homeyer K Haytham Maher, MD

## 2024-04-30 NOTE — Assessment & Plan Note (Signed)
 Will give her hydrocodone 5 mg 1 p.o. nightly #15.  Please follow-up with Dr. Lane.

## 2024-04-30 NOTE — Assessment & Plan Note (Signed)
 She reports that she took her vitamin D2 50,000 daily for 12 days.  Obvious concerned about her calcium  level.  Checking her vitamin D  level and CMP.

## 2024-05-01 ENCOUNTER — Ambulatory Visit: Payer: Self-pay | Admitting: Family Medicine

## 2024-05-01 ENCOUNTER — Ambulatory Visit: Payer: Self-pay

## 2024-05-01 LAB — CMP14+EGFR
ALT: 24 IU/L (ref 0–32)
AST: 19 IU/L (ref 0–40)
Albumin: 4.8 g/dL (ref 3.8–4.9)
Alkaline Phosphatase: 136 IU/L — ABNORMAL HIGH (ref 44–121)
BUN/Creatinine Ratio: 8 — ABNORMAL LOW (ref 9–23)
BUN: 9 mg/dL (ref 6–24)
Bilirubin Total: <0.2 mg/dL (ref 0.0–1.2)
CO2: 21 mmol/L (ref 20–29)
Calcium: 10 mg/dL (ref 8.7–10.2)
Chloride: 99 mmol/L (ref 96–106)
Creatinine, Ser: 1.13 mg/dL — ABNORMAL HIGH (ref 0.57–1.00)
Globulin, Total: 2.7 g/dL (ref 1.5–4.5)
Glucose: 84 mg/dL (ref 70–99)
Potassium: 4 mmol/L (ref 3.5–5.2)
Sodium: 137 mmol/L (ref 134–144)
Total Protein: 7.5 g/dL (ref 6.0–8.5)
eGFR: 59 mL/min/{1.73_m2} — ABNORMAL LOW (ref >59)

## 2024-05-01 LAB — VITAMIN D 25 HYDROXY (VIT D DEFICIENCY, FRACTURES): Vit D, 25-Hydroxy: 22.7 ng/mL — ABNORMAL LOW (ref 30.0–100.0)

## 2024-05-01 NOTE — Telephone Encounter (Signed)
    FYI Only or Action Required?: Action required by provider: lab or test result follow-up needed.  Patient was last seen in primary care on 04/30/2024 by Ziglar, Susan K, MD. Called Nurse Triage reporting lab results. Symptoms began several days ago. Interventions attempted: Rest, hydration, or home remedies. Symptoms are: blurry vision, feeling unwell, Vitamin D  overdose (reported per patient) unchanged.  Triage Disposition: Call PCP Within 24 Hours  Patient/caregiver understands and will follow disposition?: Yes                           Copied from CRM [id number]. Topic: Clinical - Red Word Triage >> May 01, 2024 11:05 AM Michelle Conley wrote:  She is still having side affects from taking too many Vitamin D  pills. Dr is aware of the mishap however the pt would love to know what her next steps should be following her lab work. Pt stated dr will review  her labs before prescribing her anything to help Reason for Disposition  Caller requesting lab results  (Exception: Routine or non-urgent lab result.)  Answer Assessment - Initial Assessment Questions Patient seen yesterday with PCP Dr Onita in office. Patient states she made PCP aware of her symptoms (blurry vision, vitamin D  overdose, that she stopped all her medications).   1. REASON FOR CALL or QUESTION: What is your reason for calling today? or How can I best help you? or What question do you have that I can help answer?     She would like to address her Vitamin D  level results with PCP (no notes in chart from PCP). She would also like to know how long to expect for her symptoms to subside. Also she asked how much water she should be taking in to wash this out of her system.  2. CALLER: Document the source of call. (e.g., laboratory, patient).     Patient.  Protocols used: PCP Call - No Triage-A-AH

## 2024-05-11 ENCOUNTER — Ambulatory Visit: Payer: Self-pay

## 2024-05-11 NOTE — Telephone Encounter (Addendum)
 FYI Only or Action Required?: FYI only for provider.  Patient was last seen in primary care on 04/30/2024 by Ziglar, Susan K, MD.  Called Nurse Triage reporting Migraine, Eye Pain, and Eye Problem.  Symptoms began several months ago.  Interventions attempted: Prescription medications: hydrocodone  and Rest, hydration, or home remedies.  Symptoms are: gradually worsening.  Triage Disposition: Go to ED or PCP/Alternative with Approval  Patient/caregiver understands and will follow disposition?: Unsure    Copied from CRM [id number]. Topic: Clinical - Red Word Triage >> May 11, 2024  3:44 PM Ivette P wrote: Red Word that prompted transfer to Nurse Triage: swollen and little bit,call in something for nose spray. for acidd reflux, help the sinuss and swelling?   Head feels like explode - extreme pain. Been to the ER. not being given any controlled substance. unable to see clearly. using both eyes everything is blurry. Reason for Disposition  Loss of vision or double vision  (Exception: Same as prior migraines.)  Additional Information  Commented on: Answer Assessment    Triager advised ED/calling 911. Triager offered to call 911 on pt behalf, but pt declined.  Answer Assessment - Initial Assessment Questions 1. ONSET: When did the pain start? (e.g., minutes, hours, days)     2 months 2. TIMING: Does the pain come and go, or has it been constant since it started? (e.g., constant, intermittent, fleeting)     constant 3. SEVERITY: How bad is the pain?   (Scale 1-10; mild, moderate or severe)   - MILD (1-3): doesn't interfere with normal activities    - MODERATE (4-7): interferes with normal activities or awakens from sleep    - SEVERE (8-10): excruciating pain and patient unable to do normal activities     10/10 Reports taking 15 hydrocodone  pills which I cut in half - only has 1.5 pills left - reports barely providing relief 4. LOCATION: Where does it hurt?  (e.g., eyelid, eye,  cheekbone)     I'm stuck in a rebound cycle of migraine I can barely hold my eyes open 5. CAUSE: What do you think is causing the pain?     migraine 6. VISION: Do you have blurred vision or changes in your vision?      Endorses trouble seeing 7. EYE DISCHARGE: Is there any discharge (pus) from the eye(s)?  If Yes, ask: What color is it?      Yes, clear drainage - reports starting to get eye crust 8. FEVER: Do you have a fever? If Yes, ask: What is it, how was it measured, and when did it start?      denies 9. OTHER SYMPTOMS: Do you have any other symptoms? (e.g., headache, nasal discharge, facial rash)     Endorses migraine Recent visit to specialist - ENT reports minimal swelling in nasal cavity via scope - was supposed to send nasal spray Reports has been to hospital -- had multiple scans (MRI, CT) 10. PREGNANCY: Is there any chance you are pregnant? When was your last menstrual period?       N/a  Protocols used: Eye Pain and Other Symptoms-A-AH, Headache-A-AH

## 2024-05-13 ENCOUNTER — Telehealth: Payer: Self-pay | Admitting: Family Medicine

## 2024-05-13 DIAGNOSIS — G43711 Chronic migraine without aura, intractable, with status migrainosus: Secondary | ICD-10-CM

## 2024-05-13 NOTE — Telephone Encounter (Signed)
 Previous message has been forwarded to Dr. Ziglar for review.

## 2024-05-13 NOTE — Telephone Encounter (Signed)
 Copied from CRM [id number]. Topic: Clinical - Medication Refill >> May 13, 2024 11:19 AM DeAngela L wrote: Medication: ibuprofen  (ADVIL ) 800 MG tablet   Has the patient contacted their pharmacy? no (Agent: If no, request that the patient contact the pharmacy for the refill. If patient does not wish to contact the pharmacy document the reason why and proceed with request.) (Agent: If yes, when and what did the pharmacy advise?)  This is the patient's preferred pharmacy:  [facility] [address], [place] - [address] [place] [address] Phone: [phone] Fax: [phone]  Is this the correct pharmacy for this prescription? yes If no, delete pharmacy and type the correct one.   Has the prescription been filled recently? Yes   Is the patient out of the medication? no  Has the patient been seen for an appointment in the last year OR does the patient have an upcoming appointment? yes  Can we respond through MyChart? no  Agent: Please be advised that Rx refills may take up to 3 business days. We ask that you follow-up with your pharmacy.

## 2024-05-13 NOTE — Telephone Encounter (Signed)
 Copied from CRM [id number]. Topic: Clinical - Medical Advice >> May 13, 2024 11:11 AM Michelle Conley wrote: Reason for CRM: patient states the medication worked a little and she would like to ask if Dr Ziglar could call her to discuss patient going to a pain management clinic or what was discussed at her last appt  Pt num [phone] (M) HYDROcodone -acetaminophen  (NORCO/VICODIN) 5-325 MG tablet Pt cut them in half and it helped, the pt is calling cause she wants to talk about how she can get back to normal

## 2024-05-14 ENCOUNTER — Inpatient Hospital Stay
Admission: EM | Admit: 2024-05-14 | Discharge: 2024-05-18 | DRG: 125 | Disposition: A | Discharge status: Home / Self Care | Source: Ambulatory Visit | Attending: Internal Medicine | Admitting: Internal Medicine

## 2024-05-14 ENCOUNTER — Emergency Department

## 2024-05-14 ENCOUNTER — Observation Stay

## 2024-05-14 ENCOUNTER — Other Ambulatory Visit: Payer: Self-pay

## 2024-05-14 ENCOUNTER — Encounter: Payer: Self-pay | Admitting: Emergency Medicine

## 2024-05-14 DIAGNOSIS — J4520 Mild intermittent asthma, uncomplicated: Secondary | ICD-10-CM | POA: Diagnosis not present

## 2024-05-14 DIAGNOSIS — G43711 Chronic migraine without aura, intractable, with status migrainosus: Secondary | ICD-10-CM

## 2024-05-14 DIAGNOSIS — R2990 Unspecified symptoms and signs involving the nervous system: Principal | ICD-10-CM

## 2024-05-14 DIAGNOSIS — H3410 Central retinal artery occlusion, unspecified eye: Secondary | ICD-10-CM | POA: Diagnosis present

## 2024-05-14 DIAGNOSIS — Z886 Allergy status to analgesic agent status: Secondary | ICD-10-CM

## 2024-05-14 DIAGNOSIS — Z801 Family history of malignant neoplasm of trachea, bronchus and lung: Secondary | ICD-10-CM

## 2024-05-14 DIAGNOSIS — Z800 Family history of malignant neoplasm of digestive organs: Secondary | ICD-10-CM

## 2024-05-14 DIAGNOSIS — Z880 Allergy status to penicillin: Secondary | ICD-10-CM

## 2024-05-14 DIAGNOSIS — Z823 Family history of stroke: Secondary | ICD-10-CM

## 2024-05-14 DIAGNOSIS — F419 Anxiety disorder, unspecified: Secondary | ICD-10-CM | POA: Diagnosis not present

## 2024-05-14 DIAGNOSIS — Z79899 Other long term (current) drug therapy: Secondary | ICD-10-CM

## 2024-05-14 DIAGNOSIS — R29810 Facial weakness: Secondary | ICD-10-CM | POA: Diagnosis present

## 2024-05-14 DIAGNOSIS — E876 Hypokalemia: Secondary | ICD-10-CM | POA: Diagnosis present

## 2024-05-14 DIAGNOSIS — K219 Gastro-esophageal reflux disease without esophagitis: Secondary | ICD-10-CM | POA: Diagnosis present

## 2024-05-14 DIAGNOSIS — E785 Hyperlipidemia, unspecified: Secondary | ICD-10-CM | POA: Diagnosis present

## 2024-05-14 DIAGNOSIS — I639 Cerebral infarction, unspecified: Secondary | ICD-10-CM | POA: Diagnosis not present

## 2024-05-14 DIAGNOSIS — Z885 Allergy status to narcotic agent status: Secondary | ICD-10-CM

## 2024-05-14 DIAGNOSIS — H4922 Sixth [abducent] nerve palsy, left eye: Secondary | ICD-10-CM | POA: Diagnosis present

## 2024-05-14 DIAGNOSIS — E669 Obesity, unspecified: Secondary | ICD-10-CM | POA: Diagnosis present

## 2024-05-14 DIAGNOSIS — H4902 Third [oculomotor] nerve palsy, left eye: Secondary | ICD-10-CM | POA: Diagnosis present

## 2024-05-14 DIAGNOSIS — J4489 Other specified chronic obstructive pulmonary disease: Secondary | ICD-10-CM | POA: Diagnosis present

## 2024-05-14 DIAGNOSIS — H3412 Central retinal artery occlusion, left eye: Principal | ICD-10-CM | POA: Diagnosis present

## 2024-05-14 DIAGNOSIS — Z91030 Bee allergy status: Secondary | ICD-10-CM

## 2024-05-14 DIAGNOSIS — F4310 Post-traumatic stress disorder, unspecified: Secondary | ICD-10-CM | POA: Diagnosis present

## 2024-05-14 DIAGNOSIS — R110 Nausea: Secondary | ICD-10-CM

## 2024-05-14 DIAGNOSIS — F17210 Nicotine dependence, cigarettes, uncomplicated: Secondary | ICD-10-CM | POA: Diagnosis present

## 2024-05-14 DIAGNOSIS — F32A Depression, unspecified: Secondary | ICD-10-CM | POA: Diagnosis present

## 2024-05-14 DIAGNOSIS — I10 Essential (primary) hypertension: Secondary | ICD-10-CM | POA: Diagnosis present

## 2024-05-14 DIAGNOSIS — Z8661 Personal history of infections of the central nervous system: Secondary | ICD-10-CM

## 2024-05-14 DIAGNOSIS — Z6832 Body mass index (BMI) 32.0-32.9, adult: Secondary | ICD-10-CM

## 2024-05-14 DIAGNOSIS — Z79890 Hormone replacement therapy (postmenopausal): Secondary | ICD-10-CM

## 2024-05-14 DIAGNOSIS — Z833 Family history of diabetes mellitus: Secondary | ICD-10-CM

## 2024-05-14 DIAGNOSIS — J45909 Unspecified asthma, uncomplicated: Secondary | ICD-10-CM | POA: Insufficient documentation

## 2024-05-14 DIAGNOSIS — E039 Hypothyroidism, unspecified: Secondary | ICD-10-CM | POA: Diagnosis present

## 2024-05-14 DIAGNOSIS — Z881 Allergy status to other antibiotic agents status: Secondary | ICD-10-CM

## 2024-05-14 DIAGNOSIS — Z888 Allergy status to other drugs, medicaments and biological substances status: Secondary | ICD-10-CM

## 2024-05-14 LAB — CBC
HCT: 42.2 % (ref 36.0–46.0)
Hemoglobin: 14.4 g/dL (ref 12.0–15.0)
MCH: 30.7 pg (ref 26.0–34.0)
MCHC: 34.1 g/dL (ref 30.0–36.0)
MCV: 90 fL (ref 80.0–100.0)
Platelets: 275 K/uL (ref 150–400)
RBC: 4.69 MIL/uL (ref 3.87–5.11)
RDW: 13.1 % (ref 11.5–15.5)
WBC: 10.9 K/uL — ABNORMAL HIGH (ref 4.0–10.5)
nRBC: 0 % (ref 0.0–0.2)

## 2024-05-14 LAB — PROTIME-INR
INR: 1 (ref 0.8–1.2)
Prothrombin Time: 14.2 s (ref 11.4–15.2)

## 2024-05-14 LAB — COMPREHENSIVE METABOLIC PANEL WITH GFR
ALT: 49 U/L — ABNORMAL HIGH (ref 0–44)
AST: 33 U/L (ref 15–41)
Albumin: 4.9 g/dL (ref 3.5–5.0)
Alkaline Phosphatase: 104 U/L (ref 38–126)
Anion gap: 13 (ref 5–15)
BUN: 15 mg/dL (ref 6–20)
CO2: 24 mmol/L (ref 22–32)
Calcium: 9.3 mg/dL (ref 8.9–10.3)
Chloride: 99 mmol/L (ref 98–111)
Creatinine, Ser: 0.91 mg/dL (ref 0.44–1.00)
GFR, Estimated: >60 mL/min (ref >60)
Glucose, Bld: 102 mg/dL — ABNORMAL HIGH (ref 70–99)
Potassium: 3.2 mmol/L — ABNORMAL LOW (ref 3.5–5.1)
Sodium: 136 mmol/L (ref 135–145)
Total Bilirubin: 0.6 mg/dL (ref 0.0–1.2)
Total Protein: 8.3 g/dL — ABNORMAL HIGH (ref 6.5–8.1)

## 2024-05-14 LAB — DIFFERENTIAL
Abs Immature Granulocytes: 0.1 K/uL — ABNORMAL HIGH (ref 0.00–0.07)
Basophils Absolute: 0.1 K/uL (ref 0.0–0.1)
Basophils Relative: 1 %
Eosinophils Absolute: 0.1 K/uL (ref 0.0–0.5)
Eosinophils Relative: 1 %
Immature Granulocytes: 1 %
Lymphocytes Relative: 36 %
Lymphs Abs: 3.9 K/uL (ref 0.7–4.0)
Monocytes Absolute: 0.5 K/uL (ref 0.1–1.0)
Monocytes Relative: 4 %
Neutro Abs: 6.3 K/uL (ref 1.7–7.7)
Neutrophils Relative %: 57 %

## 2024-05-14 LAB — APTT: aPTT: 39 s — ABNORMAL HIGH (ref 24–36)

## 2024-05-14 MED ORDER — ALPRAZOLAM 1 MG PO TABS
2.0000 mg | ORAL_TABLET | Freq: Four times a day (QID) | ORAL | Status: DC | PRN
  Administered 2024-05-15 – 2024-05-17 (×7): 2 mg via ORAL
  Filled 2024-05-14 (×8): qty 2

## 2024-05-14 MED ORDER — MAGNESIUM HYDROXIDE 400 MG/5ML PO SUSP: 30.0000 mL | Freq: Every day | ORAL | Status: DC | PRN

## 2024-05-14 MED ORDER — ATORVASTATIN CALCIUM 20 MG PO TABS
80.0000 mg | ORAL_TABLET | Freq: Every day | ORAL | Status: DC
  Administered 2024-05-14 – 2024-05-18 (×5): 80 mg via ORAL
  Filled 2024-05-14 (×5): qty 4

## 2024-05-14 MED ORDER — VITAMIN D (ERGOCALCIFEROL) 1.25 MG (50000 UNIT) PO CAPS: 50000.0000 [IU] | ORAL_CAPSULE | ORAL | Status: DC

## 2024-05-14 MED ORDER — TIOTROPIUM BROMIDE MONOHYDRATE 18 MCG IN CAPS: 18.0000 ug | ORAL_CAPSULE | Freq: Every day | RESPIRATORY_TRACT | Status: DC

## 2024-05-14 MED ORDER — ICOSAPENT ETHYL 1 G PO CAPS
2.0000 g | ORAL_CAPSULE | Freq: Two times a day (BID) | ORAL | Status: DC
  Administered 2024-05-15 – 2024-05-18 (×6): 2 g via ORAL
  Filled 2024-05-14 (×8): qty 2

## 2024-05-14 MED ORDER — HYDROXYZINE HCL 50 MG PO TABS
25.0000 mg | ORAL_TABLET | Freq: Every day | ORAL | Status: DC
  Administered 2024-05-14 – 2024-05-17 (×2): 25 mg via ORAL
  Filled 2024-05-14 (×4): qty 1

## 2024-05-14 MED ORDER — HYDROCODONE-ACETAMINOPHEN 5-325 MG PO TABS
1.0000 | ORAL_TABLET | Freq: Three times a day (TID) | ORAL | Status: DC | PRN
  Administered 2024-05-14 – 2024-05-15 (×2): 1 via ORAL
  Filled 2024-05-14 (×2): qty 1

## 2024-05-14 MED ORDER — EPINEPHRINE 0.3 MG/0.3ML IJ SOAJ: 0.3000 mg | Freq: Once | INTRAMUSCULAR | Status: DC | PRN

## 2024-05-14 MED ORDER — METHOCARBAMOL 500 MG PO TABS
1500.0000 mg | ORAL_TABLET | Freq: Three times a day (TID) | ORAL | Status: DC
  Administered 2024-05-14 – 2024-05-18 (×11): 1500 mg via ORAL
  Filled 2024-05-14 (×11): qty 3

## 2024-05-14 MED ORDER — ACETAMINOPHEN 650 MG RE SUPP: 650.0000 mg | Freq: Four times a day (QID) | RECTAL | Status: DC | PRN

## 2024-05-14 MED ORDER — NALOXONE HCL 4 MG/0.1ML NA LIQD: 1.0000 | Freq: Once | NASAL | Status: DC | PRN

## 2024-05-14 MED ORDER — MELATONIN 5 MG PO TABS
2.5000 mg | ORAL_TABLET | Freq: Every evening | ORAL | Status: DC | PRN
  Administered 2024-05-14: 2.5 mg via ORAL
  Filled 2024-05-14 (×2): qty 1

## 2024-05-14 MED ORDER — FUROSEMIDE 20 MG PO TABS
20.0000 mg | ORAL_TABLET | Freq: Every day | ORAL | Status: DC
  Administered 2024-05-14 – 2024-05-18 (×5): 20 mg via ORAL
  Filled 2024-05-14 (×5): qty 1

## 2024-05-14 MED ORDER — IPRATROPIUM-ALBUTEROL 0.5-2.5 (3) MG/3ML IN SOLN: 3.0000 mL | Freq: Four times a day (QID) | RESPIRATORY_TRACT | Status: DC | PRN

## 2024-05-14 MED ORDER — ENOXAPARIN SODIUM 40 MG/0.4ML IJ SOSY
40.0000 mg | PREFILLED_SYRINGE | INTRAMUSCULAR | Status: DC
  Administered 2024-05-14 – 2024-05-17 (×4): 40 mg via SUBCUTANEOUS
  Filled 2024-05-14 (×4): qty 0.4

## 2024-05-14 MED ORDER — BUDESON-GLYCOPYRROL-FORMOTEROL 160-9-4.8 MCG/ACT IN AERO: 2.0000 | INHALATION_SPRAY | Freq: Two times a day (BID) | RESPIRATORY_TRACT | Status: DC

## 2024-05-14 MED ORDER — ARIPIPRAZOLE 10 MG PO TABS: 5.0000 mg | ORAL_TABLET | Freq: Every day | ORAL | Status: DC

## 2024-05-14 MED ORDER — MONTELUKAST SODIUM 10 MG PO TABS
10.0000 mg | ORAL_TABLET | Freq: Every day | ORAL | Status: DC
  Administered 2024-05-14 – 2024-05-17 (×3): 10 mg via ORAL
  Filled 2024-05-14 (×4): qty 1

## 2024-05-14 MED ORDER — ACETAMINOPHEN 325 MG PO TABS
650.0000 mg | ORAL_TABLET | Freq: Four times a day (QID) | ORAL | Status: DC | PRN
  Administered 2024-05-15: 650 mg via ORAL
  Filled 2024-05-14: qty 2

## 2024-05-14 MED ORDER — HYDROMORPHONE HCL 1 MG/ML IJ SOLN
1.0000 mg | Freq: Once | INTRAMUSCULAR | Status: AC
  Administered 2024-05-14: 1 mg via INTRAVENOUS
  Filled 2024-05-14: qty 1

## 2024-05-14 MED ORDER — LEVOTHYROXINE SODIUM 50 MCG PO TABS
150.0000 ug | ORAL_TABLET | Freq: Every day | ORAL | Status: DC
  Administered 2024-05-15 – 2024-05-18 (×4): 150 ug via ORAL
  Filled 2024-05-14: qty 3
  Filled 2024-05-14 (×3): qty 1

## 2024-05-14 MED ORDER — PANTOPRAZOLE SODIUM 40 MG PO TBEC
40.0000 mg | DELAYED_RELEASE_TABLET | Freq: Every day | ORAL | Status: DC
  Administered 2024-05-14 – 2024-05-18 (×5): 40 mg via ORAL
  Filled 2024-05-14 (×5): qty 1

## 2024-05-14 NOTE — Assessment & Plan Note (Signed)
-   Will continue Xanax , Wellbutrin XL and Abilify .

## 2024-05-14 NOTE — H&P (Addendum)
 [place]   PATIENT NAME: Michelle Conley    MR#:  [id number]  DATE OF BIRTH:  1971-02-03  DATE OF ADMISSION:  05/14/2024  PRIMARY CARE PHYSICIAN: Ziglar, Susan K, MD   Patient is coming from: Home  REQUESTING/REFERRING PHYSICIAN: Claudene Rover, MD  CHIEF COMPLAINT:   Chief Complaint  Patient presents with   Eye Problem    HISTORY OF PRESENT ILLNESS:  Michelle Conley is a 53 y.o. 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  She had nausea and vomiting last night.  She denies any chest pain or palpitations.  No cough or wheezing or dyspnea.  She continues to smoke 1 pack of cigarettes per day.  No dysuria, anuria or hematuria or flank pain.  The patient was seen today by ophthalmology Dr. Jaye who noticed Hollenhorst plaque upon fundus exam that is concerning about CRAO.  He also noticed that she was having new left 3rd and 6th cranial nerve palsies  ED Course: When the patient came to the ER, BP was 145/76 with otherwise normal vital signs.  Labs revealed.  Labs revealed mild hypokalemia 3.2 and ALT 49 with otherwise unremarkable CMP.  CBC showed WBCs of 10.9.  Coag profile showed a PTT of 39 with normal PT and INR. EKG as reviewed by me : None. Imaging: Noncontrast head CT scan showed no acute ventricular abnormality.  Brain MRI and MRA is currently pending.  The patient was given 1 mg of IV Dilaudid .  She will be admitted to an observation medical telemetry bed for further evaluation and management. PAST MEDICAL HISTORY:   Past Medical History:  Diagnosis Date   Abnormal chest xray    Reports that she has spots on  her lungs   Anemia    Anxiety    Aortic valve disorder    Aortic valve insufficiency    Asthma    Cervical dysplasia    Complication of anesthesia    reports that she comes out of it slowly   COPD (chronic obstructive pulmonary disease) (HCC)    Cranial nerve palsy    Depression    GERD (gastroesophageal reflux disease)    Goiter    History of kidney stones    History of meningitis    History of viral encephalitis    Hypertension    Hypothyroidism    Migraine headache    Post traumatic stress disorder (PTSD)    Scarlet fever    Shortness of breath dyspnea    Stroke (HCC)    Syncope and collapse   -  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, 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 per her report  PAST SURGICAL HISTORY:   Past Surgical History:  Procedure Laterality Date   ABDOMINAL SURGERY     CESAREAN SECTION  11/06/1995   CHOLECYSTECTOMY  11/05/2013   COLONOSCOPY WITH PROPOFOL  N/A 07/10/2023   Procedure: COLONOSCOPY WITH PROPOFOL ;  Surgeon: Unk Corinn Skiff, MD;  Location: [facility] ENDOSCOPY;  Service: Gastroenterology;  Laterality: N/A;   LINGUAL FRENECTOMY N/A 06/20/2015   Procedure: UPPER LABIAL L FRENECTOMY;  Surgeon: Lonni Sax, DDS;  Location: [facility] OR;  Service: Oral Surgery;  Laterality: N/A;   MULTIPLE EXTRACTIONS WITH ALVEOLOPLASTY  N/A 06/20/2015   Procedure: EXTRACTION  OF TEETH NUMBERS 2-15, 19-30,ALVEOLOPLASTY OF MAXILLA  AND MANDIBLE;  Surgeon: Lonni Sax, DDS;  Location: [facility] OR;  Service: Oral Surgery;  Laterality: N/A;   NASAL SEPTUM SURGERY     POLYPECTOMY  07/10/2023   Procedure: POLYPECTOMY INTESTINAL;  Surgeon: Unk Corinn Skiff, MD;  Location: [facility] ENDOSCOPY;  Service: Gastroenterology;;    SOCIAL HISTORY:   Social History   Tobacco Use   Smoking status: Some Days    Current packs/day: 1.00    Average packs/day: 1 pack/day for 30.0 years (30.0 ttl pk-yrs)    Types: Cigarettes    Passive exposure: Current   Smokeless tobacco: Never  Substance Use Topics    Alcohol  use: No    FAMILY HISTORY:   Family History  Problem Relation Age of Onset   Lung cancer Mother    Esophageal cancer Mother    Diabetes Maternal Grandmother    Stroke Maternal Grandmother     DRUG ALLERGIES:   Allergies  Allergen Reactions   Bee Pollen Anaphylaxis and Swelling   Paroxetine Hcl Shortness Of Breath    Other reaction(s): OTHER   Rofecoxib Other (See Comments) and Anaphylaxis    vioxx  Other reaction(s): Other (See Comments), Unknown  vioxx   Wasp Venom Anaphylaxis and Swelling   Penicillins Nausea And Vomiting, Rash and Other (See Comments)    Other reaction(s): HIVES  Other Reaction: Not Assessed   Tramadol Other (See Comments), Nausea Only and Rash    Other Reaction: Not Assessed  Other reaction(s): Other (See Comments)  Other Reaction: Not Assessed  Other Reaction: Not Assessed   Codeine     Other reaction(s): ITCHING   Esomeprazole Magnesium  Other (See Comments)    Headaches   Fluoxetine Other (See Comments)   Ketorolac  Other (See Comments)    Migraine  Other reaction(s): Other (See Comments), Unknown  Pt reports causes rebound migraines   Meloxicam Other (See Comments)   Toradol  [Ketorolac  Tromethamine ] Other (See Comments)    Pt reports causes rebound migraines   Trazodone     Other reaction(s): OTHER   Amitriptyline Nausea And Vomiting   Cephalexin Rash    Genital Itching   Cyclobenzaprine Other (See Comments) and Itching    Migraine  Other reaction(s): Unknown   Metronidazole Other (See Comments) and Nausea Only    Other Reaction: Not Assessed  Migraine   Other reaction(s): Other (See Comments)  Other Reaction: Not Assessed  Other Reaction: Not Assessed  Other Reaction: Not Assessed    REVIEW OF SYSTEMS:   ROS As per history of present illness. All pertinent systems were reviewed above. Constitutional, HEENT, cardiovascular, respiratory, GI, GU, musculoskeletal, neuro, psychiatric, endocrine, integumentary and  hematologic systems were reviewed and are otherwise negative/unremarkable except for positive findings mentioned above in the HPI.   MEDICATIONS AT HOME:   Prior to Admission medications   Medication Sig Start Date End Date Taking? Authorizing Provider  acetaminophen  (TYLENOL ) 500 MG tablet Take by mouth.    [provider]  albuterol  (VENTOLIN  HFA) 108 (90 Base) MCG/ACT inhaler Inhale 2 puffs into the lungs every 6 (six) hours as needed for wheezing or shortness of breath. 03/13/24   Ziglar, Susan K, MD  alprazolam  (XANAX ) 2 MG tablet Take 2 mg by mouth 4 (four) times daily.    [provider]  ARIPiprazole  (ABILIFY ) 5 MG tablet Take by mouth. 09/01/23   [provider]  atorvastatin  (LIPITOR) 80 MG tablet Take 1 tablet (  80 mg total) by mouth daily. 03/13/24   Ziglar, Susan K, MD  buPROPion ER (WELLBUTRIN SR) 100 MG 12 hr tablet Take 100 mg by mouth 2 (two) times daily. Patient not taking: Reported on 04/30/2024 10/28/23   [provider]  clobetasol (TEMOVATE) 0.05 % external solution Apply topically daily. Patient not taking: Reported on 04/30/2024 11/05/23   [provider]  DUPIXENT 300 MG/2ML SOAJ Inject into the skin. 06/26/23   [provider]  EPINEPHrine  0.3 mg/0.3 mL IJ SOAJ injection Inject 0.3 mg into the muscle once as needed (allergic reaction).    [provider]  escitalopram  (LEXAPRO ) 20 MG tablet Take 20 mg by mouth daily.    [provider]  Fluticasone-Umeclidin-Vilant (TRELEGY ELLIPTA ) 100-62.5-25 MCG/ACT AEPB Inhale 1 Inhalation into the lungs daily. 03/13/24   Ziglar, Susan K, MD  furosemide  (LASIX ) 20 MG tablet Take 1 tablet (20 mg total) by mouth daily. 02/17/24   Ziglar, Susan K, MD  HYDROcodone -acetaminophen  (NORCO/VICODIN) 5-325 MG tablet Take 1 tablet by mouth every 8 (eight) hours as needed for moderate pain (pain score 4-6). 04/30/24   Ziglar, Susan K, MD  hydrOXYzine  (ATARAX ) 25 MG tablet TAKE 1 TABLET  BY MOUTH ONCE DAILY AT NIGHT AT BEDTIME 05/06/23   [provider]  ibuprofen  (ADVIL ) 800 MG tablet Take 1 tablet (800 mg total) by mouth every 8 (eight) hours as needed for mild pain (pain score 1-3) or moderate pain (pain score 4-6). 04/10/24   Ziglar, Susan K, MD  icosapent  Ethyl (VASCEPA ) 1 g capsule Take 4 capsules (4 g total) by mouth 2 (two) times daily. 02/17/24   Ziglar, Susan K, MD  ipratropium-albuterol  (DUONEB) 0.5-2.5 (3) MG/3ML SOLN Inhale into the lungs. 02/21/22   [provider]  levothyroxine  (SYNTHROID ) 150 MCG tablet Take 1 tablet (150 mcg total) by mouth daily. 03/13/24   Ziglar, Susan K, MD  methocarbamol  (ROBAXIN ) 750 MG tablet Take 2 tablets (1,500 mg total) by mouth 3 (three) times daily. 02/17/24   Ziglar, Susan K, MD  montelukast  (SINGULAIR ) 10 MG tablet Take 1 tablet (10 mg total) by mouth at bedtime. 03/13/24   Ziglar, Susan K, MD  Na Sulfate-K Sulfate-Mg Sulfate concentrate 17.5-3.13-1.6 GM/177ML SOLN Take by mouth. 06/18/23   [provider]  naloxone  (NARCAN ) nasal spray 4 mg/0.1 mL Call 911. Administer a single spray in one nostril. If no or minimal response after 2 to 3 minutes, an additional dose may be given in the alternate nostril. 06/04/23   [provider]  nicotine (NICODERM CQ - DOSED IN MG/24 HOURS) 21 mg/24hr patch Place onto the skin. Patient not taking: Reported on 04/30/2024 09/13/21   [provider]  omeprazole  (PRILOSEC) 20 MG capsule Take 1 capsule (20 mg total) by mouth daily. 03/13/24   Ziglar, Susan K, MD  promethazine  (PHENERGAN ) 25 MG tablet Take 1 tablet (25 mg total) by mouth every 6 (six) hours as needed. 04/01/24   Ziglar, Susan K, MD  simvastatin  (ZOCOR ) 20 MG tablet Take 1 tablet (20 mg total) by mouth every evening. 02/17/24   Ziglar, Susan K, MD  Spacer/Aero-Holding Chambers (AEROCHAMBER MV) inhaler Use as instructed 02/17/24   Ziglar, Susan K, MD  tiotropium (SPIRIVA ) 18 MCG inhalation capsule Place 1 capsule (18  mcg total) into inhaler and inhale daily. 02/17/24   Ziglar, Susan K, MD  Vitamin D , Ergocalciferol , (DRISDOL ) 1.25 MG (50000 UNIT) CAPS capsule Take 1 capsule (50,000 Units total) by mouth every 7 (seven) days. 03/13/24  Ziglar, Devere POUR, MD      VITAL SIGNS:  Blood pressure 119/69, pulse (!) 57, temperature 98.4 F (36.9 C), temperature source Oral, resp. rate 10, height 4' 10 (1.473 m), weight 70 kg, last menstrual period 06/02/2015, SpO2 93%.  PHYSICAL EXAMINATION:  Physical Exam  GENERAL:  53 y.o.-year-old Caucasian female patient lying in the bed with no acute distress.  EYES: Pupils equal, round, reactive to light.  She has left 3rd cranial nerve palsy. No scleral icterus. Extraocular muscles intact.  HEENT: Head atraumatic, normocephalic. Oropharynx and nasopharynx clear.  NECK:  Supple, no jugular venous distention. No thyroid  enlargement, no tenderness.  LUNGS: Normal breath sounds bilaterally, no wheezing, rales,rhonchi or crepitation. No use of accessory muscles of respiration.  CARDIOVASCULAR: Regular rate and rhythm, S1, S2 normal. No murmurs, rubs, or gallops.  ABDOMEN: Soft, nondistended, nontender. Bowel sounds present. No organomegaly or mass.  EXTREMITIES: No pedal edema, cyanosis, or clubbing.  NEUROLOGIC: She has left 3rd, 7th and 11th cranial nerve palsies.  Muscle strength 5/5 in all extremities. Sensation intact. Gait not checked.  PSYCHIATRIC: The patient is alert and oriented x 3.  Normal affect and good eye contact. SKIN: No obvious rash, lesion, or ulcer.   LABORATORY PANEL:   CBC Recent Labs  Lab 05/14/24 1752  WBC 10.9*  HGB 14.4  HCT 42.2  PLT 275   ------------------------------------------------------------------------------------------------------------------  Chemistries  Recent Labs  Lab 05/14/24 1752  NA 136  K 3.2*  CL 99  CO2 24  GLUCOSE 102*  BUN 15  CREATININE 0.91  CALCIUM  9.3  AST 33  ALT 49*  ALKPHOS 104  BILITOT 0.6    ------------------------------------------------------------------------------------------------------------------  Cardiac Enzymes No results for input(s): TROPONINI in the last 168 hours. ------------------------------------------------------------------------------------------------------------------  RADIOLOGY:  MR ANGIO HEAD WO CONTRAST Result Date: 05/14/2024 CLINICAL DATA:  Central retinal artery occlusion, cranial nerve 3 and 6 palsy, ptosis. Evaluation for stroke and arterial occlusion. EXAM: MRI HEAD WITHOUT CONTRAST MRA HEAD WITHOUT CONTRAST TECHNIQUE: Multiplanar, multi-echo pulse sequences of the brain and surrounding structures were acquired without intravenous contrast. Angiographic images of the Circle of Willis were acquired using MRA technique without intravenous contrast. COMPARISON:  Earlier same day CT head. MR head and MR venogram 04/06/2024. FINDINGS: MRI HEAD FINDINGS Brain: No acute infarct. No evidence of intracranial hemorrhage. Focal T2/FLAIR hyperintensity within the right paramedian pons is new from the prior MRI. White matter is otherwise unremarkable. No edema, mass effect, or midline shift. Posterior fossa is unremarkable. Normal appearance of midline structures. The basilar cisterns are patent. No extra-axial fluid collections. Ventricles: Normal size and configuration of the ventricles. Vascular: Skull base flow voids are visualized. Skull and upper cervical spine: No focal abnormality. Sinuses/Orbits: Orbits are symmetric. Mucous retention cyst in the posterior right maxillary sinus. Small cystic focus within the anterior nasal septum. Other: Bilateral mastoid effusions. Similar hypertrophy of the adenoids. MRA HEAD FINDINGS Anterior circulation: The intracranial internal carotid arteries are patent bilaterally without significant irregularity or stenosis. The right M1 segment and MCA bifurcation are patent. Proximal M2 branches of the right MCA are patent. Distal  right MCA branches are patent. The left M1 segment and left MCA bifurcation are patent. Proximal left M2 branches are patent. Distal left MCA branches are patent. The A1 and A2 segments are patent bilaterally. Distal ACA branches are patent. Posterior circulation: The visualized intracranial vertebral arteries are patent. Left vertebral artery is dominant. The basilar artery is patent. The bilateral posterior cerebral arteries are patent. Posterior communicating  artery visualized on the right. Superior cerebellar arteries are patent bilaterally. AICA visualized on the right. PICA partially visualized on the left. Anatomic variants: No significant variant. IMPRESSION: No acute intracranial abnormality. Patent intracranial arterial vasculature. Small focus of T2 hyperintensity within the right paramedian pons is new since 04/06/2024 and may reflect a focus of remote infarct versus chronic microvascular ischemic change. Electronically Signed   By: Donnice Mania M.D.   On: 05/14/2024 22:26   MR BRAIN WO CONTRAST Result Date: 05/14/2024 CLINICAL DATA:  Central retinal artery occlusion, cranial nerve 3 and 6 palsy, ptosis. Evaluation for stroke and arterial occlusion. EXAM: MRI HEAD WITHOUT CONTRAST MRA HEAD WITHOUT CONTRAST TECHNIQUE: Multiplanar, multi-echo pulse sequences of the brain and surrounding structures were acquired without intravenous contrast. Angiographic images of the Circle of Willis were acquired using MRA technique without intravenous contrast. COMPARISON:  Earlier same day CT head. MR head and MR venogram 04/06/2024. FINDINGS: MRI HEAD FINDINGS Brain: No acute infarct. No evidence of intracranial hemorrhage. Focal T2/FLAIR hyperintensity within the right paramedian pons is new from the prior MRI. White matter is otherwise unremarkable. No edema, mass effect, or midline shift. Posterior fossa is unremarkable. Normal appearance of midline structures. The basilar cisterns are patent. No extra-axial  fluid collections. Ventricles: Normal size and configuration of the ventricles. Vascular: Skull base flow voids are visualized. Skull and upper cervical spine: No focal abnormality. Sinuses/Orbits: Orbits are symmetric. Mucous retention cyst in the posterior right maxillary sinus. Small cystic focus within the anterior nasal septum. Other: Bilateral mastoid effusions. Similar hypertrophy of the adenoids. MRA HEAD FINDINGS Anterior circulation: The intracranial internal carotid arteries are patent bilaterally without significant irregularity or stenosis. The right M1 segment and MCA bifurcation are patent. Proximal M2 branches of the right MCA are patent. Distal right MCA branches are patent. The left M1 segment and left MCA bifurcation are patent. Proximal left M2 branches are patent. Distal left MCA branches are patent. The A1 and A2 segments are patent bilaterally. Distal ACA branches are patent. Posterior circulation: The visualized intracranial vertebral arteries are patent. Left vertebral artery is dominant. The basilar artery is patent. The bilateral posterior cerebral arteries are patent. Posterior communicating artery visualized on the right. Superior cerebellar arteries are patent bilaterally. AICA visualized on the right. PICA partially visualized on the left. Anatomic variants: No significant variant. IMPRESSION: No acute intracranial abnormality. Patent intracranial arterial vasculature. Small focus of T2 hyperintensity within the right paramedian pons is new since 04/06/2024 and may reflect a focus of remote infarct versus chronic microvascular ischemic change. Electronically Signed   By: Donnice Mania M.D.   On: 05/14/2024 22:26   CT HEAD WO CONTRAST Result Date: 05/14/2024 CLINICAL DATA:  Headache, neuro deficit, vision difficulty for the last month. EXAM: CT HEAD WITHOUT CONTRAST TECHNIQUE: Contiguous axial images were obtained from the base of the skull through the vertex without intravenous  contrast. RADIATION DOSE REDUCTION: This exam was performed according to the departmental dose-optimization program which includes automated exposure control, adjustment of the mA and/or kV according to patient size and/or use of iterative reconstruction technique. COMPARISON:  CT head and MRI head 04/06/2024. FINDINGS: Brain: No acute intracranial hemorrhage. No CT evidence of acute infarct. No edema, mass effect, or midline shift. The basilar cisterns are patent. Ventricles: The ventricles are normal. Vascular: No hyperdense vessel or unexpected calcification. Skull: No acute or aggressive finding. Orbits: Orbits are symmetric. Sinuses: Mild mucosal thickening in the partially visualized right maxillary sinus. Other: Small bilateral mastoid  effusions similar to prior. IMPRESSION: No CT evidence of acute intracranial abnormality. Electronically Signed   By: Donnice Mania M.D.   On: 05/14/2024 18:39      IMPRESSION AND PLAN:  Assessment and Plan: * Acute CVA (cerebrovascular accident) [facility]) - The patient has been having recent diplopia. 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 - The patient will be admitted to an observation medically monitored bed.   - We will follow neuro checks q.4 hours for 24 hours.   - The patient will be placed on aspirin .   - Will obtain a brain MRI and MRA as mentioned above. - A neurology consultation  as well as physical/occupation/speech therapy consults will be obtained in a.m.SABRA - I notified Dr. Arora about the patient - The patient will be placed on statin therapy and fasting lipids will be checked.   Hypokalemia Will replace potassium.  Asthma, chronic - Will continue Singulair  as well as as needed DuoNebs. - Will hold off long-acting beta agonist for now  Dyslipidemia - She will continue statin therapy.  GERD without esophagitis - Continue PPI  therapy.  Hypothyroidism - Will continue Synthroid .  Anxiety and depression - Will continue Xanax , Wellbutrin XL and Abilify .   DVT prophylaxis: Lovenox .  Advanced Care Planning:  Code Status: full code.  Family Communication:  The plan of care was discussed in details with the patient (and family). I answered all questions. The patient agreed to proceed with the above mentioned plan. Further management will depend upon hospital course. Disposition Plan: Back to previous home environment Consults called: Neurology All the records are reviewed and case discussed with ED provider.  Status is: Observation  I certify that at the time of admission, it is my clinical judgment that the patient will require hospital care extending less than 2 midnights.                            Dispo: The patient is from: Home              Anticipated d/c is to: Home              Patient currently is not medically stable to d/c.              Difficult to place patient: No  [place] DELENA Peaches M.D on 05/15/2024 at 1:28 AM  [facility]   From 7 PM-7 AM, contact night-coverage [email or web address]  CC: Primary care physician; Ziglar, Susan K, MD

## 2024-05-14 NOTE — Assessment & Plan Note (Signed)
 Continue PPI therapy.

## 2024-05-14 NOTE — ED Provider Notes (Signed)
 [facility] Provider Note    Event Date/Time   First MD Initiated Contact with Patient 05/14/24 1835     (approximate)   History   Eye Problem   HPI  Michelle Conley is a 53 y.o. female who presents to the ED for evaluation of Eye Problem   Review of routine PCP visit from 2 weeks ago, 6/26.  History of chronic headaches, anxiety, hypothyroidism and HTN.  During ED visit 1 month ago she had MRI brain without contrast, MR venogram without evidence of acute pathology.  Patient presents to the ED from a local ophthalmology clinic for evaluation of possible CRAO and for admission for stroke workup.  As indicated below, I do consult the ophthalmologist who sent her over.   Patient reports 1-2 months of poorly controlled headaches, blurry vision and a lazy eye on her left side that have been constant.   Physical Exam   Triage Vital Signs: ED Triage Vitals [05/14/24 1744]  Encounter Vitals Group     BP (!) 145/76     Girls Systolic BP Percentile      Girls Diastolic BP Percentile      Boys Systolic BP Percentile      Boys Diastolic BP Percentile      Pulse Rate 87     Resp 18     Temp 97.9 F (36.6 C)     Temp Source Oral     SpO2 98 %     Weight 154 lb 5.2 oz (70 kg)     Height 4' 10 (1.473 m)     Head Circumference      Peak Flow      Pain Score 10     Pain Loc      Pain Education      Exclude from Growth Chart     Most recent vital signs: Vitals:   05/14/24 1744 05/14/24 1943  BP: (!) 145/76 117/68  Pulse: 87 84  Resp: 18 17  Temp: 97.9 F (36.6 C) 98.2 F (36.8 C)  SpO2: 98% 99%    General: Awake, no distress.  CV:  Good peripheral perfusion.  Resp:  Normal effort.  Abd:  No distention.  MSK:  No deformity noted.  Neuro:  No focal deficits appreciated. Other:  Left-sided ptosis and CN III palsy.  No evidence of weakness to the extremities   ED Results / Procedures / Treatments   Labs (all labs ordered are listed, but  only abnormal results are displayed) Labs Reviewed  APTT - Abnormal; Notable for the following components:      Result Value   aPTT 39 (*)    All other components within normal limits  CBC - Abnormal; Notable for the following components:   WBC 10.9 (*)    All other components within normal limits  DIFFERENTIAL - Abnormal; Notable for the following components:   Abs Immature Granulocytes 0.10 (*)    All other components within normal limits  COMPREHENSIVE METABOLIC PANEL WITH GFR - Abnormal; Notable for the following components:   Potassium 3.2 (*)    Glucose, Bld 102 (*)    Total Protein 8.3 (*)    ALT 49 (*)    All other components within normal limits  PROTIME-INR  ETHANOL    EKG   RADIOLOGY CT head interpreted by me without evidence of acute intracranial pathology  Official radiology report(s): CT HEAD WO CONTRAST Result Date: 05/14/2024 CLINICAL DATA:  Headache, neuro deficit, vision  difficulty for the last month. EXAM: CT HEAD WITHOUT CONTRAST TECHNIQUE: Contiguous axial images were obtained from the base of the skull through the vertex without intravenous contrast. RADIATION DOSE REDUCTION: This exam was performed according to the departmental dose-optimization program which includes automated exposure control, adjustment of the mA and/or kV according to patient size and/or use of iterative reconstruction technique. COMPARISON:  CT head and MRI head 04/06/2024. FINDINGS: Brain: No acute intracranial hemorrhage. No CT evidence of acute infarct. No edema, mass effect, or midline shift. The basilar cisterns are patent. Ventricles: The ventricles are normal. Vascular: No hyperdense vessel or unexpected calcification. Skull: No acute or aggressive finding. Orbits: Orbits are symmetric. Sinuses: Mild mucosal thickening in the partially visualized right maxillary sinus. Other: Small bilateral mastoid effusions similar to prior. IMPRESSION: No CT evidence of acute intracranial  abnormality. Electronically Signed   By: Donnice Mania M.D.   On: 05/14/2024 18:39    PROCEDURES and INTERVENTIONS:  .Critical Care  Performed by: Claudene Rover, MD Authorized by: Claudene Rover, MD   Critical care provider statement:    Critical care time (minutes):  30   Critical care time was exclusive of:  Separately billable procedures and treating other patients   Critical care was necessary to treat or prevent imminent or life-threatening deterioration of the following conditions:  CNS failure or compromise   Critical care was time spent personally by me on the following activities:  Development of treatment plan with patient or surrogate, discussions with consultants, evaluation of patient's response to treatment, examination of patient, ordering and review of laboratory studies, ordering and review of radiographic studies, ordering and performing treatments and interventions, pulse oximetry, re-evaluation of patient's condition and review of old charts   Medications  HYDROmorphone  (DILAUDID ) injection 1 mg (has no administration in time range)     IMPRESSION / MDM / ASSESSMENT AND PLAN / ED COURSE  I reviewed the triage vital signs and the nursing notes.  Differential diagnosis includes, but is not limited to, cerebral stroke, CRAO, ICH or retrobulbar hematoma, complex migraine  {Patient presents with symptoms of an acute illness or injury that is potentially life-threatening.  Patient presents with signs of possible CRAO requiring medical admission.  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  I ordered MRI/MRA after negative CT head.  Provide parenteral analgesia.  Consult medicine for admission  Clinical Course as of 05/14/24 1945  Thu May 14, 2024  1857 I consult with Dr. 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   Claudene Rover, MD 05/14/24 [phone]

## 2024-05-14 NOTE — Assessment & Plan Note (Signed)
-   She will continue statin therapy.

## 2024-05-14 NOTE — ED Triage Notes (Signed)
 Pt to ER states she was seen at [facility] and was told to come to ER immediately because I'm fixing to have an aneurism or a massive stroke.  Pt states she has been dealing with vision difficulty for last month and that today she was at the eye doctor and he told her she had cranial nerve palsies and that it was a sign of an immediate emergency. Dr. Clarine to triage 3 to assess pt.  No need for code stroke at this time.

## 2024-05-14 NOTE — ED Notes (Signed)
 Pt in MRI.

## 2024-05-14 NOTE — Assessment & Plan Note (Signed)
-   Will continue Singulair  as well as as needed DuoNebs. - Will hold off long-acting beta agonist for now

## 2024-05-14 NOTE — Assessment & Plan Note (Signed)
-   The patient has been having recent diplopia. 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 - The patient will be admitted to an observation medically monitored bed.   - We will follow neuro checks q.4 hours for 24 hours.   - The patient will be placed on aspirin.   - Will obtain a brain MRI and MRA as mentioned above. - A neurology consultation  as well as physical/occupation/speech therapy consults will be obtained in a.m.SABRA - I notified Dr. Arora about the patient - The patient will be placed on statin therapy and fasting lipids will be checked.

## 2024-05-14 NOTE — Assessment & Plan Note (Signed)
-   Will continue Synthroid.

## 2024-05-14 NOTE — ED Notes (Signed)
 Pt spoke with MRI staff on phone regarding screening questions

## 2024-05-15 ENCOUNTER — Other Ambulatory Visit: Payer: Self-pay | Admitting: Family Medicine

## 2024-05-15 ENCOUNTER — Encounter (HOSPITAL_BASED_OUTPATIENT_CLINIC_OR_DEPARTMENT_OTHER): Payer: Self-pay

## 2024-05-15 ENCOUNTER — Inpatient Hospital Stay

## 2024-05-15 ENCOUNTER — Observation Stay

## 2024-05-15 DIAGNOSIS — Z880 Allergy status to penicillin: Secondary | ICD-10-CM | POA: Diagnosis not present

## 2024-05-15 DIAGNOSIS — G629 Polyneuropathy, unspecified: Secondary | ICD-10-CM | POA: Diagnosis not present

## 2024-05-15 DIAGNOSIS — H3412 Central retinal artery occlusion, left eye: Secondary | ICD-10-CM

## 2024-05-15 DIAGNOSIS — H4922 Sixth [abducent] nerve palsy, left eye: Secondary | ICD-10-CM | POA: Diagnosis present

## 2024-05-15 DIAGNOSIS — F4310 Post-traumatic stress disorder, unspecified: Secondary | ICD-10-CM | POA: Diagnosis present

## 2024-05-15 DIAGNOSIS — Z8673 Personal history of transient ischemic attack (TIA), and cerebral infarction without residual deficits: Secondary | ICD-10-CM

## 2024-05-15 DIAGNOSIS — H3410 Central retinal artery occlusion, unspecified eye: Secondary | ICD-10-CM | POA: Diagnosis present

## 2024-05-15 DIAGNOSIS — H4902 Third [oculomotor] nerve palsy, left eye: Secondary | ICD-10-CM | POA: Diagnosis present

## 2024-05-15 DIAGNOSIS — E669 Obesity, unspecified: Secondary | ICD-10-CM | POA: Diagnosis present

## 2024-05-15 DIAGNOSIS — Z79890 Hormone replacement therapy (postmenopausal): Secondary | ICD-10-CM | POA: Diagnosis not present

## 2024-05-15 DIAGNOSIS — I69398 Other sequelae of cerebral infarction: Secondary | ICD-10-CM | POA: Diagnosis not present

## 2024-05-15 DIAGNOSIS — H02402 Unspecified ptosis of left eyelid: Secondary | ICD-10-CM

## 2024-05-15 DIAGNOSIS — F419 Anxiety disorder, unspecified: Secondary | ICD-10-CM | POA: Diagnosis present

## 2024-05-15 DIAGNOSIS — Z886 Allergy status to analgesic agent status: Secondary | ICD-10-CM | POA: Diagnosis not present

## 2024-05-15 DIAGNOSIS — E876 Hypokalemia: Secondary | ICD-10-CM

## 2024-05-15 DIAGNOSIS — I10 Essential (primary) hypertension: Secondary | ICD-10-CM | POA: Diagnosis present

## 2024-05-15 DIAGNOSIS — Z881 Allergy status to other antibiotic agents status: Secondary | ICD-10-CM | POA: Diagnosis not present

## 2024-05-15 DIAGNOSIS — K219 Gastro-esophageal reflux disease without esophagitis: Secondary | ICD-10-CM | POA: Diagnosis present

## 2024-05-15 DIAGNOSIS — Z888 Allergy status to other drugs, medicaments and biological substances status: Secondary | ICD-10-CM | POA: Diagnosis not present

## 2024-05-15 DIAGNOSIS — I639 Cerebral infarction, unspecified: Secondary | ICD-10-CM | POA: Diagnosis not present

## 2024-05-15 DIAGNOSIS — F17210 Nicotine dependence, cigarettes, uncomplicated: Secondary | ICD-10-CM | POA: Diagnosis present

## 2024-05-15 DIAGNOSIS — Z91030 Bee allergy status: Secondary | ICD-10-CM | POA: Diagnosis not present

## 2024-05-15 DIAGNOSIS — E785 Hyperlipidemia, unspecified: Secondary | ICD-10-CM | POA: Diagnosis present

## 2024-05-15 DIAGNOSIS — E039 Hypothyroidism, unspecified: Secondary | ICD-10-CM | POA: Diagnosis present

## 2024-05-15 DIAGNOSIS — Z885 Allergy status to narcotic agent status: Secondary | ICD-10-CM | POA: Diagnosis not present

## 2024-05-15 DIAGNOSIS — J4489 Other specified chronic obstructive pulmonary disease: Secondary | ICD-10-CM | POA: Diagnosis present

## 2024-05-15 DIAGNOSIS — R21 Rash and other nonspecific skin eruption: Secondary | ICD-10-CM

## 2024-05-15 DIAGNOSIS — R29810 Facial weakness: Secondary | ICD-10-CM | POA: Diagnosis present

## 2024-05-15 DIAGNOSIS — Z6832 Body mass index (BMI) 32.0-32.9, adult: Secondary | ICD-10-CM | POA: Diagnosis not present

## 2024-05-15 DIAGNOSIS — F32A Depression, unspecified: Secondary | ICD-10-CM | POA: Diagnosis present

## 2024-05-15 DIAGNOSIS — Z833 Family history of diabetes mellitus: Secondary | ICD-10-CM | POA: Diagnosis not present

## 2024-05-15 LAB — BASIC METABOLIC PANEL WITH GFR
Anion gap: 10 (ref 5–15)
BUN: 16 mg/dL (ref 6–20)
CO2: 24 mmol/L (ref 22–32)
Calcium: 9 mg/dL (ref 8.9–10.3)
Chloride: 101 mmol/L (ref 98–111)
Creatinine, Ser: 0.98 mg/dL (ref 0.44–1.00)
GFR, Estimated: >60 mL/min (ref >60)
Glucose, Bld: 100 mg/dL — ABNORMAL HIGH (ref 70–99)
Potassium: 4.6 mmol/L (ref 3.5–5.1)
Sodium: 135 mmol/L (ref 135–145)

## 2024-05-15 LAB — CBC
HCT: 41.5 % (ref 36.0–46.0)
Hemoglobin: 14.1 g/dL (ref 12.0–15.0)
MCH: 30.8 pg (ref 26.0–34.0)
MCHC: 34 g/dL (ref 30.0–36.0)
MCV: 90.6 fL (ref 80.0–100.0)
Platelets: 292 K/uL (ref 150–400)
RBC: 4.58 MIL/uL (ref 3.87–5.11)
RDW: 13.1 % (ref 11.5–15.5)
WBC: 12.8 K/uL — ABNORMAL HIGH (ref 4.0–10.5)
nRBC: 0 % (ref 0.0–0.2)

## 2024-05-15 LAB — HIV ANTIBODY (ROUTINE TESTING W REFLEX): HIV Screen 4th Generation wRfx: NONREACTIVE

## 2024-05-15 LAB — ETHANOL: Alcohol, Ethyl (B): <15 mg/dL (ref <15)

## 2024-05-15 MED ORDER — SODIUM CHLORIDE 0.9 % IV SOLN
12.5000 mg | INTRAVENOUS | Status: DC | PRN
  Administered 2024-05-15: 12.5 mg via INTRAVENOUS
  Filled 2024-05-15: qty 12.5

## 2024-05-15 MED ORDER — CLOPIDOGREL BISULFATE 75 MG PO TABS
75.0000 mg | ORAL_TABLET | Freq: Every day | ORAL | Status: DC
  Administered 2024-05-15 – 2024-05-18 (×4): 75 mg via ORAL
  Filled 2024-05-15 (×4): qty 1

## 2024-05-15 MED ORDER — METOCLOPRAMIDE HCL 5 MG/ML IJ SOLN
10.0000 mg | Freq: Three times a day (TID) | INTRAMUSCULAR | Status: DC
  Administered 2024-05-15 – 2024-05-18 (×11): 10 mg via INTRAVENOUS
  Filled 2024-05-15 (×11): qty 2

## 2024-05-15 MED ORDER — ASPIRIN 81 MG PO TBEC
81.0000 mg | DELAYED_RELEASE_TABLET | Freq: Every day | ORAL | Status: DC
  Administered 2024-05-15 – 2024-05-18 (×4): 81 mg via ORAL
  Filled 2024-05-15 (×4): qty 1

## 2024-05-15 MED ORDER — MORPHINE SULFATE (PF) 2 MG/ML IV SOLN: 2.0000 mg | INTRAVENOUS | Status: DC | PRN

## 2024-05-15 MED ORDER — OXYCODONE HCL 5 MG PO TABS
5.0000 mg | ORAL_TABLET | ORAL | Status: DC | PRN
  Administered 2024-05-15: 10 mg via ORAL
  Administered 2024-05-15: 5 mg via ORAL
  Administered 2024-05-16 – 2024-05-17 (×8): 10 mg via ORAL
  Administered 2024-05-18: 5 mg via ORAL
  Filled 2024-05-15 (×2): qty 2
  Filled 2024-05-15: qty 1
  Filled 2024-05-15 (×6): qty 2
  Filled 2024-05-15: qty 1
  Filled 2024-05-15: qty 2

## 2024-05-15 MED ORDER — GADOBUTROL 1 MMOL/ML IV SOLN
7.0000 mL | Freq: Once | INTRAVENOUS | Status: AC | PRN
  Administered 2024-05-15: 7 mL via INTRAVENOUS

## 2024-05-15 MED ORDER — SODIUM CHLORIDE 0.9 % IV SOLN
12.5000 mg | Freq: Four times a day (QID) | INTRAVENOUS | Status: DC | PRN
  Administered 2024-05-15: 12.5 mg via INTRAVENOUS
  Filled 2024-05-15: qty 12.5

## 2024-05-15 MED ORDER — SODIUM CHLORIDE 0.9 % IV SOLN
25.0000 mg | INTRAVENOUS | Status: DC | PRN
  Administered 2024-05-15 – 2024-05-17 (×4): 25 mg via INTRAVENOUS
  Filled 2024-05-15: qty 1
  Filled 2024-05-15 (×3): qty 25

## 2024-05-15 MED ORDER — MORPHINE SULFATE (PF) 2 MG/ML IV SOLN
2.0000 mg | Freq: Once | INTRAVENOUS | Status: AC
  Administered 2024-05-15: 2 mg via INTRAVENOUS
  Filled 2024-05-15: qty 1

## 2024-05-15 MED ORDER — POTASSIUM CHLORIDE 20 MEQ PO PACK
40.0000 meq | PACK | Freq: Once | ORAL | Status: AC
  Administered 2024-05-15: 40 meq via ORAL
  Filled 2024-05-15: qty 2

## 2024-05-15 MED ORDER — IOHEXOL 350 MG/ML SOLN
75.0000 mL | Freq: Once | INTRAVENOUS | Status: AC | PRN
  Administered 2024-05-15: 75 mL via INTRAVENOUS

## 2024-05-15 MED ORDER — ONDANSETRON HCL 4 MG/2ML IJ SOLN
4.0000 mg | Freq: Once | INTRAMUSCULAR | Status: AC
  Administered 2024-05-15: 4 mg via INTRAVENOUS
  Filled 2024-05-15: qty 2

## 2024-05-15 NOTE — ED Notes (Signed)
 Pt's sats decreased to 86% on room air while sleeping. Pt placed on 2L of O2 via nasal cannula

## 2024-05-15 NOTE — Consult Note (Addendum)
 NEUROLOGY CONSULT NOTE   Date of service: May 15, 2024 Patient Name: Michelle Conley MRN:  [id number] DOB:  October 06, 1971 Chief Complaint: blurred vision left eye, concern for BRAO/CRAO Requesting Provider: Jhonny Calvin NOVAK, MD  History of Present Illness  Michelle Conley is a 53 y.o. female with hx of prior stroke at the age of 54 with residual multiple cranial neuropathies migraines, hypertension, PTSD, anxiety, depression, COPD, current tobacco smoker, GERD, possible prior history of meningitis at the same time that she had the stroke in her 30s, chronic pain, who presents to the emergency department for evaluation of blurred vision and headache. Reports her symptoms for this admission as left eye vision changes along with headache which started initially behind the right eye but then moved on to involve her left eye.  She had blurred vision in the left eye.  She saw her ophthalmologist who noted Hollenhorst plaque on fundus exam in the left eye concerning for CRAO and sent her to the ED for admission and further evaluation.  Ophthalmology also discovered new 3rd and 6th cranial nerve palsies on the right, and she reports the left eye ptosis has been progressively getting worse for the past 2 months.  The right eye movement abnormality, and subtle right facial weakness is old from the prior stroke  LKW: Multiple days ago-unclear last known well Modified rankin score: 2-Slight disability-UNABLE to perform all activities but does not need assistance IV Thrombolysis: No-outside the window EVT: No-outside the window  NIHSS components Score: Comment  1a Level of Conscious 0[x]  1[]  2[]  3[]      1b LOC Questions 0[x]  1[]  2[]       1c LOC Commands 0[x]  1[]  2[]       2 Best Gaze 0[]  1[]  2[x]       3 Visual 0[x]  1[]  2[]  3[]      4 Facial Palsy 0[]  1[x]  2[]  3[]      5a Motor Arm - left 0[x]  1[]  2[]  3[]  4[]  UN[]    5b Motor Arm - Right 0[x]  1[]  2[]  3[]  4[]  UN[]    6a Motor Leg - Left 0[x]  1[]  2[]  3[]  4[]   UN[]    6b Motor Leg - Right 0[x]  1[]  2[]  3[]  4[]  UN[]    7 Limb Ataxia 0[x]  1[]  2[]  UN[]      8 Sensory 0[x]  1[]  2[]  UN[]      9 Best Language 0[x]  1[]  2[]  3[]      10 Dysarthria 0[x]  1[]  2[]  UN[]      11 Extinct. and Inattention 0[x]  1[]  2[]       TOTAL: 3      ROS  Comprehensive ROS performed and pertinent positives documented in HPI   Past History   Past Medical History:  Diagnosis Date   Abnormal chest xray    Reports that she has spots on her lungs   Anemia    Anxiety    Aortic valve disorder    Aortic valve insufficiency    Asthma    Cervical dysplasia    Complication of anesthesia    reports that she comes out of it slowly   COPD (chronic obstructive pulmonary disease) (HCC)    Cranial nerve palsy    Depression    GERD (gastroesophageal reflux disease)    Goiter    History of kidney stones    History of meningitis    History of viral encephalitis    Hypertension    Hypothyroidism    Migraine headache    Post traumatic stress disorder (PTSD)    Scarlet  fever    Shortness of breath dyspnea    Stroke [facility])    Syncope and collapse     Past Surgical History:  Procedure Laterality Date   ABDOMINAL SURGERY     CESAREAN SECTION  11/06/1995   CHOLECYSTECTOMY  11/05/2013   COLONOSCOPY WITH PROPOFOL  N/A 07/10/2023   Procedure: COLONOSCOPY WITH PROPOFOL ;  Surgeon: Unk Corinn Skiff, MD;  Location: [facility] ENDOSCOPY;  Service: Gastroenterology;  Laterality: N/A;   LINGUAL FRENECTOMY N/A 06/20/2015   Procedure: UPPER LABIAL L FRENECTOMY;  Surgeon: Lonni Sax, DDS;  Location: [facility] OR;  Service: Oral Surgery;  Laterality: N/A;   MULTIPLE EXTRACTIONS WITH ALVEOLOPLASTY N/A 06/20/2015   Procedure: EXTRACTION  OF TEETH NUMBERS 2-15, 19-30,ALVEOLOPLASTY OF MAXILLA  AND MANDIBLE;  Surgeon: Lonni Sax, DDS;  Location: [facility] OR;  Service: Oral Surgery;  Laterality: N/A;   NASAL SEPTUM SURGERY     POLYPECTOMY  07/10/2023   Procedure: POLYPECTOMY INTESTINAL;  Surgeon: Unk Corinn Skiff, MD;  Location: [facility] ENDOSCOPY;  Service: Gastroenterology;;    Family History: Family History  Problem Relation Age of Onset   Lung cancer Mother    Esophageal cancer Mother    Diabetes Maternal Grandmother    Stroke Maternal Grandmother     Social History  reports that she has been smoking cigarettes. She has a 30 pack-year smoking history. She has been exposed to tobacco smoke. She has never used smokeless tobacco. She reports current drug use. Drug: Marijuana. She reports that she does not drink alcohol .  Allergies  Allergen Reactions   Bee Pollen Anaphylaxis and Swelling   Paroxetine Hcl Shortness Of Breath    Other reaction(s): OTHER   Rofecoxib Other (See Comments) and Anaphylaxis    vioxx  Other reaction(s): Other (See Comments), Unknown  vioxx   Wasp Venom Anaphylaxis and Swelling   Penicillins Nausea And Vomiting, Rash and Other (See Comments)    Other reaction(s): HIVES  Other Reaction: Not Assessed   Tramadol Other (See Comments), Nausea Only and Rash    Other Reaction: Not Assessed  Other reaction(s): Other (See Comments)  Other Reaction: Not Assessed  Other Reaction: Not Assessed   Codeine     Other reaction(s): ITCHING   Esomeprazole Magnesium  Other (See Comments)    Headaches   Fluoxetine Other (See Comments)   Ketorolac  Other (See Comments)    Migraine  Other reaction(s): Other (See Comments), Unknown  Pt reports causes rebound migraines   Meloxicam Other (See Comments)   Toradol  [Ketorolac  Tromethamine ] Other (See Comments)    Pt reports causes rebound migraines   Trazodone     Other reaction(s): OTHER   Amitriptyline Nausea And Vomiting   Cephalexin Rash    Genital Itching   Cyclobenzaprine Other (See Comments) and Itching    Migraine  Other reaction(s): Unknown   Metronidazole Other (See Comments) and Nausea Only    Other Reaction: Not Assessed  Migraine   Other reaction(s): Other (See Comments)  Other Reaction: Not  Assessed  Other Reaction: Not Assessed  Other Reaction: Not Assessed    Medications   Current Facility-Administered Medications:    acetaminophen  (TYLENOL ) tablet 650 mg, 650 mg, Oral, Q6H PRN **OR** acetaminophen  (TYLENOL ) suppository 650 mg, 650 mg, Rectal, Q6H PRN, Mansy, Jan A, MD   ALPRAZolam  (XANAX ) tablet 2 mg, 2 mg, Oral, QID PRN, Mansy, Jan A, MD   atorvastatin  (LIPITOR) tablet 80 mg, 80 mg, Oral, Daily, Mansy, Jan A, MD, 80 mg at 05/15/24 0926   enoxaparin  (LOVENOX ) injection  40 mg, 40 mg, Subcutaneous, Q24H, Mansy, Jan A, MD, 40 mg at 05/14/24 2304   EPINEPHrine  (EPI-PEN) injection 0.3 mg, 0.3 mg, Intramuscular, Once PRN, Mansy, Jan A, MD   furosemide  (LASIX ) tablet 20 mg, 20 mg, Oral, Daily, Mansy, Jan A, MD, 20 mg at 05/15/24 9073   HYDROcodone -acetaminophen  (NORCO/VICODIN) 5-325 MG per tablet 1 tablet, 1 tablet, Oral, Q8H PRN, Mansy, Jan A, MD, 1 tablet at 05/15/24 9073   hydrOXYzine  (ATARAX ) tablet 25 mg, 25 mg, Oral, QHS, Mansy, Jan A, MD, 25 mg at 05/14/24 2259   icosapent  Ethyl (VASCEPA ) 1 g capsule 2 g, 2 g, Oral, BID, Mansy, Jan A, MD, 2 g at 05/15/24 9073   ipratropium-albuterol  (DUONEB) 0.5-2.5 (3) MG/3ML nebulizer solution 3 mL, 3 mL, Nebulization, Q6H PRN, Mansy, Jan A, MD   levothyroxine  (SYNTHROID ) tablet 150 mcg, 150 mcg, Oral, Q0600, Mansy, Jan A, MD, 150 mcg at 05/15/24 9372   magnesium  hydroxide (MILK OF MAGNESIA) suspension 30 mL, 30 mL, Oral, Daily PRN, Mansy, Jan A, MD   melatonin tablet 2.5 mg, 2.5 mg, Oral, QHS PRN, Mansy, Jan A, MD, 2.5 mg at 05/14/24 2302   methocarbamol  (ROBAXIN ) tablet 1,500 mg, 1,500 mg, Oral, TID, Mansy, Jan A, MD, 1,500 mg at 05/15/24 9072   metoCLOPramide  (REGLAN ) injection 10 mg, 10 mg, Intravenous, Q8H, Sreenath, Sudheer B, MD, 10 mg at 05/15/24 9043   montelukast  (SINGULAIR ) tablet 10 mg, 10 mg, Oral, QHS, Mansy, Jan A, MD, 10 mg at 05/14/24 2259   naloxone  (NARCAN ) nasal spray 4 mg/0.1 mL, 1 spray, Nasal, Once PRN, Mansy, Jan A,  MD   pantoprazole  (PROTONIX ) EC tablet 40 mg, 40 mg, Oral, Daily, Mansy, Jan A, MD, 40 mg at 05/15/24 9072   promethazine  (PHENERGAN ) 12.5 mg in sodium chloride  0.9 % 50 mL IVPB, 12.5 mg, Intravenous, Q4H PRN, Jhonny, Sudheer B, MD  Current Outpatient Medications:    acetaminophen  (TYLENOL ) 500 MG tablet, Take 500 mg by mouth every 8 (eight) hours as needed for headache, mild pain (pain score 1-3) or fever., Disp: , Rfl:    albuterol  (VENTOLIN  HFA) 108 (90 Base) MCG/ACT inhaler, Inhale 2 puffs into the lungs every 6 (six) hours as needed for wheezing or shortness of breath., Disp: 1 each, Rfl: 0   alprazolam  (XANAX ) 2 MG tablet, Take 2 mg by mouth 4 (four) times daily., Disp: , Rfl:    atorvastatin  (LIPITOR) 80 MG tablet, Take 1 tablet (80 mg total) by mouth daily., Disp: 90 tablet, Rfl: 3   DUPIXENT 300 MG/2ML SOAJ, Inject into the skin., Disp: , Rfl:    EPINEPHrine  0.3 mg/0.3 mL IJ SOAJ injection, Inject 0.3 mg into the muscle once as needed (allergic reaction)., Disp: , Rfl:    escitalopram  (LEXAPRO ) 20 MG tablet, Take 20 mg by mouth daily., Disp: , Rfl:    furosemide  (LASIX ) 20 MG tablet, Take 1 tablet (20 mg total) by mouth daily., Disp: 30 tablet, Rfl: 1   ibuprofen  (ADVIL ) 800 MG tablet, Take 1 tablet (800 mg total) by mouth every 8 (eight) hours as needed for mild pain (pain score 1-3) or moderate pain (pain score 4-6)., Disp: 30 tablet, Rfl: 1   icosapent  Ethyl (VASCEPA ) 1 g capsule, Take 4 capsules (4 g total) by mouth 2 (two) times daily. (Patient taking differently: Take 2 g by mouth 2 (two) times daily.), Disp: 120 capsule, Rfl: 11   ipratropium-albuterol  (DUONEB) 0.5-2.5 (3) MG/3ML SOLN, Inhale 3 mLs into the lungs every 6 (six) hours as  needed., Disp: , Rfl:    levothyroxine  (SYNTHROID ) 150 MCG tablet, Take 1 tablet (150 mcg total) by mouth daily., Disp: 90 tablet, Rfl: 3   methocarbamol  (ROBAXIN ) 750 MG tablet, Take 2 tablets (1,500 mg total) by mouth 3 (three) times daily., Disp:  180 tablet, Rfl: 3   nortriptyline  (PAMELOR ) 50 MG capsule, Take 50 mg by mouth at bedtime., Disp: , Rfl:    omeprazole  (PRILOSEC) 20 MG capsule, Take 1 capsule (20 mg total) by mouth daily. (Patient taking differently: Take 40 mg by mouth daily.), Disp: 90 capsule, Rfl: 0   promethazine  (PHENERGAN ) 25 MG tablet, Take 1 tablet (25 mg total) by mouth every 6 (six) hours as needed., Disp: 90 tablet, Rfl: 0   ARIPiprazole  (ABILIFY ) 5 MG tablet, Take by mouth. (Patient not taking: Reported on 05/14/2024), Disp: , Rfl:    buPROPion ER (WELLBUTRIN SR) 100 MG 12 hr tablet, Take 100 mg by mouth 2 (two) times daily. (Patient not taking: Reported on 04/30/2024), Disp: , Rfl:    clobetasol (TEMOVATE) 0.05 % external solution, Apply topically daily. (Patient not taking: Reported on 04/30/2024), Disp: , Rfl:    fluticasone (FLONASE) 50 MCG/ACT nasal spray, Place 1 spray into both nostrils daily. (Patient not taking: Reported on 05/14/2024), Disp: , Rfl:    Fluticasone-Umeclidin-Vilant (TRELEGY ELLIPTA ) 100-62.5-25 MCG/ACT AEPB, Inhale 1 Inhalation into the lungs daily. (Patient not taking: Reported on 05/14/2024), Disp: 1 each, Rfl: 11   HYDROcodone -acetaminophen  (NORCO/VICODIN) 5-325 MG tablet, Take 1 tablet by mouth every 8 (eight) hours as needed for moderate pain (pain score 4-6). (Patient not taking: Reported on 05/14/2024), Disp: 15 tablet, Rfl: 0   hydrOXYzine  (ATARAX ) 25 MG tablet, TAKE 1 TABLET BY MOUTH ONCE DAILY AT NIGHT AT BEDTIME (Patient not taking: Reported on 05/14/2024), Disp: , Rfl:    montelukast  (SINGULAIR ) 10 MG tablet, Take 1 tablet (10 mg total) by mouth at bedtime. (Patient not taking: Reported on 05/14/2024), Disp: 90 tablet, Rfl: 0   Na Sulfate-K Sulfate-Mg Sulfate concentrate 17.5-3.13-1.6 GM/177ML SOLN, Take by mouth. (Patient not taking: Reported on 05/14/2024), Disp: , Rfl:    naloxone  (NARCAN ) nasal spray 4 mg/0.1 mL, Call 911. Administer a single spray in one nostril. If no or minimal response  after 2 to 3 minutes, an additional dose may be given in the alternate nostril. (Patient not taking: Reported on 05/14/2024), Disp: , Rfl:    nicotine (NICODERM CQ - DOSED IN MG/24 HOURS) 21 mg/24hr patch, Place onto the skin. (Patient not taking: Reported on 04/30/2024), Disp: , Rfl:    simvastatin  (ZOCOR ) 20 MG tablet, Take 1 tablet (20 mg total) by mouth every evening. (Patient not taking: Reported on 05/14/2024), Disp: 90 tablet, Rfl: 0   Spacer/Aero-Holding Chambers (AEROCHAMBER MV) inhaler, Use as instructed, Disp: 1 each, Rfl: 2   tiotropium (SPIRIVA ) 18 MCG inhalation capsule, Place 1 capsule (18 mcg total) into inhaler and inhale daily. (Patient not taking: Reported on 05/14/2024), Disp: 30 capsule, Rfl: 1   Vitamin D , Ergocalciferol , (DRISDOL ) 1.25 MG (50000 UNIT) CAPS capsule, Take 1 capsule (50,000 Units total) by mouth every 7 (seven) days. (Patient not taking: Reported on 05/14/2024), Disp: 13 capsule, Rfl: 1  Vitals   Vitals:   05/15/24 0504 05/15/24 0600 05/15/24 0900 05/15/24 0925  BP: (!) 151/75 137/77 (!) 116/92   Pulse: 73 72 78   Resp: 15 17 13    Temp:    (!) 97.4 F (36.3 C)  TempSrc:    Oral  SpO2: 98% 95%  95%   Weight:      Height:        Body mass index is 32.25 kg/m.   Physical Exam   General: Awake alert in no distress HEENT: Normocephalic atraumatic Lungs: Clear Cardiovascular: Regular rhythm Neurological exam Awake alert oriented x 3 No evidence of aphasia or dysarthria Cranial nerve examination: Pupils equal round reactive to light-no clear APD, there is left eye ptosis, eye movement exam reveals multiple abnormalities: Right eye is able to abduct normally but has some restriction on adduction as well as on adduction, there is upward deviation.  Left eye can barely Adduct and does not abduct at all, vertical movement also minimal on the left eye.  She has a mild right lower facial droop, which she says is at baseline for many years.  Hearing is intact.   Tongue and palate midline. Motor examination with no drift Sensation intact to light touch Coordination examination reveals no gross dysmetria   Labs/Imaging/Neurodiagnostic studies   CBC:  Recent Labs  Lab 05-17-2024 1752 05/15/24 0509  WBC 10.9* 12.8*  NEUTROABS 6.3  --   HGB 14.4 14.1  HCT 42.2 41.5  MCV 90.0 90.6  PLT 275 292   Basic Metabolic Panel:  Lab Results  Component Value Date   NA 135 05/15/2024   K 4.6 05/15/2024   CO2 24 05/15/2024   GLUCOSE 100 (H) 05/15/2024   BUN 16 05/15/2024   CREATININE 0.98 05/15/2024   CALCIUM  9.0 05/15/2024   GFRNONAA >60 05/15/2024   GFRAA >60 01/04/2019   Lipid Panel:  Lab Results  Component Value Date   LDLCALC 84 01/30/2024   Alcohol  Level     Component Value Date/Time   ETH <15 05/15/2024 0509   INR  Lab Results  Component Value Date   INR 1.0 2024-05-17   APTT  Lab Results  Component Value Date   APTT 39 (H) 2024/05/17   CT Head without contrast(Personally reviewed): Unremarkable  MRI brain without contrast and MRA head without contrast: No acute intracranial abnormality, patent intracranial arterial vasculature.  Small focus of T2 hyperintense lesion within the right paramedian pons which is new since 04/06/2024 which probably is a an infarct that happened in the interim.  Does not look like an acute infarct.  May fit with the history of 2 months of eye movement abnormality worsening   ASSESSMENT   ARELIE KUZEL is a 53 y.o. female past medical history as above who is presenting for evaluation of multiple months of double vision and blurred vision with acute worsening of the left eye blurred vision and progressive worsening of left eye ptosis that has been going on for a few months. Her eye examination was concerning for left CRAO for which she was sent to the ER. On examination she almost has a One and a Half Syndrome like presentation likely due to the interim development of the pontine lesion seen on MRI  which is causing lesion in the PPRF and ipsilateral MLF.  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  Also left eye ptosis progressively worsening for a few months-evaluate for carotid dissection  RECOMMENDATIONS  Admit to hospitalist Frequent rechecks Telemetry Although she had MRI of the head, I would still like to get a CT angio head and neck because I want to evaluate her head and neck vasculature thoroughly completely. 2D echo A1c Lipid panel Aspirin  81+ Plavix  75-duration TBD on vessel imaging High intensity statin for an LDL goal of less than  70.  Her last LDL in March was 84. Therapy assessments Will need outpatient follow-up with neurology Will follow-plan discussed with Dr. Jhonny  ______________________________________________________________________    Signed, Eligio Lav, MD [facility]

## 2024-05-15 NOTE — Progress Notes (Signed)
 PROGRESS NOTE    Michelle Conley  FMW:[id number] DOB: 1970-11-22 DOA: 05/14/2024 PCP: Ziglar, Susan K, MD    Brief Narrative:   53 y.o. 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  She had nausea and vomiting last night.  She denies any chest pain or palpitations.  No cough or wheezing or dyspnea.  She continues to smoke 1 pack of cigarettes per day.  No dysuria, anuria or hematuria or flank pain.  The patient was seen today by ophthalmology Dr. Jaye who noticed Hollenhorst plaque upon fundus exam that is concerning about CRAO.  He also noticed that she was having new left 3rd and 6th cranial nerve palsies      Assessment & Plan:   Principal Problem:   Acute CVA (cerebrovascular accident) [facility]) Active Problems:   Hypokalemia   Anxiety and depression   Hypothyroidism   GERD without esophagitis   Dyslipidemia   Asthma, chronic  Possible central retinal artery occlusion Chronic pontine infarct new from June Several months of blurred vision with acute worsening on the left eye.  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  On examination she has concern for 1-1/2 syndrome like presentation due to the interval development of the pontine lesion on MRI. Plan: Admit inpatient Telemetry MRI brain with and without contrast MRI orbits 2D echocardiogram A1c Lipid panel Aspirin  and Plavix  DAPT PT and OT evaluations Neurology to follow  Intractable nausea and vomiting Chest wall pain This may be due to the location of the infarct Antiemetic regimen Pain regimen  GERD PPI  Asthma No  exacerbation Bronchodilators  Hyperlipidemia High disease statin  Hypothyroidism Synthroid   Anxiety Depression Xanax , Wellbutrin, Abilify   Obesity BMI 32 Complicates overall care and prognosis   DVT prophylaxis: SQ lovenox  Code Status: Full Family Communication: None Disposition Plan: Status is: Inpatient Remains inpatient appropriate because: Intractable nausea and vomiting.  Blurred vision   Level of care: Telemetry Medical  Consultants:  Neurology  Procedures:  None  Antimicrobials: None   Subjective: Seen and examined.  Sitting up on bed.  Appears fatigued  Objective: Vitals:   05/15/24 1030 05/15/24 1100 05/15/24 1130 05/15/24 1400  BP:  135/65  (!) 102/90  Pulse:  62  73  Resp:  12  13  Temp: 97.6 F (36.4 C)  97.6 F (36.4 C)   TempSrc:   Oral   SpO2:  94%  93%  Weight:      Height:        Intake/Output Summary (Last 24 hours) at 05/15/2024 1500 Last data filed at 05/15/2024 0630 Gross per 24 hour  Intake 50 ml  Output --  Net 50 ml   Filed Weights   05/14/24 1744  Weight: 70 kg    Examination:  General exam: Appears calm and comfortable  Respiratory system: Clear to auscultation. Respiratory effort normal. Cardiovascular system: S1-S2, RRR, no murmurs, no pedal edema Gastrointestinal system: Obese, soft, NT/ND, normal bowel sounds Central nervous system: Alert and oriented.  Mild right facial droop.  Hearing intact.  Tongue midline.  Abnormal external ocular movements Extremities: Symmetric 5 x 5 power. Skin: No rashes, lesions or ulcers Psychiatry: Judgement and insight appear normal. Mood &  affect appropriate.     Data Reviewed: I have personally reviewed following labs and imaging studies  CBC: Recent Labs  Lab 05/14/24 1752 05/15/24 0509  WBC 10.9* 12.8*  NEUTROABS 6.3  --   HGB 14.4 14.1  HCT 42.2 41.5  MCV 90.0 90.6  PLT 275 292   Basic Metabolic Panel: Recent Labs  Lab 05/14/24 1752 05/15/24 0509  NA 136  135  K 3.2* 4.6  CL 99 101  CO2 24 24  GLUCOSE 102* 100*  BUN 15 16  CREATININE 0.91 0.98  CALCIUM  9.3 9.0   GFR: Estimated Creatinine Clearance: 56.3 mL/min (by C-G formula based on SCr of 0.98 mg/dL). Liver Function Tests: Recent Labs  Lab 05/14/24 1752  AST 33  ALT 49*  ALKPHOS 104  BILITOT 0.6  PROT 8.3*  ALBUMIN 4.9   No results for input(s): LIPASE, AMYLASE in the last 168 hours. No results for input(s): AMMONIA in the last 168 hours. Coagulation Profile: Recent Labs  Lab 05/14/24 1752  INR 1.0   Cardiac Enzymes: No results for input(s): CKTOTAL, CKMB, CKMBINDEX, TROPONINI in the last 168 hours. BNP (last 3 results) No results for input(s): PROBNP in the last 8760 hours. HbA1C: No results for input(s): HGBA1C in the last 72 hours. CBG: No results for input(s): GLUCAP in the last 168 hours. Lipid Profile: No results for input(s): CHOL, HDL, LDLCALC, TRIG, CHOLHDL, LDLDIRECT in the last 72 hours. Thyroid  Function Tests: No results for input(s): TSH, T4TOTAL, FREET4, T3FREE, THYROIDAB in the last 72 hours. Anemia Panel: No results for input(s): VITAMINB12, FOLATE, FERRITIN, TIBC, IRON, RETICCTPCT in the last 72 hours. Sepsis Labs: No results for input(s): PROCALCITON, LATICACIDVEN in the last 168 hours.  No results found for this or any previous visit (from the past 240 hours).       Radiology Studies: MR ORBITS W WO CONTRAST Result Date: 05/15/2024 CLINICAL DATA:  Diplopia, central retinal artery occlusion. EXAM: MRI HEAD WITH CONTRAST MRI ORBITS WITHOUT AND WITH CONTRAST TECHNIQUE: Multiplanar, multiecho pulse sequences of the brain and surrounding structures were obtained with intravenous contrast. Multiplanar, multiecho pulse sequences of the orbits and surrounding structures were obtained including fat saturation techniques, before and after intravenous contrast administration. CONTRAST:  7mL  GADAVIST  GADOBUTROL  1 MMOL/ML IV SOLN COMPARISON:  MRI head and MRA head 05/14/2024. MRI head and MR venogram 04/06/2024. FINDINGS: MRI HEAD FINDINGS Brain: No abnormal intracranial enhancement. No edema or midline shift. Normal appearance of the white matter. Basilar cisterns are patent. Posterior fossa is unremarkable. No extra-axial fluid collections. Ventricles: Normal size and configuration of the ventricles. Vascular: Skull base flow voids are visualized. Skull and upper cervical spine: No focal abnormality. Other: Bilateral mastoid effusions. Similar appearance of adenoid hypertrophy. MRI ORBITS FINDINGS Orbits: No traumatic or inflammatory finding. Globes, optic nerves, orbital fat, extraocular muscles, vascular structures, and lacrimal glands are normal. Visualized sinuses: Mucous retention cyst in the right maxillary sinus. Similar appearance of cystic focus within the nasal septum. Soft tissues: The soft tissues are unremarkable. IMPRESSION: No abnormal intracranial enhancement. Unremarkable MRI of the orbits. Electronically Signed   By: Donnice Mania M.D.   On: 05/15/2024 12:37   MR BRAIN W CONTRAST Result Date: 05/15/2024 CLINICAL DATA:  Diplopia, central retinal artery occlusion. EXAM: MRI HEAD WITH CONTRAST MRI ORBITS WITHOUT AND WITH CONTRAST TECHNIQUE: Multiplanar, multiecho pulse sequences of the brain and surrounding structures were obtained with intravenous contrast. Multiplanar, multiecho pulse sequences of the orbits and surrounding structures were obtained including  fat saturation techniques, before and after intravenous contrast administration. CONTRAST:  7mL GADAVIST  GADOBUTROL  1 MMOL/ML IV SOLN COMPARISON:  MRI head and MRA head 05/14/2024. MRI head and MR venogram 04/06/2024. FINDINGS: MRI HEAD FINDINGS Brain: No abnormal intracranial enhancement. No edema or midline shift. Normal appearance of the white matter. Basilar cisterns are patent. Posterior fossa is unremarkable. No  extra-axial fluid collections. Ventricles: Normal size and configuration of the ventricles. Vascular: Skull base flow voids are visualized. Skull and upper cervical spine: No focal abnormality. Other: Bilateral mastoid effusions. Similar appearance of adenoid hypertrophy. MRI ORBITS FINDINGS Orbits: No traumatic or inflammatory finding. Globes, optic nerves, orbital fat, extraocular muscles, vascular structures, and lacrimal glands are normal. Visualized sinuses: Mucous retention cyst in the right maxillary sinus. Similar appearance of cystic focus within the nasal septum. Soft tissues: The soft tissues are unremarkable. IMPRESSION: No abnormal intracranial enhancement. Unremarkable MRI of the orbits. Electronically Signed   By: Donnice Mania M.D.   On: 05/15/2024 12:37   MR ANGIO HEAD WO CONTRAST Result Date: 05/14/2024 CLINICAL DATA:  Central retinal artery occlusion, cranial nerve 3 and 6 palsy, ptosis. Evaluation for stroke and arterial occlusion. EXAM: MRI HEAD WITHOUT CONTRAST MRA HEAD WITHOUT CONTRAST TECHNIQUE: Multiplanar, multi-echo pulse sequences of the brain and surrounding structures were acquired without intravenous contrast. Angiographic images of the Circle of Willis were acquired using MRA technique without intravenous contrast. COMPARISON:  Earlier same day CT head. MR head and MR venogram 04/06/2024. FINDINGS: MRI HEAD FINDINGS Brain: No acute infarct. No evidence of intracranial hemorrhage. Focal T2/FLAIR hyperintensity within the right paramedian pons is new from the prior MRI. White matter is otherwise unremarkable. No edema, mass effect, or midline shift. Posterior fossa is unremarkable. Normal appearance of midline structures. The basilar cisterns are patent. No extra-axial fluid collections. Ventricles: Normal size and configuration of the ventricles. Vascular: Skull base flow voids are visualized. Skull and upper cervical spine: No focal abnormality. Sinuses/Orbits: Orbits are  symmetric. Mucous retention cyst in the posterior right maxillary sinus. Small cystic focus within the anterior nasal septum. Other: Bilateral mastoid effusions. Similar hypertrophy of the adenoids. MRA HEAD FINDINGS Anterior circulation: The intracranial internal carotid arteries are patent bilaterally without significant irregularity or stenosis. The right M1 segment and MCA bifurcation are patent. Proximal M2 branches of the right MCA are patent. Distal right MCA branches are patent. The left M1 segment and left MCA bifurcation are patent. Proximal left M2 branches are patent. Distal left MCA branches are patent. The A1 and A2 segments are patent bilaterally. Distal ACA branches are patent. Posterior circulation: The visualized intracranial vertebral arteries are patent. Left vertebral artery is dominant. The basilar artery is patent. The bilateral posterior cerebral arteries are patent. Posterior communicating artery visualized on the right. Superior cerebellar arteries are patent bilaterally. AICA visualized on the right. PICA partially visualized on the left. Anatomic variants: No significant variant. IMPRESSION: No acute intracranial abnormality. Patent intracranial arterial vasculature. Small focus of T2 hyperintensity within the right paramedian pons is new since 04/06/2024 and may reflect a focus of remote infarct versus chronic microvascular ischemic change. Electronically Signed   By: Donnice Mania M.D.   On: 05/14/2024 22:26   MR BRAIN WO CONTRAST Result Date: 05/14/2024 CLINICAL DATA:  Central retinal artery occlusion, cranial nerve 3 and 6 palsy, ptosis. Evaluation for stroke and arterial occlusion. EXAM: MRI HEAD WITHOUT CONTRAST MRA HEAD WITHOUT CONTRAST TECHNIQUE: Multiplanar, multi-echo pulse sequences of the brain and surrounding structures were acquired without intravenous contrast.  Angiographic images of the Circle of Willis were acquired using MRA technique without intravenous contrast.  COMPARISON:  Earlier same day CT head. MR head and MR venogram 04/06/2024. FINDINGS: MRI HEAD FINDINGS Brain: No acute infarct. No evidence of intracranial hemorrhage. Focal T2/FLAIR hyperintensity within the right paramedian pons is new from the prior MRI. White matter is otherwise unremarkable. No edema, mass effect, or midline shift. Posterior fossa is unremarkable. Normal appearance of midline structures. The basilar cisterns are patent. No extra-axial fluid collections. Ventricles: Normal size and configuration of the ventricles. Vascular: Skull base flow voids are visualized. Skull and upper cervical spine: No focal abnormality. Sinuses/Orbits: Orbits are symmetric. Mucous retention cyst in the posterior right maxillary sinus. Small cystic focus within the anterior nasal septum. Other: Bilateral mastoid effusions. Similar hypertrophy of the adenoids. MRA HEAD FINDINGS Anterior circulation: The intracranial internal carotid arteries are patent bilaterally without significant irregularity or stenosis. The right M1 segment and MCA bifurcation are patent. Proximal M2 branches of the right MCA are patent. Distal right MCA branches are patent. The left M1 segment and left MCA bifurcation are patent. Proximal left M2 branches are patent. Distal left MCA branches are patent. The A1 and A2 segments are patent bilaterally. Distal ACA branches are patent. Posterior circulation: The visualized intracranial vertebral arteries are patent. Left vertebral artery is dominant. The basilar artery is patent. The bilateral posterior cerebral arteries are patent. Posterior communicating artery visualized on the right. Superior cerebellar arteries are patent bilaterally. AICA visualized on the right. PICA partially visualized on the left. Anatomic variants: No significant variant. IMPRESSION: No acute intracranial abnormality. Patent intracranial arterial vasculature. Small focus of T2 hyperintensity within the right paramedian pons  is new since 04/06/2024 and may reflect a focus of remote infarct versus chronic microvascular ischemic change. Electronically Signed   By: Donnice Mania M.D.   On: 05/14/2024 22:26   CT HEAD WO CONTRAST Result Date: 05/14/2024 CLINICAL DATA:  Headache, neuro deficit, vision difficulty for the last month. EXAM: CT HEAD WITHOUT CONTRAST TECHNIQUE: Contiguous axial images were obtained from the base of the skull through the vertex without intravenous contrast. RADIATION DOSE REDUCTION: This exam was performed according to the departmental dose-optimization program which includes automated exposure control, adjustment of the mA and/or kV according to patient size and/or use of iterative reconstruction technique. COMPARISON:  CT head and MRI head 04/06/2024. FINDINGS: Brain: No acute intracranial hemorrhage. No CT evidence of acute infarct. No edema, mass effect, or midline shift. The basilar cisterns are patent. Ventricles: The ventricles are normal. Vascular: No hyperdense vessel or unexpected calcification. Skull: No acute or aggressive finding. Orbits: Orbits are symmetric. Sinuses: Mild mucosal thickening in the partially visualized right maxillary sinus. Other: Small bilateral mastoid effusions similar to prior. IMPRESSION: No CT evidence of acute intracranial abnormality. Electronically Signed   By: Donnice Mania M.D.   On: 05/14/2024 18:39        Scheduled Meds:  aspirin  EC  81 mg Oral Daily   atorvastatin   80 mg Oral Daily   clopidogrel   75 mg Oral Daily   enoxaparin  (LOVENOX ) injection  40 mg Subcutaneous Q24H   furosemide   20 mg Oral Daily   hydrOXYzine   25 mg Oral QHS   icosapent  Ethyl  2 g Oral BID   levothyroxine   150 mcg Oral Q0600   methocarbamol   1,500 mg Oral TID   metoCLOPramide  (REGLAN ) injection  10 mg Intravenous Q8H   montelukast   10 mg Oral QHS   pantoprazole   40 mg Oral Daily   Continuous Infusions:  promethazine  (PHENERGAN ) injection (IM or IVPB)       LOS: 0 days     Calvin KATHEE Robson, MD [facility]   If 7PM-7AM, please contact night-coverage  05/15/2024, 3:00 PM

## 2024-05-15 NOTE — ED Notes (Signed)
 Pt complaining of 10/10 pain behind left eye with nausea. Dr Lawence, admitting provider made aware and Morphine  and zofran  ordered. Pt made aware

## 2024-05-15 NOTE — ED Notes (Signed)
 Pt is actively vomiting. Will make provider aware.

## 2024-05-15 NOTE — Assessment & Plan Note (Signed)
 Will replace potassium

## 2024-05-16 DIAGNOSIS — G629 Polyneuropathy, unspecified: Secondary | ICD-10-CM

## 2024-05-16 DIAGNOSIS — I69398 Other sequelae of cerebral infarction: Secondary | ICD-10-CM

## 2024-05-16 DIAGNOSIS — I639 Cerebral infarction, unspecified: Secondary | ICD-10-CM | POA: Diagnosis not present

## 2024-05-16 DIAGNOSIS — H3412 Central retinal artery occlusion, left eye: Secondary | ICD-10-CM | POA: Diagnosis not present

## 2024-05-16 DIAGNOSIS — H02402 Unspecified ptosis of left eyelid: Secondary | ICD-10-CM | POA: Diagnosis not present

## 2024-05-16 LAB — LIPID PANEL
Cholesterol: 196 mg/dL (ref 0–200)
HDL: 53 mg/dL (ref >40)
LDL Cholesterol: 103 mg/dL — ABNORMAL HIGH (ref 0–99)
Total CHOL/HDL Ratio: 3.7 ratio
Triglycerides: 202 mg/dL — ABNORMAL HIGH (ref <150)
VLDL: 40 mg/dL (ref 0–40)

## 2024-05-16 NOTE — Plan of Care (Signed)

## 2024-05-16 NOTE — Progress Notes (Signed)
 NEUROLOGY CONSULT FOLLOW UP NOTE   Date of service: May 16, 2024 Patient Name: Michelle Conley MRN:  [id number] DOB:  [birth year]  Interval Hx/subjective  Patient seen and examined.  No new complaints. Vitals   Vitals:   05/15/24 2008 05/15/24 2357 05/16/24 0408 05/16/24 0808  BP: (!) 158/80 (!) 143/97 (!) 160/73 (!) 108/44  Pulse: 76 80 75 62  Resp: 18 18 18    Temp: 97.9 F (36.6 C) 97.8 F (36.6 C) 98 F (36.7 C) 97.8 F (36.6 C)  TempSrc:    Oral  SpO2: 96% 95% 91% 95%  Weight:      Height:         Body mass index is 29.67 kg/m.  Physical Exam   General: Awake alert in no distress HEENT: Normocephalic atraumatic Lungs: Clear Cardiovascular: Regular rhythm Neurological exam Awake alert oriented x 3 No evidence of aphasia or dysarthria Cranial nerve examination: Pupils equal round reactive to light-no clear APD, there is left eye ptosis, eye movement exam reveals multiple abnormalities: Right eye is able to abduct normally but has some restriction on adduction as well as on adduction, there is upward deviation.  Left eye can barely Adduct and does not abduct at all, vertical movement also minimal on the left eye.  She has a mild right lower facial droop, which she says is at baseline for many years.  Hearing is intact.  Tongue and palate midline. Motor examination with no drift Sensation intact to light touch Coordination examination reveals no gross dysmetria  Medications  Current Facility-Administered Medications:    acetaminophen  (TYLENOL ) tablet 650 mg, 650 mg, Oral, Q6H PRN, 650 mg at 05/15/24 1816 **OR** acetaminophen  (TYLENOL ) suppository 650 mg, 650 mg, Rectal, Q6H PRN, Mansy, Jan A, MD   ALPRAZolam  (XANAX ) tablet 2 mg, 2 mg, Oral, QID PRN, Mansy, Jan A, MD, 2 mg at 05/16/24 9161   aspirin  EC tablet 81 mg, 81 mg, Oral, Daily, Cathryne Mancebo, MD, 81 mg at 05/16/24 0830   atorvastatin  (LIPITOR) tablet 80 mg, 80 mg, Oral, Daily, Mansy, Jan A, MD, 80 mg at  05/16/24 0831   clopidogrel  (PLAVIX ) tablet 75 mg, 75 mg, Oral, Daily, Masai Kidd, MD, 75 mg at 05/16/24 9167   enoxaparin  (LOVENOX ) injection 40 mg, 40 mg, Subcutaneous, Q24H, Mansy, Jan A, MD, 40 mg at 05/15/24 2151   EPINEPHrine  (EPI-PEN) injection 0.3 mg, 0.3 mg, Intramuscular, Once PRN, Mansy, Jan A, MD   furosemide  (LASIX ) tablet 20 mg, 20 mg, Oral, Daily, Mansy, Jan A, MD, 20 mg at 05/16/24 9167   hydrOXYzine  (ATARAX ) tablet 25 mg, 25 mg, Oral, QHS, Mansy, Jan A, MD, 25 mg at 05/14/24 2259   icosapent  Ethyl (VASCEPA ) 1 g capsule 2 g, 2 g, Oral, BID, Mansy, Jan A, MD, 2 g at 05/16/24 9167   ipratropium-albuterol  (DUONEB) 0.5-2.5 (3) MG/3ML nebulizer solution 3 mL, 3 mL, Nebulization, Q6H PRN, Mansy, Jan A, MD   levothyroxine  (SYNTHROID ) tablet 150 mcg, 150 mcg, Oral, Q0600, Mansy, Jan A, MD, 150 mcg at 05/16/24 9491   magnesium  hydroxide (MILK OF MAGNESIA) suspension 30 mL, 30 mL, Oral, Daily PRN, Mansy, Jan A, MD   melatonin tablet 2.5 mg, 2.5 mg, Oral, QHS PRN, Mansy, Jan A, MD, 2.5 mg at 05/14/24 2302   methocarbamol  (ROBAXIN ) tablet 1,500 mg, 1,500 mg, Oral, TID, Mansy, Jan A, MD, 1,500 mg at 05/16/24 9167   metoCLOPramide  (REGLAN ) injection 10 mg, 10 mg, Intravenous, Q8H, Sreenath, Sudheer B, MD, 10 mg at 05/16/24 [phone]  montelukast  (SINGULAIR ) tablet 10 mg, 10 mg, Oral, QHS, Mansy, Jan A, MD, 10 mg at 05/14/24 2259   morphine  (PF) 2 MG/ML injection 2 mg, 2 mg, Intravenous, Q3H PRN, Jhonny, Sudheer B, MD   naloxone  (NARCAN ) nasal spray 4 mg/0.1 mL, 1 spray, Nasal, Once PRN, Mansy, Madison LABOR, MD   oxyCODONE  (Oxy IR/ROXICODONE ) immediate release tablet 5-10 mg, 5-10 mg, Oral, Q4H PRN, Jhonny, Sudheer B, MD, 10 mg at 05/16/24 9167   pantoprazole  (PROTONIX ) EC tablet 40 mg, 40 mg, Oral, Daily, Mansy, Jan A, MD, 40 mg at 05/16/24 9167   promethazine  (PHENERGAN ) 25 mg in sodium chloride  0.9 % 50 mL IVPB, 25 mg, Intravenous, Q4H PRN, Jhonny, Sudheer B, MD, Last Rate: 150 mL/hr at 05/16/24  1153, 25 mg at 05/16/24 1153  Labs and Diagnostic Imaging   CBC:  Recent Labs  Lab 05/14/24 1752 05/15/24 0509  WBC 10.9* 12.8*  NEUTROABS 6.3  --   HGB 14.4 14.1  HCT 42.2 41.5  MCV 90.0 90.6  PLT 275 292    Basic Metabolic Panel:  Lab Results  Component Value Date   NA 135 05/15/2024   K 4.6 05/15/2024   CO2 24 05/15/2024   GLUCOSE 100 (H) 05/15/2024   BUN 16 05/15/2024   CREATININE 0.98 05/15/2024   CALCIUM  9.0 05/15/2024   GFRNONAA >60 05/15/2024   GFRAA >60 01/04/2019   Lipid Panel:  Lab Results  Component Value Date   LDLCALC 103 (H) 05/16/2024   HgbA1c: No results found for: HGBA1C  Alcohol  Level     Component Value Date/Time   ETH <15 05/15/2024 0509   INR  Lab Results  Component Value Date   INR 1.0 05/14/2024   APTT  Lab Results  Component Value Date   APTT 39 (H) 05/14/2024   Imaging personally reviewed CT angiography of head and neck-normal MR brain without contrast, and following that MR brain with contrast-no acute abnormality.  Small focus of T2 hyperintensity within the right paramedian pons new since 04/06/2024-likely remote infarct.  No abnormal enhancement MRA head-patent intracranial vasculature MR orbits with and without contrast-unremarkable MRI of the orbits.  2D echocardiogram pending at this time  Assessment   TITYANA PAGAN is a 53 y.o. female past history of stroke at the age of 28 with residual multiple cranial neuropathy, migraines, hypertension, PTSD, anxiety, depression, COPD, current tobacco user, GERD, possible history of meningitis at the time she had the strokes, chronic pain presented for evaluation of blurred vision and headache.  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   Impression CRAO-left eye Chronic pontine infarct but new from June 2025. Left eye ptosis with progressive worsening ongoing for few months-no carotid abnormalities on CTA   Recommendations  Telemetry Frequent rechecks Aspirin  and Plavix  for 3 weeks followed by aspirin  only High intensity statin for goal LDL less than 70. A1c pending-goal less than 7 LDL 107.  Atorvastatin  80 now  1 daily 2D echo pending Will follow the above studies with you.  Plan relayed to Dr. Jhonny ______________________________________________________________________   Signed, Eligio Lav, MD [facility]

## 2024-05-16 NOTE — Progress Notes (Signed)
 PROGRESS NOTE    Michelle Conley  FMW:[id number] DOB: October 12, 1971 DOA: 05/14/2024 PCP: Ziglar, Susan K, MD    Brief Narrative:    53 y.o. 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  She had nausea and vomiting last night.  She denies any chest pain or palpitations.  No cough or wheezing or dyspnea.  She continues to smoke 1 pack of cigarettes per day.  No dysuria, anuria or hematuria or flank pain.  The patient was seen today by ophthalmology Dr. Jaye who noticed Hollenhorst plaque upon fundus exam that is concerning about CRAO.  He also noticed that she was having new left 3rd and 6th cranial nerve palsies      Assessment & Plan:   Principal Problem:   Acute CVA (cerebrovascular accident) (HCC) Active Problems:   Hypokalemia   Anxiety and depression   Hypothyroidism   GERD without esophagitis   Dyslipidemia   Asthma, chronic   Central retinal artery occlusion  Left CRAO Chronic pontine infarct new from June Several months of blurred vision with acute worsening on the left eye.  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  On examination she has concern for 1-1/2 syndrome like presentation due to the interval development of the pontine lesion on MRI.  Extensive MRI and CT imaging survey of head and neck overall reassuring.  Can Plan: Telemetry Follow-up 2D echocardiogram High intensity statin DAPT aspirin  and Plavix  x 3 weeks followed by aspirin  monotherapy  Intractable nausea and vomiting Chest wall pain This may be due to the location of the infarct Antiemetic regimen Pain regimen Appears to be  improving  GERD Continue PPI  Asthma No exacerbation Continue as needed bronchodilators  Hyperlipidemia Continue high disease statin  Hypothyroidism Continue Synthroid   Anxiety Depression Continue Xanax , Wellbutrin, Abilify   Obesity BMI 32 Complicates overall care and prognosis   DVT prophylaxis: SQ lovenox  Code Status: Full Family Communication: None Disposition Plan: Status is: Inpatient Remains inpatient appropriate because: Intractable nausea and vomiting.  Blurred vision.  Poor tolerance to p.o.   Level of care: Telemetry Medical  Consultants:  Neurology  Procedures:  None  Antimicrobials: None   Subjective: Examined.  Sitting up in bed.  Attempting to eat more.  Objective: Vitals:   05/15/24 2357 05/16/24 0408 05/16/24 0808 05/16/24 1208  BP: (!) 143/97 (!) 160/73 (!) 108/44 116/80  Pulse: 80 75 62 74  Resp: 18 18    Temp: 97.8 F (36.6 C) 98 F (36.7 C) 97.8 F (36.6 C) 97.9 F (36.6 C)  TempSrc:   Oral Oral  SpO2: 95% 91% 95% 94%  Weight:      Height:        Intake/Output Summary (Last 24 hours) at 05/16/2024 1259 Last data filed at 05/16/2024 1220 Gross per 24 hour  Intake 340 ml  Output 200 ml  Net 140 ml   Filed Weights   05/14/24 1744 05/15/24 1727  Weight: 70 kg 64.4 kg    Examination:  General exam: NAD.  Appears fatigued Respiratory system: Lung clear.  Normal work of breathing.  Room air Cardiovascular system: S1-S2, RRR, no murmurs, no pedal edema Gastrointestinal system: Obese, soft, NT/ND, normal bowel sounds  Central nervous system: Alert and oriented.  Mild right facial droop.  Hearing intact.  Tongue midline.  Abnormal external ocular movements Extremities: Symmetric 5 x 5 power. Skin: No rashes, lesions or ulcers Psychiatry: Judgement and insight appear normal. Mood & affect appropriate.     Data Reviewed: I have personally reviewed following labs and imaging studies  CBC: Recent Labs  Lab 05/14/24 1752  05/15/24 0509  WBC 10.9* 12.8*  NEUTROABS 6.3  --   HGB 14.4 14.1  HCT 42.2 41.5  MCV 90.0 90.6  PLT 275 292   Basic Metabolic Panel: Recent Labs  Lab 05/14/24 1752 05/15/24 0509  NA 136 135  K 3.2* 4.6  CL 99 101  CO2 24 24  GLUCOSE 102* 100*  BUN 15 16  CREATININE 0.91 0.98  CALCIUM  9.3 9.0   GFR: Estimated Creatinine Clearance: 53.9 mL/min (by C-G formula based on SCr of 0.98 mg/dL). Liver Function Tests: Recent Labs  Lab 05/14/24 1752  AST 33  ALT 49*  ALKPHOS 104  BILITOT 0.6  PROT 8.3*  ALBUMIN 4.9   No results for input(s): LIPASE, AMYLASE in the last 168 hours. No results for input(s): AMMONIA in the last 168 hours. Coagulation Profile: Recent Labs  Lab 05/14/24 1752  INR 1.0   Cardiac Enzymes: No results for input(s): CKTOTAL, CKMB, CKMBINDEX, TROPONINI in the last 168 hours. BNP (last 3 results) No results for input(s): PROBNP in the last 8760 hours. HbA1C: No results for input(s): HGBA1C in the last 72 hours. CBG: No results for input(s): GLUCAP in the last 168 hours. Lipid Profile: Recent Labs    05/16/24 1041  CHOL 196  HDL 53  LDLCALC 103*  TRIG 202*  CHOLHDL 3.7   Thyroid  Function Tests: No results for input(s): TSH, T4TOTAL, FREET4, T3FREE, THYROIDAB in the last 72 hours. Anemia Panel: No results for input(s): VITAMINB12, FOLATE, FERRITIN, TIBC, IRON, RETICCTPCT in the last 72 hours. Sepsis Labs: No results for input(s): PROCALCITON, LATICACIDVEN in the last 168 hours.  No results found for this or any previous visit (from the past 240 hours).       Radiology Studies: CT ANGIO HEAD NECK W WO CM Result Date: 05/15/2024 CLINICAL DATA:  [place] EXAM: CT ANGIOGRAPHY HEAD AND NECK WITH AND WITHOUT CONTRAST TECHNIQUE: Multidetector CT imaging of the head and neck was performed using the standard protocol during bolus administration of intravenous contrast. Multiplanar CT image  reconstructions and MIPs were obtained to evaluate the vascular anatomy. Carotid stenosis measurements (when applicable) are obtained utilizing NASCET criteria, using the distal internal carotid diameter as the denominator. RADIATION DOSE REDUCTION: This exam was performed according to the departmental dose-optimization program which includes automated exposure control, adjustment of the mA and/or kV according to patient size and/or use of iterative reconstruction technique. CONTRAST:  75mL OMNIPAQUE  IOHEXOL  350 MG/ML SOLN COMPARISON:  MRI of the head and orbits dated May 15, 2024. FINDINGS: CT HEAD FINDINGS Brain: Normal brain. No evidence of hemorrhage, mass, cortical infarct or hydrocephalus. Vascular: Negative. Skull: Intact and unremarkable. Sinuses/Orbits: Polypoid lesion present posteriorly within the right maxillary sinus. The paranasal sinuses are otherwise clear. Normal orbits. Other: None. Review of the MIP images confirms the above findings CTA NECK FINDINGS Aortic arch: Normal. Right carotid system: The common carotid and internal carotid arteries are normal in caliber and unremarkable. Left carotid system: The common carotid and internal carotid arteries are normal in caliber and unremarkable. Vertebral arteries: The left vertebral artery is dominant. Both vertebral arteries  are normal in caliber throughout the respective courses. Skeleton: Negative. Other neck: Negative. Upper chest: Mild emphysema. Review of the MIP images confirms the above findings CTA HEAD FINDINGS Anterior circulation: The cranial in cavernous segments of the internal carotid arteries are normal in caliber bilaterally. The anterior and middle cerebral arteries and their proximal branches are normal in caliber. No evidence of aneurysm, flow-limiting stenosis or vascular malformation. There is fetal type origin of the right posterior cerebral artery. Posterior circulation: The vertebrobasilar system is unremarkable. The basilar  artery is widely patent. The posterior cerebral arteries and cerebellar arteries are also normal in caliber and widely patent. Venous sinuses: Patent. Anatomic variants: None. Review of the MIP images confirms the above findings IMPRESSION: 1. Normal CT angiogram of the head and neck. 2. Emphysema. Electronically Signed   By: Evalene Coho M.D.   On: 05/15/2024 19:23   MR ORBITS W WO CONTRAST Result Date: 05/15/2024 CLINICAL DATA:  [place], central retinal artery occlusion. EXAM: MRI HEAD WITH CONTRAST MRI ORBITS WITHOUT AND WITH CONTRAST TECHNIQUE: Multiplanar, multiecho pulse sequences of the brain and surrounding structures were obtained with intravenous contrast. Multiplanar, multiecho pulse sequences of the orbits and surrounding structures were obtained including fat saturation techniques, before and after intravenous contrast administration. CONTRAST:  7mL GADAVIST  GADOBUTROL  1 MMOL/ML IV SOLN COMPARISON:  MRI head and MRA head 05/14/2024. MRI head and MR venogram 04/06/2024. FINDINGS: MRI HEAD FINDINGS Brain: No abnormal intracranial enhancement. No edema or midline shift. Normal appearance of the white matter. Basilar cisterns are patent. Posterior fossa is unremarkable. No extra-axial fluid collections. Ventricles: Normal size and configuration of the ventricles. Vascular: Skull base flow voids are visualized. Skull and upper cervical spine: No focal abnormality. Other: Bilateral mastoid effusions. Similar appearance of adenoid hypertrophy. MRI ORBITS FINDINGS Orbits: No traumatic or inflammatory finding. Globes, optic nerves, orbital fat, extraocular muscles, vascular structures, and lacrimal glands are normal. Visualized sinuses: Mucous retention cyst in the right maxillary sinus. Similar appearance of cystic focus within the nasal septum. Soft tissues: The soft tissues are unremarkable. IMPRESSION: No abnormal intracranial enhancement. Unremarkable MRI of the orbits. Electronically Signed   By:  Donnice Mania M.D.   On: 05/15/2024 12:37   MR BRAIN W CONTRAST Result Date: 05/15/2024 CLINICAL DATA:  [place], central retinal artery occlusion. EXAM: MRI HEAD WITH CONTRAST MRI ORBITS WITHOUT AND WITH CONTRAST TECHNIQUE: Multiplanar, multiecho pulse sequences of the brain and surrounding structures were obtained with intravenous contrast. Multiplanar, multiecho pulse sequences of the orbits and surrounding structures were obtained including fat saturation techniques, before and after intravenous contrast administration. CONTRAST:  7mL GADAVIST  GADOBUTROL  1 MMOL/ML IV SOLN COMPARISON:  MRI head and MRA head 05/14/2024. MRI head and MR venogram 04/06/2024. FINDINGS: MRI HEAD FINDINGS Brain: No abnormal intracranial enhancement. No edema or midline shift. Normal appearance of the white matter. Basilar cisterns are patent. Posterior fossa is unremarkable. No extra-axial fluid collections. Ventricles: Normal size and configuration of the ventricles. Vascular: Skull base flow voids are visualized. Skull and upper cervical spine: No focal abnormality. Other: Bilateral mastoid effusions. Similar appearance of adenoid hypertrophy. MRI ORBITS FINDINGS Orbits: No traumatic or inflammatory finding. Globes, optic nerves, orbital fat, extraocular muscles, vascular structures, and lacrimal glands are normal. Visualized sinuses: Mucous retention cyst in the right maxillary sinus. Similar appearance of cystic focus within the nasal septum. Soft tissues: The soft tissues are unremarkable. IMPRESSION: No abnormal intracranial enhancement. Unremarkable MRI of the orbits. Electronically Signed   By: Donnice Mania HERO.D.  On: 05/15/2024 12:37   MR ANGIO HEAD WO CONTRAST Result Date: 05/14/2024 CLINICAL DATA:  Central retinal artery occlusion, cranial nerve 3 and 6 palsy, ptosis. Evaluation for stroke and arterial occlusion. EXAM: MRI HEAD WITHOUT CONTRAST MRA HEAD WITHOUT CONTRAST TECHNIQUE: Multiplanar, multi-echo pulse  sequences of the brain and surrounding structures were acquired without intravenous contrast. Angiographic images of the Circle of Willis were acquired using MRA technique without intravenous contrast. COMPARISON:  Earlier same day CT head. MR head and MR venogram 04/06/2024. FINDINGS: MRI HEAD FINDINGS Brain: No acute infarct. No evidence of intracranial hemorrhage. Focal T2/FLAIR hyperintensity within the right paramedian pons is new from the prior MRI. White matter is otherwise unremarkable. No edema, mass effect, or midline shift. Posterior fossa is unremarkable. Normal appearance of midline structures. The basilar cisterns are patent. No extra-axial fluid collections. Ventricles: Normal size and configuration of the ventricles. Vascular: Skull base flow voids are visualized. Skull and upper cervical spine: No focal abnormality. Sinuses/Orbits: Orbits are symmetric. Mucous retention cyst in the posterior right maxillary sinus. Small cystic focus within the anterior nasal septum. Other: Bilateral mastoid effusions. Similar hypertrophy of the adenoids. MRA HEAD FINDINGS Anterior circulation: The intracranial internal carotid arteries are patent bilaterally without significant irregularity or stenosis. The right M1 segment and MCA bifurcation are patent. Proximal M2 branches of the right MCA are patent. Distal right MCA branches are patent. The left M1 segment and left MCA bifurcation are patent. Proximal left M2 branches are patent. Distal left MCA branches are patent. The A1 and A2 segments are patent bilaterally. Distal ACA branches are patent. Posterior circulation: The visualized intracranial vertebral arteries are patent. Left vertebral artery is dominant. The basilar artery is patent. The bilateral posterior cerebral arteries are patent. Posterior communicating artery visualized on the right. Superior cerebellar arteries are patent bilaterally. AICA visualized on the right. PICA partially visualized on the  left. Anatomic variants: No significant variant. IMPRESSION: No acute intracranial abnormality. Patent intracranial arterial vasculature. Small focus of T2 hyperintensity within the right paramedian pons is new since 04/06/2024 and may reflect a focus of remote infarct versus chronic microvascular ischemic change. Electronically Signed   By: Donnice Mania M.D.   On: 05/14/2024 22:26   MR BRAIN WO CONTRAST Result Date: 05/14/2024 CLINICAL DATA:  Central retinal artery occlusion, cranial nerve 3 and 6 palsy, ptosis. Evaluation for stroke and arterial occlusion. EXAM: MRI HEAD WITHOUT CONTRAST MRA HEAD WITHOUT CONTRAST TECHNIQUE: Multiplanar, multi-echo pulse sequences of the brain and surrounding structures were acquired without intravenous contrast. Angiographic images of the Circle of Willis were acquired using MRA technique without intravenous contrast. COMPARISON:  Earlier same day CT head. MR head and MR venogram 04/06/2024. FINDINGS: MRI HEAD FINDINGS Brain: No acute infarct. No evidence of intracranial hemorrhage. Focal T2/FLAIR hyperintensity within the right paramedian pons is new from the prior MRI. White matter is otherwise unremarkable. No edema, mass effect, or midline shift. Posterior fossa is unremarkable. Normal appearance of midline structures. The basilar cisterns are patent. No extra-axial fluid collections. Ventricles: Normal size and configuration of the ventricles. Vascular: Skull base flow voids are visualized. Skull and upper cervical spine: No focal abnormality. Sinuses/Orbits: Orbits are symmetric. Mucous retention cyst in the posterior right maxillary sinus. Small cystic focus within the anterior nasal septum. Other: Bilateral mastoid effusions. Similar hypertrophy of the adenoids. MRA HEAD FINDINGS Anterior circulation: The intracranial internal carotid arteries are patent bilaterally without significant irregularity or stenosis. The right M1 segment and MCA bifurcation are patent.  Proximal M2 branches of the right MCA are patent. Distal right MCA branches are patent. The left M1 segment and left MCA bifurcation are patent. Proximal left M2 branches are patent. Distal left MCA branches are patent. The A1 and A2 segments are patent bilaterally. Distal ACA branches are patent. Posterior circulation: The visualized intracranial vertebral arteries are patent. Left vertebral artery is dominant. The basilar artery is patent. The bilateral posterior cerebral arteries are patent. Posterior communicating artery visualized on the right. Superior cerebellar arteries are patent bilaterally. AICA visualized on the right. PICA partially visualized on the left. Anatomic variants: No significant variant. IMPRESSION: No acute intracranial abnormality. Patent intracranial arterial vasculature. Small focus of T2 hyperintensity within the right paramedian pons is new since 04/06/2024 and may reflect a focus of remote infarct versus chronic microvascular ischemic change. Electronically Signed   By: Donnice Mania M.D.   On: 05/14/2024 22:26   CT HEAD WO CONTRAST Result Date: 05/14/2024 CLINICAL DATA:  Headache, neuro deficit, vision difficulty for the last month. EXAM: CT HEAD WITHOUT CONTRAST TECHNIQUE: Contiguous axial images were obtained from the base of the skull through the vertex without intravenous contrast. RADIATION DOSE REDUCTION: This exam was performed according to the departmental dose-optimization program which includes automated exposure control, adjustment of the mA and/or kV according to patient size and/or use of iterative reconstruction technique. COMPARISON:  CT head and MRI head 04/06/2024. FINDINGS: Brain: No acute intracranial hemorrhage. No CT evidence of acute infarct. No edema, mass effect, or midline shift. The basilar cisterns are patent. Ventricles: The ventricles are normal. Vascular: No hyperdense vessel or unexpected calcification. Skull: No acute or aggressive finding. Orbits:  Orbits are symmetric. Sinuses: Mild mucosal thickening in the partially visualized right maxillary sinus. Other: Small bilateral mastoid effusions similar to prior. IMPRESSION: No CT evidence of acute intracranial abnormality. Electronically Signed   By: Donnice Mania M.D.   On: 05/14/2024 18:39        Scheduled Meds:  aspirin  EC  81 mg Oral Daily   atorvastatin   80 mg Oral Daily   clopidogrel   75 mg Oral Daily   enoxaparin  (LOVENOX ) injection  40 mg Subcutaneous Q24H   furosemide   20 mg Oral Daily   hydrOXYzine   25 mg Oral QHS   icosapent  Ethyl  2 g Oral BID   levothyroxine   150 mcg Oral Q0600   methocarbamol   1,500 mg Oral TID   metoCLOPramide  (REGLAN ) injection  10 mg Intravenous Q8H   montelukast   10 mg Oral QHS   pantoprazole   40 mg Oral Daily   Continuous Infusions:  promethazine  (PHENERGAN ) injection (IM or IVPB) Stopped (05/16/24 1220)     LOS: 1 day    Calvin KATHEE Robson, MD [facility]   If 7PM-7AM, please contact night-coverage  05/16/2024, 12:59 PM

## 2024-05-16 NOTE — Plan of Care (Signed)
 Pt's first night on the unit.  Will assess and update plan of care as necessary.  Problem: Education: Goal: Knowledge of General Education information will improve Description: Including pain rating scale, medication(s)/side effects and non-pharmacologic comfort measures Outcome: Progressing   Problem: Health Behavior/Discharge Planning: Goal: Ability to manage health-related needs will improve Outcome: Progressing   Problem: Clinical Measurements: Goal: Ability to maintain clinical measurements within normal limits will improve Outcome: Progressing Goal: Will remain free from infection Outcome: Progressing Goal: Diagnostic test results will improve Outcome: Progressing Goal: Respiratory complications will improve Outcome: Progressing Goal: Cardiovascular complication will be avoided Outcome: Progressing   Problem: Activity: Goal: Risk for activity intolerance will decrease Outcome: Progressing   Problem: Nutrition: Goal: Adequate nutrition will be maintained Outcome: Progressing   Problem: Coping: Goal: Level of anxiety will decrease Outcome: Progressing   Problem: Elimination: Goal: Will not experience complications related to bowel motility Outcome: Progressing Goal: Will not experience complications related to urinary retention Outcome: Progressing   Problem: Pain Managment: Goal: General experience of comfort will improve and/or be controlled Outcome: Progressing   Problem: Safety: Goal: Ability to remain free from injury will improve Outcome: Progressing

## 2024-05-17 ENCOUNTER — Inpatient Hospital Stay
Admit: 2024-05-17 | Discharge: 2024-05-17 | Disposition: A | Discharge status: Home / Self Care | Attending: Internal Medicine

## 2024-05-17 DIAGNOSIS — I639 Cerebral infarction, unspecified: Secondary | ICD-10-CM | POA: Diagnosis not present

## 2024-05-17 LAB — ECHOCARDIOGRAM COMPLETE
AR max vel: 1.87 cm2
AV Area VTI: 2.23 cm2
AV Area mean vel: 2.17 cm2
AV Mean grad: 5 mmHg
AV Peak grad: 13 mmHg
Ao pk vel: 1.8 m/s
Area-P 1/2: 3.63 cm2
Calc EF: 52.1 %
Height: 58 in
MV VTI: 2.53 cm2
P 1/2 time: 444 ms
S' Lateral: 3.3 cm
Single Plane A2C EF: 56.2 %
Single Plane A4C EF: 50.9 %
Weight: 2271.62 [oz_av]

## 2024-05-17 MED ORDER — POLYVINYL ALCOHOL 1.4 % OP SOLN
1.0000 [drp] | OPHTHALMIC | Status: DC | PRN
  Administered 2024-05-17: 1 [drp] via OPHTHALMIC
  Filled 2024-05-17: qty 15

## 2024-05-17 MED ORDER — NORTRIPTYLINE HCL 25 MG PO CAPS
50.0000 mg | ORAL_CAPSULE | Freq: Every day | ORAL | Status: DC
  Administered 2024-05-17 – 2024-05-18 (×2): 50 mg via ORAL
  Filled 2024-05-17 (×2): qty 2

## 2024-05-17 MED ORDER — ESCITALOPRAM OXALATE 10 MG PO TABS
20.0000 mg | ORAL_TABLET | Freq: Every day | ORAL | Status: DC
  Administered 2024-05-17 – 2024-05-18 (×2): 20 mg via ORAL
  Filled 2024-05-17 (×2): qty 2

## 2024-05-17 MED ORDER — PROMETHAZINE HCL 6.25 MG/5ML PO SOLN
25.0000 mg | Freq: Four times a day (QID) | ORAL | Status: DC | PRN
  Administered 2024-05-18: 25 mg via ORAL
  Filled 2024-05-17 (×2): qty 20

## 2024-05-17 NOTE — Progress Notes (Signed)
*  PRELIMINARY RESULTS* Echocardiogram 2D Echocardiogram has been performed.  Michelle Conley Michelle Conley 05/17/2024, 1:22 PM

## 2024-05-17 NOTE — Plan of Care (Signed)

## 2024-05-17 NOTE — Progress Notes (Signed)
 PROGRESS NOTE    Michelle Conley  FMW:[id number] DOB: 1970/11/08 DOA: 05/14/2024 PCP: Ziglar, Susan K, MD    Brief Narrative:    53 y.o. 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  She had nausea and vomiting last night.  She denies any chest pain or palpitations.  No cough or wheezing or dyspnea.  She continues to smoke 1 pack of cigarettes per day.  No dysuria, anuria or hematuria or flank pain.  The patient was seen today by ophthalmology Dr. Jaye who noticed Hollenhorst plaque upon fundus exam that is concerning about CRAO.  He also noticed that she was having new left 3rd and 6th cranial nerve palsies      Assessment & Plan:   Principal Problem:   Acute CVA (cerebrovascular accident) (HCC) Active Problems:   Hypokalemia   Anxiety and depression   Hypothyroidism   GERD without esophagitis   Dyslipidemia   Asthma, chronic   Central retinal artery occlusion  Left CRAO Chronic pontine infarct new from June Several months of blurred vision with acute worsening on the left eye.  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  On examination she has concern for 1-1/2 syndrome like presentation due to the interval development of the pontine lesion on MRI.  Extensive MRI and CT imaging survey of head and neck overall reassuring.   Plan: Telemetry Follow-up 2D echocardiogram High intensity statin Continue DAPT aspirin  and Plavix  x 3 weeks followed by aspirin  monotherapy  Intractable nausea and vomiting Chest wall pain This may be due to the location of the infarct Antiemetic regimen Pain regimen Appears to be  improving slowly  GERD Continue PPI  Asthma No exacerbation Continue as needed bronchodilators  Hyperlipidemia Continue high disease statin  Hypothyroidism Continue Synthroid   Anxiety Depression Continue Xanax , Wellbutrin, Abilify   Obesity BMI 32 Complicates overall care and prognosis   DVT prophylaxis: SQ lovenox  Code Status: Full Family Communication: None Disposition Plan: Status is: Inpatient Remains inpatient appropriate because: Intractable nausea and vomiting.  Blurred vision.  Poor tolerance to p.o.   Level of care: Telemetry Medical  Consultants:  Neurology  Procedures:  None  Antimicrobials: None   Subjective: Seen and examined.  Endorses nausea and headache  Objective: Vitals:   05/16/24 1630 05/16/24 2005 05/17/24 0609 05/17/24 0917  BP: (!) 134/54 (!) 147/81 121/61 (!) 140/94  Pulse: 84 85 67 83  Resp: 16     Temp: 98.2 F (36.8 C) 97.7 F (36.5 C) (!) 97.5 F (36.4 C) 97.8 F (36.6 C)  TempSrc:      SpO2: 98% 90% (!) 87% 93%  Weight:      Height:        Intake/Output Summary (Last 24 hours) at 05/17/2024 1456 Last data filed at 05/17/2024 1029 Gross per 24 hour  Intake 410 ml  Output --  Net 410 ml   Filed Weights   05/14/24 1744 05/15/24 1727  Weight: 70 kg 64.4 kg    Examination:  General exam: No acute distress.  Appears fatiguede Respiratory system: Lung clear.  Normal work of breathing.  Room air Cardiovascular system: S1-S2, RRR, no murmurs, no pedal edema Gastrointestinal system: Obese, soft, NT/ND, normal  bowel sounds Central nervous system: Alert and oriented.  Mild right facial droop.  Hearing intact.  Tongue midline.  Abnormal external ocular movements Extremities: Symmetric 5 x 5 power. Skin: No rashes, lesions or ulcers Psychiatry: Judgement and insight appear normal. Mood & affect appropriate.     Data Reviewed: I have personally reviewed following labs and imaging studies  CBC: Recent Labs  Lab  05/14/24 1752 05/15/24 0509  WBC 10.9* 12.8*  NEUTROABS 6.3  --   HGB 14.4 14.1  HCT 42.2 41.5  MCV 90.0 90.6  PLT 275 292   Basic Metabolic Panel: Recent Labs  Lab 05/14/24 1752 05/15/24 0509  NA 136 135  K 3.2* 4.6  CL 99 101  CO2 24 24  GLUCOSE 102* 100*  BUN 15 16  CREATININE 0.91 0.98  CALCIUM  9.3 9.0   GFR: Estimated Creatinine Clearance: 53.9 mL/min (by C-G formula based on SCr of 0.98 mg/dL). Liver Function Tests: Recent Labs  Lab 05/14/24 1752  AST 33  ALT 49*  ALKPHOS 104  BILITOT 0.6  PROT 8.3*  ALBUMIN 4.9   No results for input(s): LIPASE, AMYLASE in the last 168 hours. No results for input(s): AMMONIA in the last 168 hours. Coagulation Profile: Recent Labs  Lab 05/14/24 1752  INR 1.0   Cardiac Enzymes: No results for input(s): CKTOTAL, CKMB, CKMBINDEX, TROPONINI in the last 168 hours. BNP (last 3 results) No results for input(s): PROBNP in the last 8760 hours. HbA1C: No results for input(s): HGBA1C in the last 72 hours. CBG: No results for input(s): GLUCAP in the last 168 hours. Lipid Profile: Recent Labs    05/16/24 1041  CHOL 196  HDL 53  LDLCALC 103*  TRIG 202*  CHOLHDL 3.7   Thyroid  Function Tests: No results for input(s): TSH, T4TOTAL, FREET4, T3FREE, THYROIDAB in the last 72 hours. Anemia Panel: No results for input(s): VITAMINB12, FOLATE, FERRITIN, TIBC, IRON, RETICCTPCT in the last 72 hours. Sepsis Labs: No results for input(s): PROCALCITON, LATICACIDVEN in the last 168 hours.  No results found for this or any previous visit (from the past 240 hours).       Radiology Studies: CT ANGIO HEAD NECK W WO CM Result Date: 05/15/2024 CLINICAL DATA:  [place] EXAM: CT ANGIOGRAPHY HEAD AND NECK WITH AND WITHOUT CONTRAST TECHNIQUE: Multidetector CT imaging of the head and neck was performed using the standard protocol during bolus administration of intravenous contrast. Multiplanar  CT image reconstructions and MIPs were obtained to evaluate the vascular anatomy. Carotid stenosis measurements (when applicable) are obtained utilizing NASCET criteria, using the distal internal carotid diameter as the denominator. RADIATION DOSE REDUCTION: This exam was performed according to the departmental dose-optimization program which includes automated exposure control, adjustment of the mA and/or kV according to patient size and/or use of iterative reconstruction technique. CONTRAST:  75mL OMNIPAQUE  IOHEXOL  350 MG/ML SOLN COMPARISON:  MRI of the head and orbits dated May 15, 2024. FINDINGS: CT HEAD FINDINGS Brain: Normal brain. No evidence of hemorrhage, mass, cortical infarct or hydrocephalus. Vascular: Negative. Skull: Intact and unremarkable. Sinuses/Orbits: Polypoid lesion present posteriorly within the right maxillary sinus. The paranasal sinuses are otherwise clear. Normal orbits. Other: None. Review of the MIP images confirms the above findings CTA NECK FINDINGS Aortic arch: Normal. Right carotid system: The common carotid and internal carotid arteries are normal in caliber and unremarkable. Left carotid system: The common carotid and internal carotid arteries are normal in caliber and unremarkable. Vertebral arteries: The left vertebral artery is dominant. Both  vertebral arteries are normal in caliber throughout the respective courses. Skeleton: Negative. Other neck: Negative. Upper chest: Mild emphysema. Review of the MIP images confirms the above findings CTA HEAD FINDINGS Anterior circulation: The cranial in cavernous segments of the internal carotid arteries are normal in caliber bilaterally. The anterior and middle cerebral arteries and their proximal branches are normal in caliber. No evidence of aneurysm, flow-limiting stenosis or vascular malformation. There is fetal type origin of the right posterior cerebral artery. Posterior circulation: The vertebrobasilar system is unremarkable. The  basilar artery is widely patent. The posterior cerebral arteries and cerebellar arteries are also normal in caliber and widely patent. Venous sinuses: Patent. Anatomic variants: None. Review of the MIP images confirms the above findings IMPRESSION: 1. Normal CT angiogram of the head and neck. 2. Emphysema. Electronically Signed   By: Evalene Coho M.D.   On: 05/15/2024 19:23        Scheduled Meds:  aspirin  EC  81 mg Oral Daily   atorvastatin   80 mg Oral Daily   clopidogrel   75 mg Oral Daily   enoxaparin  (LOVENOX ) injection  40 mg Subcutaneous Q24H   escitalopram   20 mg Oral Daily   furosemide   20 mg Oral Daily   hydrOXYzine   25 mg Oral QHS   icosapent  Ethyl  2 g Oral BID   levothyroxine   150 mcg Oral Q0600   methocarbamol   1,500 mg Oral TID   metoCLOPramide  (REGLAN ) injection  10 mg Intravenous Q8H   montelukast   10 mg Oral QHS   nortriptyline   50 mg Oral QHS   pantoprazole   40 mg Oral Daily   Continuous Infusions:     LOS: 2 days    Calvin KATHEE Robson, MD [facility]   If 7PM-7AM, please contact night-coverage  05/17/2024, 2:56 PM

## 2024-05-17 NOTE — Progress Notes (Signed)
 2D echo remains pending. No clear cause of her CRAO identified. I would send her home on a 30-day cardiac monitor. Follow-up with outpatient neurology in 8 to 12 weeks  -- Eligio Lav, MD Neurologist [facility]

## 2024-05-17 NOTE — Plan of Care (Signed)
  Problem: Pain Managment: Goal: General experience of comfort will improve and/or be controlled Outcome: Progressing   Problem: Safety: Goal: Ability to remain free from injury will improve Outcome: Progressing

## 2024-05-18 DIAGNOSIS — I639 Cerebral infarction, unspecified: Secondary | ICD-10-CM | POA: Diagnosis not present

## 2024-05-18 LAB — HEMOGLOBIN A1C
Hgb A1c MFr Bld: 5.5 % (ref 4.8–5.6)
Mean Plasma Glucose: 111 mg/dL

## 2024-05-18 MED ORDER — PROMETHAZINE HCL 25 MG PO TABS
25.0000 mg | ORAL_TABLET | Freq: Four times a day (QID) | ORAL | 0 refills | Status: DC | PRN
Start: 2024-05-18 — End: 2024-06-16

## 2024-05-18 MED ORDER — HYDROCODONE-ACETAMINOPHEN 5-325 MG PO TABS: 1.0000 | ORAL_TABLET | Freq: Three times a day (TID) | ORAL | 0 refills | Status: DC | PRN

## 2024-05-18 MED ORDER — ASPIRIN 81 MG PO TBEC: 81.0000 mg | DELAYED_RELEASE_TABLET | Freq: Every day | ORAL | Status: AC

## 2024-05-18 MED ORDER — CLOPIDOGREL BISULFATE 75 MG PO TABS: 75.0000 mg | ORAL_TABLET | Freq: Every day | ORAL | 0 refills | Status: AC

## 2024-05-18 NOTE — Plan of Care (Signed)
  Problem: Clinical Measurements: Goal: Ability to maintain clinical measurements within normal limits will improve 05/18/2024 0231 by Melven Odilia PARAS, RN Outcome: Progressing 05/18/2024 0230 by Melven Odilia PARAS, RN Outcome: Progressing Goal: Will remain free from infection 05/18/2024 0231 by Melven Odilia PARAS, RN Outcome: Progressing 05/18/2024 0230 by Melven Odilia PARAS, RN Outcome: Progressing Goal: Diagnostic test results will improve 05/18/2024 0231 by Melven Odilia PARAS, RN Outcome: Progressing 05/18/2024 0230 by Melven Odilia PARAS, RN Outcome: Progressing Goal: Respiratory complications will improve 05/18/2024 0231 by Melven Odilia PARAS, RN Outcome: Progressing 05/18/2024 0230 by Melven Odilia PARAS, RN Outcome: Progressing Goal: Cardiovascular complication will be avoided 05/18/2024 0231 by Melven Odilia PARAS, RN Outcome: Progressing 05/18/2024 0230 by Melven Odilia PARAS, RN Outcome: Progressing   Problem: Activity: Goal: Risk for activity intolerance will decrease 05/18/2024 0231 by Melven Odilia PARAS, RN Outcome: Progressing 05/18/2024 0230 by Melven Odilia PARAS, RN Outcome: Progressing   Problem: Nutrition: Goal: Adequate nutrition will be maintained 05/18/2024 0231 by Melven Odilia PARAS, RN Outcome: Progressing 05/18/2024 0230 by Melven Odilia PARAS, RN Outcome: Progressing   Problem: Coping: Goal: Level of anxiety will decrease 05/18/2024 0231 by Melven Odilia PARAS, RN Outcome: Progressing 05/18/2024 0230 by Melven Odilia PARAS, RN Outcome: Progressing   Problem: Elimination: Goal: Will not experience complications related to bowel motility 05/18/2024 0231 by Melven Odilia PARAS, RN Outcome: Progressing 05/18/2024 0230 by Melven Odilia PARAS, RN Outcome: Progressing Goal: Will not experience complications related to urinary retention 05/18/2024 0231 by Melven Odilia PARAS, RN Outcome: Progressing 05/18/2024 0230 by Melven Odilia PARAS, RN Outcome: Progressing   Problem: Pain Managment: Goal: General  experience of comfort will improve and/or be controlled 05/18/2024 0231 by Melven Odilia PARAS, RN Outcome: Progressing 05/18/2024 0230 by Melven Odilia PARAS, RN Outcome: Progressing   Problem: Safety: Goal: Ability to remain free from injury will improve 05/18/2024 0231 by Melven Odilia PARAS, RN Outcome: Progressing 05/18/2024 0230 by Melven Odilia PARAS, RN Outcome: Progressing   Problem: Skin Integrity: Goal: Risk for impaired skin integrity will decrease 05/18/2024 0231 by Melven Odilia PARAS, RN Outcome: Progressing 05/18/2024 0230 by Melven Odilia PARAS, RN Outcome: Progressing

## 2024-05-18 NOTE — Plan of Care (Signed)

## 2024-05-18 NOTE — Discharge Summary (Signed)
 Physician Discharge Summary  Michelle Conley FMW:[id number] DOB: 07-Nov-1970 DOA: 05/14/2024  PCP: Ziglar, Susan K, MD  Admit date: 05/14/2024 Discharge date: 05/18/2024  Admitted From: Home Disposition:  Home  Recommendations for Outpatient Follow-up:  Follow up with PCP in 1-2 weeks Ambulatory referral to neurology  Home Health: No Equipment/Devices: None  Discharge Condition: Stable CODE STATUS: Full Diet recommendation: Heart healthy  Brief/Interim Summary:  53 y.o. 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  She had nausea and vomiting last night.  She denies any chest pain or palpitations.  No cough or wheezing or dyspnea.  She continues to smoke 1 pack of cigarettes per day.  No dysuria, anuria or hematuria or flank pain.  The patient was seen today by ophthalmology Dr. Jaye who noticed Hollenhorst plaque upon fundus exam that is concerning about CRAO.  He also noticed that she was having new left 3rd and 6th cranial nerve palsies       Discharge Diagnoses:  Principal Problem:   Acute CVA (cerebrovascular accident) (HCC) Active Problems:   Hypokalemia   Anxiety and depression   Hypothyroidism   GERD without esophagitis   Dyslipidemia   Asthma, chronic   Central retinal artery occlusion  Left CRAO Chronic pontine infarct new from June Several months of blurred vision with acute worsening on the left eye.  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  On examination she has concern for 1-1/2 syndrome like presentation due to the interval development of the pontine lesion on MRI.  Extensive MRI and CT  imaging survey of head and neck overall reassuring.  2D echocardiogram normal.  Hemoglobin A1c within normal limits.  Nondiabetic range. Plan: Discharge home.  Continue high intensity statin.  Continue DAPT aspirin  and Plavix  x 3 weeks followed by aspirin  monotherapy.  Ambulatory referral to outpatient neurology.  Intractable nausea and vomiting Chest wall pain This may be due to the location of the infarct Antiemetic regimen Pain regimen Appears improved on discharge   GERD Continue PPI   Asthma No exacerbation Continue as needed bronchodilators   Hyperlipidemia Continue high disease statin   Hypothyroidism Continue Synthroid    Anxiety Depression Continue Xanax , Wellbutrin, Abilify    Obesity BMI 32 Complicates overall care and prognosis  Discharge Instructions  Discharge Instructions     Ambulatory referral to Neurology   Complete by: As directed    An appointment is requested in approximately: 8 weeks   Diet - low sodium heart healthy   Complete by: As directed    Increase activity slowly   Complete by: As directed       Allergies as of 05/18/2024       Reactions   Bee Pollen Anaphylaxis, Swelling   Paroxetine Hcl Shortness Of Breath   Other reaction(s): OTHER   Rofecoxib Other (See Comments), Anaphylaxis   vioxx Other reaction(s): Other (See Comments), Unknown  vioxx   Wasp Venom Anaphylaxis, Swelling   Penicillins Nausea And Vomiting, Rash, Other (See Comments)   Other reaction(s): HIVES  Other Reaction: Not Assessed   Tramadol Other (See Comments), Nausea Only, Rash   Other Reaction: Not Assessed Other reaction(s): Other (See Comments)  Other Reaction: Not Assessed  Other  Reaction: Not Assessed   Codeine    Other reaction(s): ITCHING   Esomeprazole Magnesium  Other (See Comments)   Headaches   Fluoxetine Other (See Comments)   Ketorolac  Other (See Comments)   Migraine Other reaction(s): Other (See Comments), Unknown  Pt reports causes  rebound migraines   Meloxicam Other (See Comments)   Toradol  [ketorolac  Tromethamine ] Other (See Comments)   Pt reports causes rebound migraines   Trazodone    Other reaction(s): OTHER   Amitriptyline Nausea And Vomiting   Cephalexin Rash   Genital Itching   Cyclobenzaprine Other (See Comments), Itching   Migraine Other reaction(s): Unknown   Metronidazole Other (See Comments), Nausea Only   Other Reaction: Not Assessed Migraine  Other reaction(s): Other (See Comments)  Other Reaction: Not Assessed  Other Reaction: Not Assessed  Other Reaction: Not Assessed        Medication List     STOP taking these medications    ARIPiprazole  5 MG tablet Commonly known as: ABILIFY    buPROPion ER 100 MG 12 hr tablet Commonly known as: WELLBUTRIN SR   clobetasol 0.05 % external solution Commonly known as: TEMOVATE   fluticasone 50 MCG/ACT nasal spray Commonly known as: FLONASE   hydrOXYzine  25 MG tablet Commonly known as: ATARAX    montelukast  10 MG tablet Commonly known as: SINGULAIR    Na Sulfate-K Sulfate-Mg Sulfate concentrate 17.5-3.13-1.6 GM/177ML Soln Commonly known as: SUPREP   simvastatin  20 MG tablet Commonly known as: ZOCOR    tiotropium 18 MCG inhalation capsule Commonly known as: SPIRIVA        TAKE these medications    acetaminophen  500 MG tablet Commonly known as: TYLENOL  Take 500 mg by mouth every 8 (eight) hours as needed for headache, mild pain (pain score 1-3) or fever.   AeroChamber MV inhaler Use as instructed   albuterol  108 (90 Base) MCG/ACT inhaler Commonly known as: VENTOLIN  HFA Inhale 2 puffs into the lungs every 6 (six) hours as needed for wheezing or shortness of breath.   alprazolam  2 MG tablet Commonly known as: XANAX  Take 2 mg by mouth 4 (four) times daily.   aspirin  EC 81 MG tablet Take 1 tablet (81 mg total) by mouth daily. Swallow whole. Start taking on: May 19, 2024   atorvastatin  80 MG tablet Commonly known as:  LIPITOR Take 1 tablet (80 mg total) by mouth daily.   clopidogrel  75 MG tablet Commonly known as: PLAVIX  Take 1 tablet (75 mg total) by mouth daily for 21 days. Start taking on: May 19, 2024   Dupixent 300 MG/2ML Soaj Generic drug: Dupilumab Inject into the skin.   EPINEPHrine  0.3 mg/0.3 mL Soaj injection Commonly known as: EPI-PEN Inject 0.3 mg into the muscle once as needed (allergic reaction).   escitalopram  20 MG tablet Commonly known as: LEXAPRO  Take 20 mg by mouth daily.   furosemide  20 MG tablet Commonly known as: LASIX  Take 1 tablet (20 mg total) by mouth daily.   HYDROcodone -acetaminophen  5-325 MG tablet Commonly known as: NORCO/VICODIN Take 1 tablet by mouth every 8 (eight) hours as needed for moderate pain (pain score 4-6).   ibuprofen  800 MG tablet Commonly known as: ADVIL  Take 1 tablet (800 mg total) by mouth every 8 (eight) hours as needed for mild pain (pain score 1-3) or moderate pain (pain score 4-6).   icosapent  Ethyl 1 g capsule Commonly known as: Vascepa  Take 4 capsules (4 g total) by mouth 2 (two) times daily. What changed: how much to take   ipratropium-albuterol  0.5-2.5 (3)  MG/3ML Soln Commonly known as: DUONEB Inhale 3 mLs into the lungs every 6 (six) hours as needed.   levothyroxine  150 MCG tablet Commonly known as: SYNTHROID  Take 1 tablet (150 mcg total) by mouth daily.   methocarbamol  750 MG tablet Commonly known as: ROBAXIN  Take 2 tablets (1,500 mg total) by mouth 3 (three) times daily.   naloxone  4 MG/0.1ML Liqd nasal spray kit Commonly known as: NARCAN  Call 911. Administer a single spray in one nostril. If no or minimal response after 2 to 3 minutes, an additional dose may be given in the alternate nostril.   nicotine 21 mg/24hr patch Commonly known as: NICODERM CQ - dosed in mg/24 hours Place onto the skin.   nortriptyline  50 MG capsule Commonly known as: PAMELOR  Take 50 mg by mouth at bedtime.   omeprazole  20 MG  capsule Commonly known as: PRILOSEC Take 1 capsule (20 mg total) by mouth daily. What changed: how much to take   promethazine  25 MG tablet Commonly known as: PHENERGAN  Take 1 tablet (25 mg total) by mouth every 6 (six) hours as needed.   Trelegy Ellipta  100-62.5-25 MCG/ACT Aepb Generic drug: Fluticasone-Umeclidin-Vilant Inhale 1 Inhalation into the lungs daily.   Vitamin D  (Ergocalciferol ) 1.25 MG (50000 UNIT) Caps capsule Commonly known as: DRISDOL  Take 1 capsule (50,000 Units total) by mouth every 7 (seven) days.        Follow-up Information     Paraschos, Alexander, MD. Go in 6 week(s).   Specialty: Cardiology Why: Come in tomorrow for event monitor placement and then we will schedule follow up appointment with Dr. Ammon in 6 weeks. Contact information: [address] [facility] [place] [address] [phone]                Allergies  Allergen Reactions   Bee Pollen Anaphylaxis and Swelling   Paroxetine Hcl Shortness Of Breath    Other reaction(s): OTHER   Rofecoxib Other (See Comments) and Anaphylaxis    vioxx  Other reaction(s): Other (See Comments), Unknown  vioxx   Wasp Venom Anaphylaxis and Swelling   Penicillins Nausea And Vomiting, Rash and Other (See Comments)    Other reaction(s): HIVES  Other Reaction: Not Assessed   Tramadol Other (See Comments), Nausea Only and Rash    Other Reaction: Not Assessed  Other reaction(s): Other (See Comments)  Other Reaction: Not Assessed  Other Reaction: Not Assessed   Codeine     Other reaction(s): ITCHING   Esomeprazole Magnesium  Other (See Comments)    Headaches   Fluoxetine Other (See Comments)   Ketorolac  Other (See Comments)    Migraine  Other reaction(s): Other (See Comments), Unknown  Pt reports causes rebound migraines   Meloxicam Other (See Comments)   Toradol  [Ketorolac  Tromethamine ] Other (See Comments)    Pt reports causes rebound migraines    Trazodone     Other reaction(s): OTHER   Amitriptyline Nausea And Vomiting   Cephalexin Rash    Genital Itching   Cyclobenzaprine Other (See Comments) and Itching    Migraine  Other reaction(s): Unknown   Metronidazole Other (See Comments) and Nausea Only    Other Reaction: Not Assessed  Migraine   Other reaction(s): Other (See Comments)  Other Reaction: Not Assessed  Other Reaction: Not Assessed  Other Reaction: Not Assessed    Consultations: Neurology   Procedures/Studies: ECHOCARDIOGRAM COMPLETE Result Date: 05/17/2024    ECHOCARDIOGRAM REPORT   Patient Name:   PEITYN PAYTON Date of Exam: 05/17/2024 Medical Rec #:  [id number]       Height:       58.0 in Accession #:    [id number]      Weight:       142.0 lb Date of Birth:  March 04, 1971      BSA:          1.574 m Patient Age:    52 years        BP:           121/61 mmHg Patient Gender: F               HR:           73 bpm. Exam Location:  [facility] Procedure: 2D Echo, Cardiac Doppler and Color Doppler (Both Spectral and Color            Flow Doppler were utilized during procedure). Indications:     Stroke I63.9  History:         Patient has no prior history of Echocardiogram examinations.                  Stroke; Risk Factors:Hypertension.  Sonographer:     Bari Roar Referring Phys:  [id number] CALVIN KATHEE ROBSON Diagnosing Phys: Cara JONETTA Lovelace MD IMPRESSIONS  1. Left ventricular ejection fraction, by estimation, is 50 to 55%. The left ventricle has low normal function. The left ventricle has no regional wall motion abnormalities. There is mild concentric left ventricular hypertrophy. Left ventricular diastolic parameters are consistent with Grade I diastolic dysfunction (impaired relaxation).  2. Right ventricular systolic function is normal. The right ventricular size is normal.  3. The mitral valve is normal in structure. No evidence of mitral valve regurgitation.  4. The aortic valve is normal in structure. Aortic valve regurgitation is  not visualized. FINDINGS  Left Ventricle: Left ventricular ejection fraction, by estimation, is 50 to 55%. The left ventricle has low normal function. The left ventricle has no regional wall motion abnormalities. Strain was performed and the global longitudinal strain is indeterminate. The left ventricular internal cavity size was normal in size. There is mild concentric left ventricular hypertrophy. Left ventricular diastolic parameters are consistent with Grade I diastolic dysfunction (impaired relaxation). Right Ventricle: The right ventricular size is normal. No increase in right ventricular wall thickness. Right ventricular systolic function is normal. Left Atrium: Left atrial size was normal in size. Right Atrium: Right atrial size was normal in size. Pericardium: There is no evidence of pericardial effusion. Mitral Valve: The mitral valve is normal in structure. No evidence of mitral valve regurgitation. MV peak gradient, 5.0 mmHg. The mean mitral valve gradient is 2.0 mmHg. Tricuspid Valve: The tricuspid valve is normal in structure. Tricuspid valve regurgitation is trivial. Aortic Valve: The aortic valve is normal in structure. Aortic valve regurgitation is not visualized. Aortic regurgitation PHT measures 444 msec. Aortic valve mean gradient measures 5.0 mmHg. Aortic valve peak gradient measures 13.0 mmHg. Aortic valve area, by VTI measures 2.23 cm. Pulmonic Valve: The pulmonic valve was normal in structure. Pulmonic valve regurgitation is not visualized. Aorta: The ascending aorta was not well visualized. IAS/Shunts: No atrial level shunt detected by color flow Doppler. Additional Comments: 3D was performed not requiring image post processing on an independent workstation and was indeterminate.  LEFT VENTRICLE PLAX 2D LVIDd:         4.40 cm     Diastology LVIDs:         3.30 cm     LV e' medial:  7.51 cm/s LV PW:         1.20 cm     LV E/e' medial:  11.1 LV IVS:        1.30 cm     LV e' lateral:   9.46  cm/s LVOT diam:     1.80 cm     LV E/e' lateral: 8.8 LV SV:         62 LV SV Index:   39 LVOT Area:     2.54 cm  LV Volumes (MOD) LV vol d, MOD A2C: 95.7 ml LV vol d, MOD A4C: 87.2 ml LV vol s, MOD A2C: 41.9 ml LV vol s, MOD A4C: 42.8 ml LV SV MOD A2C:     53.8 ml LV SV MOD A4C:     87.2 ml LV SV MOD BP:      48.8 ml RIGHT VENTRICLE RV Basal diam:  2.30 cm RV Mid diam:    2.00 cm RV S prime:     11.50 cm/s TAPSE (M-mode): 2.0 cm LEFT ATRIUM             Index        RIGHT ATRIUM           Index LA diam:        3.50 cm 2.22 cm/m   RA Area:     10.60 cm LA Vol (A2C):   32.8 ml 20.83 ml/m  RA Volume:   20.30 ml  12.89 ml/m LA Vol (A4C):   35.6 ml 22.61 ml/m LA Biplane Vol: 34.4 ml 21.85 ml/m  AORTIC VALVE                    PULMONIC VALVE AV Area (Vmax):    1.87 cm     PV Vmax:        0.95 m/s AV Area (Vmean):   2.17 cm     PV Peak grad:   3.6 mmHg AV Area (VTI):     2.23 cm     RVOT Peak grad: 3 mmHg AV Vmax:           180.00 cm/s AV Vmean:          96.400 cm/s AV VTI:            0.277 m AV Peak Grad:      13.0 mmHg AV Mean Grad:      5.0 mmHg LVOT Vmax:         132.00 cm/s LVOT Vmean:        82.300 cm/s LVOT VTI:          0.243 m LVOT/AV VTI ratio: 0.88 AI PHT:            444 msec  AORTA Ao Root diam: 2.50 cm Ao Asc diam:  2.70 cm MITRAL VALVE MV Area (PHT): 3.63 cm    SHUNTS MV Area VTI:   2.53 cm    Systemic VTI:  0.24 m MV Peak grad:  5.0 mmHg    Systemic Diam: 1.80 cm MV Mean grad:  2.0 mmHg MV Vmax:       1.12 m/s MV Vmean:      65.6 cm/s MV Decel Time: 209 msec MV E velocity: 83.10 cm/s MV A velocity: 97.00 cm/s MV E/A ratio:  0.86 MV A Prime:    6.8 cm/s Dwayne JONETTA Lovelace MD Electronically signed by Cara JONETTA Lovelace MD Signature Date/Time: 05/17/2024/6:38:28 PM    Final    CT  ANGIO HEAD NECK W WO CM Result Date: 05/15/2024 CLINICAL DATA:  Diplopia EXAM: CT ANGIOGRAPHY HEAD AND NECK WITH AND WITHOUT CONTRAST TECHNIQUE: Multidetector CT imaging of the head and neck was performed using the standard  protocol during bolus administration of intravenous contrast. Multiplanar CT image reconstructions and MIPs were obtained to evaluate the vascular anatomy. Carotid stenosis measurements (when applicable) are obtained utilizing NASCET criteria, using the distal internal carotid diameter as the denominator. RADIATION DOSE REDUCTION: This exam was performed according to the departmental dose-optimization program which includes automated exposure control, adjustment of the mA and/or kV according to patient size and/or use of iterative reconstruction technique. CONTRAST:  75mL OMNIPAQUE  IOHEXOL  350 MG/ML SOLN COMPARISON:  MRI of the head and orbits dated May 15, 2024. FINDINGS: CT HEAD FINDINGS Brain: Normal brain. No evidence of hemorrhage, mass, cortical infarct or hydrocephalus. Vascular: Negative. Skull: Intact and unremarkable. Sinuses/Orbits: Polypoid lesion present posteriorly within the right maxillary sinus. The paranasal sinuses are otherwise clear. Normal orbits. Other: None. Review of the MIP images confirms the above findings CTA NECK FINDINGS Aortic arch: Normal. Right carotid system: The common carotid and internal carotid arteries are normal in caliber and unremarkable. Left carotid system: The common carotid and internal carotid arteries are normal in caliber and unremarkable. Vertebral arteries: The left vertebral artery is dominant. Both vertebral arteries are normal in caliber throughout the respective courses. Skeleton: Negative. Other neck: Negative. Upper chest: Mild emphysema. Review of the MIP images confirms the above findings CTA HEAD FINDINGS Anterior circulation: The cranial in cavernous segments of the internal carotid arteries are normal in caliber bilaterally. The anterior and middle cerebral arteries and their proximal branches are normal in caliber. No evidence of aneurysm, flow-limiting stenosis or vascular malformation. There is fetal type origin of the right posterior cerebral  artery. Posterior circulation: The vertebrobasilar system is unremarkable. The basilar artery is widely patent. The posterior cerebral arteries and cerebellar arteries are also normal in caliber and widely patent. Venous sinuses: Patent. Anatomic variants: None. Review of the MIP images confirms the above findings IMPRESSION: 1. Normal CT angiogram of the head and neck. 2. Emphysema. Electronically Signed   By: Evalene Coho M.D.   On: 05/15/2024 19:23   MR ORBITS W WO CONTRAST Result Date: 05/15/2024 CLINICAL DATA:  Diplopia, central retinal artery occlusion. EXAM: MRI HEAD WITH CONTRAST MRI ORBITS WITHOUT AND WITH CONTRAST TECHNIQUE: Multiplanar, multiecho pulse sequences of the brain and surrounding structures were obtained with intravenous contrast. Multiplanar, multiecho pulse sequences of the orbits and surrounding structures were obtained including fat saturation techniques, before and after intravenous contrast administration. CONTRAST:  7mL GADAVIST  GADOBUTROL  1 MMOL/ML IV SOLN COMPARISON:  MRI head and MRA head 05/14/2024. MRI head and MR venogram 04/06/2024. FINDINGS: MRI HEAD FINDINGS Brain: No abnormal intracranial enhancement. No edema or midline shift. Normal appearance of the white matter. Basilar cisterns are patent. Posterior fossa is unremarkable. No extra-axial fluid collections. Ventricles: Normal size and configuration of the ventricles. Vascular: Skull base flow voids are visualized. Skull and upper cervical spine: No focal abnormality. Other: Bilateral mastoid effusions. Similar appearance of adenoid hypertrophy. MRI ORBITS FINDINGS Orbits: No traumatic or inflammatory finding. Globes, optic nerves, orbital fat, extraocular muscles, vascular structures, and lacrimal glands are normal. Visualized sinuses: Mucous retention cyst in the right maxillary sinus. Similar appearance of cystic focus within the nasal septum. Soft tissues: The soft tissues are unremarkable. IMPRESSION: No abnormal  intracranial enhancement. Unremarkable MRI of the orbits. Electronically Signed   By:  Donnice Mania M.D.   On: 05/15/2024 12:37   MR BRAIN W CONTRAST Result Date: 05/15/2024 CLINICAL DATA:  Diplopia, central retinal artery occlusion. EXAM: MRI HEAD WITH CONTRAST MRI ORBITS WITHOUT AND WITH CONTRAST TECHNIQUE: Multiplanar, multiecho pulse sequences of the brain and surrounding structures were obtained with intravenous contrast. Multiplanar, multiecho pulse sequences of the orbits and surrounding structures were obtained including fat saturation techniques, before and after intravenous contrast administration. CONTRAST:  7mL GADAVIST  GADOBUTROL  1 MMOL/ML IV SOLN COMPARISON:  MRI head and MRA head 05/14/2024. MRI head and MR venogram 04/06/2024. FINDINGS: MRI HEAD FINDINGS Brain: No abnormal intracranial enhancement. No edema or midline shift. Normal appearance of the white matter. Basilar cisterns are patent. Posterior fossa is unremarkable. No extra-axial fluid collections. Ventricles: Normal size and configuration of the ventricles. Vascular: Skull base flow voids are visualized. Skull and upper cervical spine: No focal abnormality. Other: Bilateral mastoid effusions. Similar appearance of adenoid hypertrophy. MRI ORBITS FINDINGS Orbits: No traumatic or inflammatory finding. Globes, optic nerves, orbital fat, extraocular muscles, vascular structures, and lacrimal glands are normal. Visualized sinuses: Mucous retention cyst in the right maxillary sinus. Similar appearance of cystic focus within the nasal septum. Soft tissues: The soft tissues are unremarkable. IMPRESSION: No abnormal intracranial enhancement. Unremarkable MRI of the orbits. Electronically Signed   By: Donnice Mania M.D.   On: 05/15/2024 12:37   MR ANGIO HEAD WO CONTRAST Result Date: 05/14/2024 CLINICAL DATA:  Central retinal artery occlusion, cranial nerve 3 and 6 palsy, ptosis. Evaluation for stroke and arterial occlusion. EXAM: MRI HEAD  WITHOUT CONTRAST MRA HEAD WITHOUT CONTRAST TECHNIQUE: Multiplanar, multi-echo pulse sequences of the brain and surrounding structures were acquired without intravenous contrast. Angiographic images of the Circle of Willis were acquired using MRA technique without intravenous contrast. COMPARISON:  Earlier same day CT head. MR head and MR venogram 04/06/2024. FINDINGS: MRI HEAD FINDINGS Brain: No acute infarct. No evidence of intracranial hemorrhage. Focal T2/FLAIR hyperintensity within the right paramedian pons is new from the prior MRI. White matter is otherwise unremarkable. No edema, mass effect, or midline shift. Posterior fossa is unremarkable. Normal appearance of midline structures. The basilar cisterns are patent. No extra-axial fluid collections. Ventricles: Normal size and configuration of the ventricles. Vascular: Skull base flow voids are visualized. Skull and upper cervical spine: No focal abnormality. Sinuses/Orbits: Orbits are symmetric. Mucous retention cyst in the posterior right maxillary sinus. Small cystic focus within the anterior nasal septum. Other: Bilateral mastoid effusions. Similar hypertrophy of the adenoids. MRA HEAD FINDINGS Anterior circulation: The intracranial internal carotid arteries are patent bilaterally without significant irregularity or stenosis. The right M1 segment and MCA bifurcation are patent. Proximal M2 branches of the right MCA are patent. Distal right MCA branches are patent. The left M1 segment and left MCA bifurcation are patent. Proximal left M2 branches are patent. Distal left MCA branches are patent. The A1 and A2 segments are patent bilaterally. Distal ACA branches are patent. Posterior circulation: The visualized intracranial vertebral arteries are patent. Left vertebral artery is dominant. The basilar artery is patent. The bilateral posterior cerebral arteries are patent. Posterior communicating artery visualized on the right. Superior cerebellar arteries are  patent bilaterally. AICA visualized on the right. PICA partially visualized on the left. Anatomic variants: No significant variant. IMPRESSION: No acute intracranial abnormality. Patent intracranial arterial vasculature. Small focus of T2 hyperintensity within the right paramedian pons is new since 04/06/2024 and may reflect a focus of remote infarct versus chronic microvascular ischemic change. Electronically Signed  By: Donnice Mania M.D.   On: 05/14/2024 22:26   MR BRAIN WO CONTRAST Result Date: 05/14/2024 CLINICAL DATA:  Central retinal artery occlusion, cranial nerve 3 and 6 palsy, ptosis. Evaluation for stroke and arterial occlusion. EXAM: MRI HEAD WITHOUT CONTRAST MRA HEAD WITHOUT CONTRAST TECHNIQUE: Multiplanar, multi-echo pulse sequences of the brain and surrounding structures were acquired without intravenous contrast. Angiographic images of the Circle of Willis were acquired using MRA technique without intravenous contrast. COMPARISON:  Earlier same day CT head. MR head and MR venogram 04/06/2024. FINDINGS: MRI HEAD FINDINGS Brain: No acute infarct. No evidence of intracranial hemorrhage. Focal T2/FLAIR hyperintensity within the right paramedian pons is new from the prior MRI. White matter is otherwise unremarkable. No edema, mass effect, or midline shift. Posterior fossa is unremarkable. Normal appearance of midline structures. The basilar cisterns are patent. No extra-axial fluid collections. Ventricles: Normal size and configuration of the ventricles. Vascular: Skull base flow voids are visualized. Skull and upper cervical spine: No focal abnormality. Sinuses/Orbits: Orbits are symmetric. Mucous retention cyst in the posterior right maxillary sinus. Small cystic focus within the anterior nasal septum. Other: Bilateral mastoid effusions. Similar hypertrophy of the adenoids. MRA HEAD FINDINGS Anterior circulation: The intracranial internal carotid arteries are patent bilaterally without significant  irregularity or stenosis. The right M1 segment and MCA bifurcation are patent. Proximal M2 branches of the right MCA are patent. Distal right MCA branches are patent. The left M1 segment and left MCA bifurcation are patent. Proximal left M2 branches are patent. Distal left MCA branches are patent. The A1 and A2 segments are patent bilaterally. Distal ACA branches are patent. Posterior circulation: The visualized intracranial vertebral arteries are patent. Left vertebral artery is dominant. The basilar artery is patent. The bilateral posterior cerebral arteries are patent. Posterior communicating artery visualized on the right. Superior cerebellar arteries are patent bilaterally. AICA visualized on the right. PICA partially visualized on the left. Anatomic variants: No significant variant. IMPRESSION: No acute intracranial abnormality. Patent intracranial arterial vasculature. Small focus of T2 hyperintensity within the right paramedian pons is new since 04/06/2024 and may reflect a focus of remote infarct versus chronic microvascular ischemic change. Electronically Signed   By: Donnice Mania M.D.   On: 05/14/2024 22:26   CT HEAD WO CONTRAST Result Date: 05/14/2024 CLINICAL DATA:  Headache, neuro deficit, vision difficulty for the last month. EXAM: CT HEAD WITHOUT CONTRAST TECHNIQUE: Contiguous axial images were obtained from the base of the skull through the vertex without intravenous contrast. RADIATION DOSE REDUCTION: This exam was performed according to the departmental dose-optimization program which includes automated exposure control, adjustment of the mA and/or kV according to patient size and/or use of iterative reconstruction technique. COMPARISON:  CT head and MRI head 04/06/2024. FINDINGS: Brain: No acute intracranial hemorrhage. No CT evidence of acute infarct. No edema, mass effect, or midline shift. The basilar cisterns are patent. Ventricles: The ventricles are normal. Vascular: No hyperdense  vessel or unexpected calcification. Skull: No acute or aggressive finding. Orbits: Orbits are symmetric. Sinuses: Mild mucosal thickening in the partially visualized right maxillary sinus. Other: Small bilateral mastoid effusions similar to prior. IMPRESSION: No CT evidence of acute intracranial abnormality. Electronically Signed   By: Donnice Mania M.D.   On: 05/14/2024 18:39      Subjective: Seen and examined on the day of discharge.  Stable no distress.  Appropriate for discharge home.  Discharge Exam: Vitals:   05/18/24 0354 05/18/24 0740  BP: (!) 137/54 139/65  Pulse: 61  77  Resp:  16  Temp: 98.2 F (36.8 C) 98.7 F (37.1 C)  SpO2: 91% 90%   Vitals:   05/17/24 1648 05/17/24 1957 05/18/24 0354 05/18/24 0740  BP: (!) 149/55 (!) 138/58 (!) 137/54 139/65  Pulse: 68 70 61 77  Resp:    16  Temp: 98 F (36.7 C) 98 F (36.7 C) 98.2 F (36.8 C) 98.7 F (37.1 C)  TempSrc: Oral  Oral Oral  SpO2: 91% 93% 91% 90%  Weight:   66.3 kg   Height:        General: Pt is alert, awake, not in acute distress Cardiovascular: RRR, S1/S2 +, no rubs, no gallops Respiratory: CTA bilaterally, no wheezing, no rhonchi Abdominal: Soft, NT, ND, bowel sounds + Extremities: no edema, no cyanosis    The results of significant diagnostics from this hospitalization (including imaging, microbiology, ancillary and laboratory) are listed below for reference.     Microbiology: No results found for this or any previous visit (from the past 240 hours).   Labs: BNP (last 3 results) No results for input(s): BNP in the last 8760 hours. Basic Metabolic Panel: Recent Labs  Lab 05/14/24 1752 05/15/24 0509  NA 136 135  K 3.2* 4.6  CL 99 101  CO2 24 24  GLUCOSE 102* 100*  BUN 15 16  CREATININE 0.91 0.98  CALCIUM  9.3 9.0   Liver Function Tests: Recent Labs  Lab 05/14/24 1752  AST 33  ALT 49*  ALKPHOS 104  BILITOT 0.6  PROT 8.3*  ALBUMIN 4.9   No results for input(s): LIPASE,  AMYLASE in the last 168 hours. No results for input(s): AMMONIA in the last 168 hours. CBC: Recent Labs  Lab 05/14/24 1752 05/15/24 0509  WBC 10.9* 12.8*  NEUTROABS 6.3  --   HGB 14.4 14.1  HCT 42.2 41.5  MCV 90.0 90.6  PLT 275 292   Cardiac Enzymes: No results for input(s): CKTOTAL, CKMB, CKMBINDEX, TROPONINI in the last 168 hours. BNP: Invalid input(s): POCBNP CBG: No results for input(s): GLUCAP in the last 168 hours. D-Dimer No results for input(s): DDIMER in the last 72 hours. Hgb A1c No results for input(s): HGBA1C in the last 72 hours. Lipid Profile Recent Labs    05/16/24 1041  CHOL 196  HDL 53  LDLCALC 103*  TRIG 202*  CHOLHDL 3.7   Thyroid  function studies No results for input(s): TSH, T4TOTAL, T3FREE, THYROIDAB in the last 72 hours.  Invalid input(s): FREET3 Anemia work up No results for input(s): VITAMINB12, FOLATE, FERRITIN, TIBC, IRON, RETICCTPCT in the last 72 hours. Urinalysis    Component Value Date/Time   COLORURINE Yellow 04/20/2014 0741   APPEARANCEUR Cloudy 04/20/2014 0741   LABSPEC 1.021 04/20/2014 0741   PHURINE 6.0 04/20/2014 0741   GLUCOSEU Negative 04/20/2014 0741   HGBUR 1+ 04/20/2014 0741   BILIRUBINUR Negative 04/20/2014 0741   KETONESUR Negative 04/20/2014 0741   PROTEINUR 30 mg/dL 93/83/7984 9258   NITRITE Negative 04/20/2014 0741   LEUKOCYTESUR Negative 04/20/2014 0741   Sepsis Labs Recent Labs  Lab 05/14/24 1752 05/15/24 0509  WBC 10.9* 12.8*   Microbiology No results found for this or any previous visit (from the past 240 hours).   Time coordinating discharge: 40 minutes  SIGNED:   Calvin KATHEE Robson, MD  [facility] 05/18/2024, 1:55 PM Pager   If 7PM-7AM, please contact night-coverage

## 2024-05-19 ENCOUNTER — Telehealth: Payer: Self-pay | Admitting: *Deleted

## 2024-05-19 NOTE — Transitions of Care (Post Inpatient/ED Visit) (Signed)
 05/19/2024  Name: Michelle Conley MRN: [id number] DOB: 06-03-1971  Today's TOC FU Call Status: Today's TOC FU Call Status:: Successful TOC FU Call Completed TOC FU Call Complete Date: 05/19/24 Patient's Name and Date of Birth confirmed.  Transition Care Management Follow-up Telephone Call Date of Discharge: 05/18/24 Discharge Facility: [facility]) Type of Discharge: Inpatient Admission Primary Inpatient Discharge Diagnosis:: Acute CVA How have you been since you were released from the hospital?: Better Any questions or concerns?: Yes Patient Questions/Concerns:: Patient expresses concern over finding a new PCP that will prescribe alprazolam  Patient Questions/Concerns Addressed: Other: (Provided patient with MD referral line [phone])  Items Reviewed: Did you receive and understand the discharge instructions provided?: Yes Medications obtained,verified, and reconciled?: Yes (Medications Reviewed) Any new allergies since your discharge?: No Dietary orders reviewed?: Yes Type of Diet Ordered:: Heart healthy, low sodium Do you have support at home?: Yes People in Home [RPT]: sibling(s) Name of Support/Comfort Primary Source: Sister/Margie  Medications Reviewed Today: Medications Reviewed Today     Reviewed by Lucky Andrea LABOR, RN (Registered Nurse) on 05/19/24 at 1050  Med List Status: <None>   Medication Order Taking? Sig Documenting Provider Last Dose Status Informant  acetaminophen  (TYLENOL ) 500 MG tablet [id number]  Take 500 mg by mouth every 8 (eight) hours as needed for headache, mild pain (pain score 1-3) or fever.  Patient not taking: Reported on 05/19/2024   [provider]  Active Self  albuterol  (VENTOLIN  HFA) 108 (90 Base) MCG/ACT inhaler [id number] Yes Inhale 2 puffs into the lungs every 6 (six) hours as needed for wheezing or shortness of breath. Ziglar, Susan K, MD  Active Self  alprazolam  (XANAX ) 2 MG tablet [id number] Yes Take  2 mg by mouth 4 (four) times daily. [provider]  Active Self  aspirin  EC 81 MG tablet [id number]  Take 1 tablet (81 mg total) by mouth daily. Swallow whole.  Patient not taking: Reported on 05/19/2024   Jhonny Calvin NOVAK, MD  Active   atorvastatin  (LIPITOR) 80 MG tablet [id number] Yes Take 1 tablet (80 mg total) by mouth daily. Ziglar, Susan K, MD  Active Self  clopidogrel  (PLAVIX ) 75 MG tablet [id number]  Take 1 tablet (75 mg total) by mouth daily for 21 days.  Patient not taking: Reported on 05/19/2024   Jhonny Calvin NOVAK, MD  Active   DUPIXENT 300 MG/2ML EMMANUEL [id number]  Inject into the skin.  Patient not taking: Reported on 05/19/2024   [provider]  Active Self  EPINEPHrine  0.3 mg/0.3 mL IJ SOAJ injection [id number] Yes Inject 0.3 mg into the muscle once as needed (allergic reaction). [provider]  Active Self  escitalopram  (LEXAPRO ) 20 MG tablet [id number] Yes Take 20 mg by mouth daily. [provider]  Active Self  Fluticasone-Umeclidin-Vilant (TRELEGY ELLIPTA ) 100-62.5-25 MCG/ACT AEPB [id number]  Inhale 1 Inhalation into the lungs daily.  Patient not taking: Reported on 05/19/2024   Ziglar, Susan K, MD  Active Self  furosemide  (LASIX ) 20 MG tablet [id number]  Take 1 tablet (20 mg total) by mouth daily.  Patient not taking: Reported on 05/19/2024   Ziglar, Susan K, MD  Active Self  HYDROcodone -acetaminophen  (NORCO/VICODIN) 5-325 MG tablet [id number]  Take 1 tablet by mouth every 8 (eight) hours as needed for moderate pain (pain score 4-6).  Patient not taking: Reported on 05/19/2024   Jhonny Calvin NOVAK, MD  Active   ibuprofen  (ADVIL ) 800 MG tablet [id number] Yes Take 1 tablet (800 mg total) by  mouth every 8 (eight) hours as needed for mild pain (pain score 1-3) or moderate pain (pain score 4-6). Ziglar, Susan K, MD  Active Self  icosapent  Ethyl (VASCEPA ) 1 g capsule [id number]  Take 4 capsules (4 g total) by mouth 2 (two) times daily.  Patient not  taking: Reported on 05/19/2024   Ziglar, Susan K, MD  Active Self  ipratropium-albuterol  (DUONEB) 0.5-2.5 (3) MG/3ML SOLN [id number] Yes Inhale 3 mLs into the lungs every 6 (six) hours as needed. [provider]  Active Self  levothyroxine  (SYNTHROID ) 150 MCG tablet [id number]  Take 1 tablet (150 mcg total) by mouth daily.  Patient not taking: Reported on 05/19/2024   Ziglar, Susan K, MD  Active Self  methocarbamol  (ROBAXIN ) 750 MG tablet [id number] Yes Take 2 tablets (1,500 mg total) by mouth 3 (three) times daily. Ziglar, Susan K, MD  Active Self  naloxone  (NARCAN ) nasal spray 4 mg/0.1 mL [id number]  Call 911. Administer a single spray in one nostril. If no or minimal response after 2 to 3 minutes, an additional dose may be given in the alternate nostril.  Patient not taking: Reported on 05/19/2024   [provider]  Active Self  nicotine (NICODERM CQ - DOSED IN MG/24 HOURS) 21 mg/24hr patch [id number]  Place onto the skin.  Patient not taking: Reported on 05/19/2024   [provider]  Active Self  nortriptyline  (PAMELOR ) 50 MG capsule [id number] Yes Take 50 mg by mouth at bedtime. [provider]  Active Self  omeprazole  (PRILOSEC) 20 MG capsule [id number] Yes Take 1 capsule (20 mg total) by mouth daily. Ziglar, Susan K, MD  Active Self  promethazine  (PHENERGAN ) 25 MG tablet [id number] Yes Take 1 tablet (25 mg total) by mouth every 6 (six) hours as needed. Jhonny Calvin NOVAK, MD  Active   Spacer/Aero-Holding Chambers (AEROCHAMBER MV) inhaler [id number] Yes Use as instructed Ziglar, Susan K, MD  Active Self  Vitamin D , Ergocalciferol , (DRISDOL ) 1.25 MG (50000 UNIT) CAPS capsule [id number]  Take 1 capsule (50,000 Units total) by mouth every 7 (seven) days.  Patient not taking: Reported on 05/19/2024   Ziglar, Susan K, MD  Active Self           Med Note GAYLENE DARVIN JINNY [place] May 14, 2024 10:36 PM) Pt does not take, because was mistakenly taking 1qd instead of 1qw.              Home Care and Equipment/Supplies: Were Home Health Services Ordered?: No Any new equipment or medical supplies ordered?: No  Functional Questionnaire: Do you need assistance with bathing/showering or dressing?: Yes (sister will help with bathing) Do you need assistance with meal preparation?: No Do you need assistance with eating?: No Do you have difficulty maintaining continence: No Do you need assistance with getting out of bed/getting out of a chair/moving?: No Do you have difficulty managing or taking your medications?: No  Follow up appointments reviewed: PCP Follow-up appointment confirmed?: No (Patient is looking to change PCP) MD Provider Line Number:[phone] Given: Yes Specialist Hospital Follow-up appointment confirmed?: Yes Date of Specialist follow-up appointment?: 05/19/24 (Patient does not have an appointment. Patient is to go to the office as a walk in to have heart monitor placed) Follow-Up Specialty Provider:: Dr. Paraschos for heart monitor placement Do you need transportation to your follow-up appointment?: No Do you understand care options if your condition(s) worsen?: Yes-patient verbalized understanding  SDOH Interventions Today    Flowsheet Row Most Recent Value  SDOH  Interventions   Food Insecurity Interventions Intervention Not Indicated  Housing Interventions Intervention Not Indicated  Transportation Interventions Intervention Not Indicated  Utilities Interventions Intervention Not Indicated    Goals Addressed             This Visit's Progress    VBCI Transitions of Care (TOC) Care Plan       Problems:  Recent Hospitalization for treatment of CVA No Hospital Follow Up Provider appointment Patient prefers to establish care with new PCP  Goal:  Over the next 30 days, the patient will not experience hospital readmission  Interventions:  Transitions of Care: Durable Medical Equipment (DME) reviewed with patient/caregiver Doctor  Visits  - discussed the importance of doctor visits Medication review-patient has 10 prescriptions ready for pick up, planning to take discharge list of medications to the pharmacy today to reconcile with pharmacist Discussed the importance of PCP hospital follow up-patient would like to establish care with new PCP Provided MD referral line [phone]  Stroke: Reviewed Importance of taking all medications as prescribed Reviewed Importance of attending all scheduled provider appointments Advised to report any changes in symptoms or exercise tolerance Assessed social determinant of health barriers Assessed for cognitive impairment Assessed for fall status and safety in the home Reviewed bleeding precautions Discussed the importance of follow up with Cardiology today for heart monitor placement-patient and sister planning to follow up today Discussed Neurology referral-patient aware, has not been contacted to schedule at this time-RNCM to follow up  Patient Self Care Activities:  Attend all scheduled provider appointments Call pharmacy for medication refills 3-7 days in advance of running out of medications Call provider office for new concerns or questions  Notify RN Care Manager of [facility] call rescheduling needs Participate in Transition of Care Program/Attend [facility] scheduled calls Take medications as prescribed    Plan:  Telephone follow up appointment with care management team member scheduled for:  05/26/24 at 10:30am with Laine, RN        Andrea Dimes RN, BSN [place]  [facility] RN Care Manager [phone]

## 2024-05-22 ENCOUNTER — Other Ambulatory Visit: Payer: Self-pay | Admitting: Family Medicine

## 2024-05-22 DIAGNOSIS — M546 Pain in thoracic spine: Secondary | ICD-10-CM

## 2024-05-26 ENCOUNTER — Other Ambulatory Visit: Payer: Self-pay | Admitting: *Deleted

## 2024-05-26 NOTE — Transitions of Care (Post Inpatient/ED Visit) (Signed)
 Transition of Care week 2/ day # 7  Visit Note  05/26/2024  Name: Michelle Conley MRN: [id number]          DOB: October 01, 1971  Situation: Patient enrolled in [facility] 30-day program. Visit completed with patient by telephone.   HIPAA identifiers x 2 verified  Background:  Recent hospitalization July 10-14, 2025 for CVA (1) unplanned hospital admission x last 6/ and 12 months  Initial Transition Care Management Follow-up Telephone Call    Past Medical History:  Diagnosis Date   Abnormal chest xray    Reports that she has spots on her lungs   Anemia    Anxiety    Aortic valve disorder    Aortic valve insufficiency    Asthma    Cervical dysplasia    Complication of anesthesia    reports that she comes out of it slowly   COPD (chronic obstructive pulmonary disease) (HCC)    Cranial nerve palsy    Depression    GERD (gastroesophageal reflux disease)    Goiter    History of kidney stones    History of meningitis    History of viral encephalitis    Hypertension    Hypothyroidism    Migraine headache    Post traumatic stress disorder (PTSD)    Scarlet fever    Shortness of breath dyspnea    Stroke (HCC)    Syncope and collapse    Assessment:  Discussed current clinical condition: I am doing better than I was.  I am going to call today to get an appointment with Dr. Ziglar and with a pain management doctor; I will call myself- I have to check with my sister to she when she can take me around her own appointments.  I am having migraines and my vision is still blurry.  I am using the cane and the walker as needed, but I don't need it all the time.  I am wearing the heart monitor    Denies specific / new clinical concerns and sounds to be in no distress throughout [facility] 30-day program outreach call today  Patient Reported Symptoms: Cognitive Cognitive Status: Normal speech and language skills, Alert and oriented to person, place, and time, Insightful and able to interpret abstract  concepts Cognitive/Intellectual Conditions Management [RPT]: Other Other: History of CVA      Neurological Neurological Review of Symptoms: Headaches, Vision changes (Reports having ongoing migraine headaches and visual changes- blurriness post- recent CVA) Neurological Management Strategies: Coping strategies, Medication therapy, Routine screening Neurological Comment: Verified has neurology provider appointment on 06/25/24: Dr. Lane  HEENT HEENT Symptoms Reported: No symptoms reported      Cardiovascular Cardiovascular Symptoms Reported: No symptoms reported Does patient have uncontrolled Hypertension?: No Cardiovascular Management Strategies: Medication therapy, Routine screening, Coping strategies, Medical device (confirmed is currently wearing cardiac event monitor at home)  Respiratory Respiratory Symptoms Reported: No symptoms reported Other Respiratory Symptoms: Denies cough/ shortness of breath- sounds to be in no respiratory distress throughout TOC call Additional Respiratory Details: history asthma; COPD- does not require home oxygen Respiratory Management Strategies: Activity, Asthma action plan, Medication therapy, Routine screening, CPAP  Endocrine Endocrine Symptoms Reported: No symptoms reported Is patient diabetic?: No    Gastrointestinal Gastrointestinal Symptoms Reported: No symptoms reported Gastrointestinal Management Strategies: Coping strategies    Genitourinary Genitourinary Symptoms Reported: No symptoms reported    Integumentary Integumentary Symptoms Reported: No symptoms reported    Musculoskeletal Musculoskelatal Symptoms Reviewed: Limited mobility, Unsteady gait Additional Musculoskeletal Details: Confirmed  continues to use cane/ walker prn Musculoskeletal Management Strategies: Routine screening, Medical device, Adequate rest      Psychosocial Psychosocial Symptoms Reported: No symptoms reported         There were no vitals filed for this  visit.  Medications Reviewed Today     Reviewed by Jacqualin Shirkey M, RN (Registered Nurse) on 05/26/24 at 1031  Med List Status: <None>   Medication Order Taking? Sig Documenting Provider Last Dose Status Informant  acetaminophen  (TYLENOL ) 500 MG tablet [id number]  Take 500 mg by mouth every 8 (eight) hours as needed for headache, mild pain (pain score 1-3) or fever.  Patient not taking: Reported on 05/19/2024   [provider]  Active Self  albuterol  (VENTOLIN  HFA) 108 (90 Base) MCG/ACT inhaler [id number]  Inhale 2 puffs into the lungs every 6 (six) hours as needed for wheezing or shortness of breath. Ziglar, Susan K, MD  Active Self  alprazolam  (XANAX ) 2 MG tablet [id number]  Take 2 mg by mouth 4 (four) times daily. [provider]  Active Self  aspirin  EC 81 MG tablet [id number]  Take 1 tablet (81 mg total) by mouth daily. Swallow whole.  Patient not taking: Reported on 05/19/2024   Jhonny Calvin NOVAK, MD  Active   atorvastatin  (LIPITOR) 80 MG tablet [id number]  Take 1 tablet (80 mg total) by mouth daily. Ziglar, Susan K, MD  Active Self  clopidogrel  (PLAVIX ) 75 MG tablet [id number]  Take 1 tablet (75 mg total) by mouth daily for 21 days.  Patient not taking: Reported on 05/19/2024   Jhonny Calvin NOVAK, MD  Active   DUPIXENT 300 MG/2ML EMMANUEL [id number]  Inject into the skin.  Patient not taking: Reported on 05/19/2024   [provider]  Active Self  EPINEPHrine  0.3 mg/0.3 mL IJ SOAJ injection [id number]  Inject 0.3 mg into the muscle once as needed (allergic reaction). [provider]  Active Self  escitalopram  (LEXAPRO ) 20 MG tablet [id number]  Take 20 mg by mouth daily. [provider]  Active Self  Fluticasone-Umeclidin-Vilant (TRELEGY ELLIPTA ) 100-62.5-25 MCG/ACT AEPB [id number]  Inhale 1 Inhalation into the lungs daily.  Patient not taking: Reported on 05/19/2024   Ziglar, Susan K, MD  Active Self  furosemide  (LASIX ) 20 MG tablet [id number]  Take 1  tablet (20 mg total) by mouth daily.  Patient not taking: Reported on 05/19/2024   Ziglar, Susan K, MD  Active Self  HYDROcodone -acetaminophen  (NORCO/VICODIN) 5-325 MG tablet [id number]  Take 1 tablet by mouth every 8 (eight) hours as needed for moderate pain (pain score 4-6).  Patient not taking: Reported on 05/19/2024   Jhonny Calvin NOVAK, MD  Active   ibuprofen  (ADVIL ) 800 MG tablet [id number]  Take 1 tablet (800 mg total) by mouth every 8 (eight) hours as needed for mild pain (pain score 1-3) or moderate pain (pain score 4-6). Ziglar, Susan K, MD  Active Self  icosapent  Ethyl (VASCEPA ) 1 g capsule [id number]  Take 4 capsules (4 g total) by mouth 2 (two) times daily.  Patient not taking: Reported on 05/19/2024   Ziglar, Susan K, MD  Active Self  ipratropium-albuterol  (DUONEB) 0.5-2.5 (3) MG/3ML SOLN [id number]  Inhale 3 mLs into the lungs every 6 (six) hours as needed. [provider]  Active Self  levothyroxine  (SYNTHROID ) 150 MCG tablet [id number]  Take 1 tablet (150 mcg total) by mouth daily.  Patient not taking: Reported on 05/19/2024   Ziglar, Susan K, MD  Active Self  methocarbamol  (ROBAXIN ) 750  MG tablet [id number]  Take 2 tablets (1,500 mg total) by mouth 3 (three) times daily. Ziglar, Susan K, MD  Active Self  naloxone  (NARCAN ) nasal spray 4 mg/0.1 mL [id number]  Call 911. Administer a single spray in one nostril. If no or minimal response after 2 to 3 minutes, an additional dose may be given in the alternate nostril.  Patient not taking: Reported on 05/19/2024   [provider]  Active Self  nicotine (NICODERM CQ - DOSED IN MG/24 HOURS) 21 mg/24hr patch [id number]  Place onto the skin.  Patient not taking: Reported on 05/19/2024   [provider]  Active Self  nortriptyline  (PAMELOR ) 50 MG capsule [id number]  Take 50 mg by mouth at bedtime. [provider]  Active Self  omeprazole  (PRILOSEC) 20 MG capsule [id number]  Take 1 capsule (20 mg total) by mouth daily.  Ziglar, Susan K, MD  Active Self  promethazine  (PHENERGAN ) 25 MG tablet [id number]  Take 1 tablet (25 mg total) by mouth every 6 (six) hours as needed. Jhonny Calvin NOVAK, MD  Active   Spacer/Aero-Holding Chambers (AEROCHAMBER MV) inhaler [id number]  Use as instructed Ziglar, Susan K, MD  Active Self  Vitamin D , Ergocalciferol , (DRISDOL ) 1.25 MG (50000 UNIT) CAPS capsule [id number]  Take 1 capsule (50,000 Units total) by mouth every 7 (seven) days.  Patient not taking: Reported on 05/19/2024   Ziglar, Susan K, MD  Active Self           Med Note GAYLENE DARVIN JINNY [place] May 14, 2024 10:36 PM) Pt does not take, because was mistakenly taking 1qd instead of 1qw.            Recommendation:   PCP Follow-up Specialty provider follow-up as scheduled: neurology and cardiology Continue Current Plan of Care  Follow Up Plan:   Telephone follow-up in 1 week- as scheduled 06/02/24  Pls call/ message for questions,  Michelle Pecora Mckinney Jaylise Peek, RN, BSN, CCRN Alumnus RN Care Manager  Transitions of Care  [facility] [phone]: direct office

## 2024-05-26 NOTE — Patient Instructions (Signed)
 Visit Information  Thank you for taking time to visit with me today. Please don't hesitate to contact me if I can be of assistance to you before our next scheduled telephone appointment.  Our next appointment is by telephone on Tuesday 06/02/24 at 10:30 am  Please call the care guide team at [phone] if you need to cancel or reschedule your appointment.   Following are the goals we discussed today:  Patient Self Care Activities:  Attend all scheduled provider appointments Call pharmacy for medication refills 3-7 days in advance of running out of medications Call provider office for new concerns or questions  Participate in Transition of Care Program/Attend TOC scheduled calls Take medications as prescribed   Use assistive devices as needed to prevent falls Please call your existing PCP to make an appointment for hospital follow up office visit; please call the pain management clinic as per your stated plans to schedule an appointment and become established with a pain management specialist doctor  If you are experiencing a Mental Health or Behavioral Health Crisis or need someone to talk to, please  call the Suicide and Crisis Lifeline: 988 call the [place]  National Suicide Prevention Lifeline: 813-567-8854 or TTY: 317-508-2792 TTY 631 016 6936) to talk to a trained counselor call 1-800-273-TALK (toll free, 24 hour hotline) go to [facility] [address], [place] [phone]) call the [facility] Crisis Line: [phone] call 911   Patient verbalizes understanding of instructions and care plan provided today and agrees to view in MyChart. Active MyChart status and patient understanding of how to access instructions and care plan via MyChart confirmed with patient.     Pls call/ message for questions,  Itzel Mckibbin Mckinney Beauden Tremont, RN, BSN, CCRN Alumnus RN Care Manager  Transitions of Care  [facility] - [facility] [phone]: direct office

## 2024-06-02 ENCOUNTER — Encounter: Payer: Self-pay | Admitting: Family Medicine

## 2024-06-02 ENCOUNTER — Telehealth: Payer: Self-pay | Admitting: *Deleted

## 2024-06-02 ENCOUNTER — Ambulatory Visit (INDEPENDENT_AMBULATORY_CARE_PROVIDER_SITE_OTHER): Admitting: Family Medicine

## 2024-06-02 ENCOUNTER — Encounter: Payer: Self-pay | Admitting: *Deleted

## 2024-06-02 VITALS — BP 123/77 | HR 94 | Temp 98.7°F | Resp 18 | Ht <= 58 in | Wt 155.0 lb

## 2024-06-02 DIAGNOSIS — I10 Essential (primary) hypertension: Secondary | ICD-10-CM

## 2024-06-02 DIAGNOSIS — K219 Gastro-esophageal reflux disease without esophagitis: Secondary | ICD-10-CM | POA: Diagnosis not present

## 2024-06-02 DIAGNOSIS — F419 Anxiety disorder, unspecified: Secondary | ICD-10-CM

## 2024-06-02 DIAGNOSIS — J418 Mixed simple and mucopurulent chronic bronchitis: Secondary | ICD-10-CM

## 2024-06-02 DIAGNOSIS — G43711 Chronic migraine without aura, intractable, with status migrainosus: Secondary | ICD-10-CM

## 2024-06-02 MED ORDER — FUROSEMIDE 20 MG PO TABS: 20.0000 mg | ORAL_TABLET | Freq: Every day | ORAL | 1 refills | Status: DC

## 2024-06-02 MED ORDER — OMEPRAZOLE 20 MG PO CPDR: 20.0000 mg | DELAYED_RELEASE_CAPSULE | Freq: Every day | ORAL | 0 refills | Status: DC

## 2024-06-02 MED ORDER — ALPRAZOLAM 2 MG PO TABS
2.0000 mg | ORAL_TABLET | Freq: Four times a day (QID) | ORAL | 0 refills | Status: DC
Start: 2024-06-02 — End: 2024-06-02

## 2024-06-02 MED ORDER — ALBUTEROL SULFATE HFA 108 (90 BASE) MCG/ACT IN AERS: 2.0000 | INHALATION_SPRAY | Freq: Four times a day (QID) | RESPIRATORY_TRACT | 0 refills | Status: DC | PRN

## 2024-06-02 MED ORDER — NORTRIPTYLINE HCL 50 MG PO CAPS: 50.0000 mg | ORAL_CAPSULE | Freq: Every day | ORAL | 1 refills | Status: AC

## 2024-06-02 NOTE — Progress Notes (Signed)
 Established Patient Office Visit  Subjective   Patient ID: Michelle Conley, female    DOB: Dec 13, 1970  Age: 53 y.o. MRN: [id number]  Chief Complaint  Patient presents with  . Hospitalization Follow-up  . Cerebrovascular Accident    stroke    Cerebrovascular Accident  Delightful 53 year old with HTN, hypothyroidism, aortic insufficiency (Dr. Lucyann), COPD (Dr. Parris), chronic headaches (Dr. Fayette), anxiety, adenomatous colon polyps (07/2023, repeat in 5 years), nicotine addiction, anxiety and depression (Dr. Beverley), CVA, s/p hysterectomy and s/p choleycystectomy.   Discussed the use of AI scribe software for clinical note transcription with the patient, who gave verbal consent to proceed.  History of Present Illness   Michelle Conley is a 53 year old female with a history of stroke who presents for hospital follow-up [facility] 7/10-7/14).  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  She is accompanied by her sister, Lebron Gaudier, who is a retired engineer, civil (consulting) and caregiver.  She has persistent double vision and blurred vision following a recent stroke. She reports that her right eye can now open halfway, an improvement from two weeks ago. She has a history of previous strokes affecting her cranial nerves, specifically the third, seventh, and eleventh nerves, and currently, the third and sixth cranial nerves are involved. She is not driving due to her double vision issues.  She recent stroke resulted from brain damage in the pontine area.  She had MRI and MRA of the brain and had a focal T2/FLAIR hyperintensity within the right paramedian pons.  This is new and may represent a remote infarct.  She has a history of viral encephalitis and previous stroke, which have caused significant health challenges.  She had a cardiac  echo 05/17/2024: LVEF 50 to 55%.  The left ventricle has low normal function.  No RWMA.  Mild concentric left ventricular hypertrophy, grade 1 diastolic dysfunction.  Right ventricular systolic function is normal, right ventricular size is normal.  No valve dysfunction.  She continues to experience headaches, described as 'eighteen wheelers sitting on my head', and takes ibuprofen  two to three times a day to manage them. She also takes nortriptyline  BID as a preventative measure. She has a history of severe headaches since childhood, exacerbated by a head injury from a glass ashtray.  She is currently on Aspirin  and Plavix  for 3 weeks then she will stop the plavix  but continue the 81 mg ASA.  She is also on atorvastatin  80 mg daily.  She has a history of smoking and is attempting to cut down. She is currently smoking about a pack a day.  She wears a Zio patch for heart monitoring due to concerns about her heart rhythm as a potential stroke risk factor. She is to wear this monitor for 4 weeks.    She experiences nausea and vomiting, which have improved but still require promethazine , especially after a complicated gallbladder surgery. She has a history of severe motion sickness and car sickness.  Her family history includes strokes and aneurysms, particularly on her mother's side. Her sister, Lebron, assists with her care and monitors her health closely.      She has a follow-up with neurology on 8 2 concerning her stroke and she has a follow-up with neurology, Dr. Lane 06/25/2024 for her migraine headaches.     ROS  Objective:     BP 123/77 (BP Location: Left Arm, Patient Position: Sitting, Cuff Size: Normal)   Pulse 94   Temp 98.7 F (37.1 C) (Oral)   Resp 18   Ht 4' 10 (1.473 m)   Wt 155 lb (70.3 kg)   LMP 06/02/2015   SpO2 91%   BMI 32.40 kg/m    Physical Exam Vitals and nursing note reviewed.  Constitutional:      Appearance: Normal appearance.  HENT:     Head:  Normocephalic and atraumatic.  Eyes:     Conjunctiva/sclera: Conjunctivae normal.  Cardiovascular:     Rate and Rhythm: Normal rate and regular rhythm.  Pulmonary:     Effort: Pulmonary effort is normal.     Breath sounds: Normal breath sounds.  Musculoskeletal:     Right lower leg: No edema.     Left lower leg: No edema.  Skin:    General: Skin is warm and dry.  Neurological:     Mental Status: She is alert and oriented to person, place, and time.  Psychiatric:        Mood and Affect: Mood normal.        Behavior: Behavior normal.        Thought Content: Thought content normal.        Judgment: Judgment normal.          No results found for any visits on 06/02/24.    The ASCVD Risk score (Arnett DK, et al., 2019) failed to calculate for the following reasons:   Risk score cannot be calculated because patient has a medical history suggesting prior/existing ASCVD    Assessment & Plan:  Anxiety -     ALPRAZolam ; Take 1 tablet (2 mg total) by mouth 4 (four) times daily.  Dispense: 120 tablet; Refill: 0  Mixed simple and mucopurulent chronic bronchitis (HCC) -     Albuterol  Sulfate HFA; Inhale 2 puffs into the lungs every 6 (six) hours as needed for wheezing or shortness of breath.  Dispense: 1 each; Refill: 0  Benign essential hypertension -     Furosemide ; Take 1 tablet (20 mg total) by mouth daily.  Dispense: 30 tablet; Refill: 1  Gastroesophageal reflux disease, unspecified whether esophagitis present -     Omeprazole ; Take 1 capsule (20 mg total) by mouth daily.  Dispense: 90 capsule; Refill: 0  Intractable chronic migraine without aura and with status migrainosus -     Nortriptyline  HCl; Take 1 capsule (50 mg total) by mouth at bedtime.  Dispense: 90 capsule; Refill: 1     Return in about 6 weeks (around 07/14/2024).    Wendolyn Raso K Kaeya Schiffer, MD

## 2024-06-03 ENCOUNTER — Other Ambulatory Visit: Payer: Self-pay | Admitting: *Deleted

## 2024-06-03 NOTE — Transitions of Care (Post Inpatient/ED Visit) (Signed)
 Transition of Care week 3/ day # 15  Visit Note  06/03/2024  Name: Michelle Conley MRN: [id number]          DOB: February 27, 1971  Situation: Patient enrolled in [facility] 30-day program. Visit completed with patient by telephone.   HIPAA identifiers x 2 verified  Background:  Recent hospitalization July 10-14, 2025 for CVA (1) unplanned hospital admission x last 6/ and 12 months  Initial Transition Care Management Follow-up Telephone Call    Past Medical History:  Diagnosis Date   Abnormal chest xray    Reports that she has spots on her lungs   Anemia    Anxiety    Aortic valve disorder    Aortic valve insufficiency    Asthma    Cervical dysplasia    Complication of anesthesia    reports that she comes out of it slowly   COPD (chronic obstructive pulmonary disease) (HCC)    Cranial nerve palsy    Depression    GERD (gastroesophageal reflux disease)    Goiter    History of kidney stones    History of meningitis    History of viral encephalitis    Hypertension    Hypothyroidism    Migraine headache    Post traumatic stress disorder (PTSD)    Scarlet fever    Shortness of breath dyspnea    Stroke (HCC)    Syncope and collapse    Assessment:  I am still doing better than I was.  I had a very good visit with Dr. Ziglar yesterday; she overall said things looked okay.  Still having the migraines and the blurred vision, but hopefully that will continue to improve.  Still wearing the heart monitor, will wear it until 06/19/24.  My sister is a retired Lawyer- she watched everything I do and is always here with me if I need anything.  No longer using the cane or the walker.    Denies specific clinical concerns and sounds to be in no distress throughout [facility] 30-day program outreach call today  Patient Reported Symptoms: Cognitive Cognitive Status: Normal speech and language skills, Alert and oriented to person, place, and time, Insightful and able to interpret abstract concepts       Neurological Neurological Review of Symptoms: Headaches, Vision changes, Other: Oher Neurological Symptoms/Conditions [RPT]: Confirmed continues having migraine headaches and visual blurriness as per baseline post-hospital discharge for CVA- confirmed she discussed this with PCP at time of hospital follow up office 06/03/24 Neurological Management Strategies: Coping strategies, Medication therapy, Routine screening  HEENT HEENT Symptoms Reported: No symptoms reported      Cardiovascular Cardiovascular Symptoms Reported: No symptoms reported, Other: Other Cardiovascular Symptoms: Confirmed continues to wear home heart monitor: reports will wear until 06/19/24 and then has appointment with cardiology provider on Does patient have uncontrolled Hypertension?: No Cardiovascular Management Strategies: Medication therapy, Routine screening, Coping strategies, Medical device  Respiratory Respiratory Symptoms Reported: No symptoms reported Other Respiratory Symptoms: Denies cough; shortness of breath- sounds to be in no respiratory distress throughout [facility] call    Endocrine Endocrine Symptoms Reported: No symptoms reported Is patient diabetic?: No    Gastrointestinal Gastrointestinal Symptoms Reported: No symptoms reported Additional Gastrointestinal Details: Reports good appetite confirms having normal BM's on a regular basis; denies constipation Gastrointestinal Management Strategies: Coping strategies    Genitourinary Genitourinary Symptoms Reported: No symptoms reported Additional Genitourinary Details: Reports peeing normally in good amounts, it is clear yellow urine  I try to stay well hydrated  Integumentary Integumentary Symptoms Reported: No symptoms reported    Musculoskeletal Musculoskelatal Symptoms Reviewed: No symptoms reported Additional Musculoskeletal Details: confirmed not currently requiring/ using assistive devices for ambulation Musculoskeletal Management Strategies:  Routine screening, Coping strategies      Psychosocial Psychosocial Symptoms Reported: Depression - if selected complete PHQ 2-9 Behavioral Management Strategies: Coping strategies, Medication therapy Behavioral Health Comment: confirmed attended established psychiatry provider office visit on 05/28/24 Major Change/Loss/Stressor/Fears (CP): Death of a loved one Techniques to Cope with Loss/Stress/Change: Diversional activities, Medication, Counseling Quality of Family Relationships: supportive, helpful   There were no vitals filed for this visit.  Medications Reviewed Today     Reviewed by Yaeko Fazekas M, RN (Registered Nurse) on 06/03/24 at 1416  Med List Status: <None>   Medication Order Taking? Sig Documenting Provider Last Dose Status Informant  acetaminophen  (TYLENOL ) 500 MG tablet [id number]  Take 500 mg by mouth every 8 (eight) hours as needed for headache, mild pain (pain score 1-3) or fever.  Patient not taking: Reported on 06/02/2024   [provider]  Active Self  albuterol  (VENTOLIN  HFA) 108 (90 Base) MCG/ACT inhaler [id number]  Inhale 2 puffs into the lungs every 6 (six) hours as needed for wheezing or shortness of breath. Ziglar, Susan K, MD  Active   aspirin  EC 81 MG tablet [id number]  Take 1 tablet (81 mg total) by mouth daily. Swallow whole. Jhonny Calvin NOVAK, MD  Active   atorvastatin  (LIPITOR) 80 MG tablet [id number]  Take 1 tablet (80 mg total) by mouth daily. Ziglar, Susan K, MD  Active Self  clopidogrel  (PLAVIX ) 75 MG tablet [id number]  Take 1 tablet (75 mg total) by mouth daily for 21 days. Jhonny Calvin NOVAK, MD  Active   DUPIXENT 300 MG/2ML EMMANUEL [id number]  Inject into the skin.  Patient not taking: Reported on 06/02/2024   [provider]  Active Self  EPINEPHrine  0.3 mg/0.3 mL IJ SOAJ injection [id number]  Inject 0.3 mg into the muscle once as needed (allergic reaction). [provider]  Active Self  escitalopram  (LEXAPRO ) 20 MG tablet  [id number]  Take 20 mg by mouth daily. [provider]  Active Self  Fluticasone-Umeclidin-Vilant (TRELEGY ELLIPTA ) 100-62.5-25 MCG/ACT AEPB [id number]  Inhale 1 Inhalation into the lungs daily.  Patient not taking: Reported on 06/02/2024   Ziglar, Susan K, MD  Active Self  furosemide  (LASIX ) 20 MG tablet [id number]  Take 1 tablet (20 mg total) by mouth daily. Ziglar, Susan K, MD  Active   HYDROcodone -acetaminophen  (NORCO/VICODIN) 5-325 MG tablet [id number]  Take 1 tablet by mouth every 8 (eight) hours as needed for moderate pain (pain score 4-6). Jhonny Calvin NOVAK, MD  Active   ibuprofen  (ADVIL ) 800 MG tablet [id number]  Take 1 tablet (800 mg total) by mouth every 8 (eight) hours as needed for mild pain (pain score 1-3) or moderate pain (pain score 4-6). Ziglar, Susan K, MD  Active Self  icosapent  Ethyl (VASCEPA ) 1 g capsule [id number]  Take 4 capsules (4 g total) by mouth 2 (two) times daily. Ziglar, Susan K, MD  Active Self  ipratropium-albuterol  (DUONEB) 0.5-2.5 (3) MG/3ML SOLN [id number]  Inhale 3 mLs into the lungs every 6 (six) hours as needed. [provider]  Active Self  levothyroxine  (SYNTHROID ) 150 MCG tablet [id number]  Take 1 tablet (150 mcg total) by mouth daily. Ziglar, Susan K, MD  Active Self  methocarbamol  (ROBAXIN ) 750 MG tablet [id number]  Take 2 tablets (1,500 mg total) by mouth 3 (three) times daily.  Ziglar, Susan K, MD  Active Self  naloxone  (NARCAN ) nasal spray 4 mg/0.1 mL [id number]  Call 911. Administer a single spray in one nostril. If no or minimal response after 2 to 3 minutes, an additional dose may be given in the alternate nostril. [provider]  Active Self  nicotine (NICODERM CQ - DOSED IN MG/24 HOURS) 21 mg/24hr patch [id number]  Place onto the skin.  Patient not taking: Reported on 06/02/2024   [provider]  Active Self  nortriptyline  (PAMELOR ) 50 MG capsule [id number]  Take 1 capsule (50 mg total) by mouth at bedtime. Ziglar, Susan K,  MD  Active   omeprazole  (PRILOSEC) 20 MG capsule [id number]  Take 1 capsule (20 mg total) by mouth daily. Ziglar, Susan K, MD  Active   promethazine  (PHENERGAN ) 25 MG tablet [id number]  Take 1 tablet (25 mg total) by mouth every 6 (six) hours as needed. Jhonny Calvin NOVAK, MD  Active   Spacer/Aero-Holding Chambers (AEROCHAMBER MV) inhaler [id number]  Use as instructed Ziglar, Susan K, MD  Active Self  Vitamin D , Ergocalciferol , (DRISDOL ) 1.25 MG (50000 UNIT) CAPS capsule [id number]  Take 1 capsule (50,000 Units total) by mouth every 7 (seven) days.  Patient not taking: Reported on 06/02/2024   Ziglar, Susan K, MD  Active Self           Med Note GAYLENE DARVIN JINNY [place] May 14, 2024 10:36 PM) Pt does not take, because was mistakenly taking 1qd instead of 1qw.            Recommendation:   Specialty provider follow-up as scheduled: cardiology, neurology, PCP Continue Current Plan of Care  Follow Up Plan:   Telephone follow-up in 1 week- as scheduled 06/12/24  Pls call/ message for questions,  Valeda Corzine Mckinney Cyril Woodmansee, RN, BSN, CCRN Alumnus RN Care Manager  Transitions of Care  [facility] - [facility] [phone]: direct office

## 2024-06-03 NOTE — Patient Instructions (Signed)
 Visit Information  Thank you for taking time to visit with me today. Please don't hesitate to contact me if I can be of assistance to you before our next scheduled telephone appointment.  Our next appointment is by telephone on Friday June 12, 2024 at 10:30 am  Please call the care guide team at [phone] if you need to cancel or reschedule your appointment.   Following are the goals we discussed today:  Patient Self Care Activities:  Attend all scheduled provider appointments Call pharmacy for medication refills 3-7 days in advance of running out of medications Call provider office for new concerns or questions  Participate in Transition of Care Program/Attend TOC scheduled calls Take medications as prescribed   Use assistive devices as needed to prevent falls- your cane and walker Please call the pain management clinic as per your stated plans to schedule an appointment and become established with a pain management specialist doctor  If you are experiencing a Mental Health or Behavioral Health Crisis or need someone to talk to, please  call the Suicide and Crisis Lifeline: 988 call the [place]  National Suicide Prevention Lifeline: (410) 114-9950 or TTY: 850-149-5885 TTY (984) 039-1250) to talk to a trained counselor call 1-800-273-TALK (toll free, 24 hour hotline) go to [facility] [address] [place] [phone]) call the [facility] Crisis Line: [phone] call 911   Patient verbalizes understanding of instructions and care plan provided today and agrees to view in MyChart. Active MyChart status and patient understanding of how to access instructions and care plan via MyChart confirmed with patient.     Shacara Cozine Mckinney Peony Barner, RN, BSN, Media planner  Transitions of Care  [facility] - [facility] [phone]: direct office

## 2024-06-04 ENCOUNTER — Encounter (HOSPITAL_BASED_OUTPATIENT_CLINIC_OR_DEPARTMENT_OTHER): Payer: Self-pay

## 2024-06-11 ENCOUNTER — Telehealth: Payer: Self-pay | Admitting: Family Medicine

## 2024-06-11 NOTE — Telephone Encounter (Unsigned)
 Copied from CRM #[id number]. Topic: Clinical - Medication Question >> Jun 11, 2024 12:01 PM Tiffini S wrote: Reason for CRM: Patient called about medication prescriptions that have been cancelled that she has been on for years that helps her- about seven medications she was taken off of: promethazine  25mg , alatraz/ she have been in the hospital for a stroke/ cannot see to drive and is out of medication Please called the patient back at [phone] about medication being cancelled with pharmacy.

## 2024-06-11 NOTE — Telephone Encounter (Signed)
 Called patient and was on phone call 22 minutes as she was upset that her anxiety medications were cancelled. She said the pharmacy told her Dr. Ziglar called them and asked that they DO NOT FILL Xanax . I have notified the provider who will reach out to this patient when she returns to the office 06/12/2024.

## 2024-06-12 ENCOUNTER — Telehealth: Payer: Self-pay | Admitting: Family Medicine

## 2024-06-12 ENCOUNTER — Other Ambulatory Visit: Payer: Self-pay | Admitting: *Deleted

## 2024-06-12 ENCOUNTER — Other Ambulatory Visit: Payer: Self-pay | Admitting: Family Medicine

## 2024-06-12 DIAGNOSIS — F419 Anxiety disorder, unspecified: Secondary | ICD-10-CM

## 2024-06-12 MED ORDER — ALPRAZOLAM 2 MG PO TABS: ORAL_TABLET | ORAL | 1 refills | Status: AC

## 2024-06-12 NOTE — Telephone Encounter (Signed)
 Called patient and advised that I filled her xanax  prescription and she can ick thi sup on 06/17/2024.

## 2024-06-12 NOTE — Transitions of Care (Post Inpatient/ED Visit) (Signed)
 Transition of Care week 4/ day # 24  Visit Note  06/12/2024  Name: Michelle Conley MRN: [id number]          DOB: [birth year]  Situation: Patient enrolled in [facility] 30-day program. Visit completed with patient by telephone.   HIPAA identifiers x 2 verified  Background:  Recent hospitalization July 10-14, 2025 for CVA (1) unplanned hospital admission x last 6/ and 12 months  Initial Transition Care Management Follow-up Telephone Call    Past Medical History:  Diagnosis Date   Abnormal chest xray    Reports that she has spots on her lungs   Anemia    Anxiety    Aortic valve disorder    Aortic valve insufficiency    Asthma    Cervical dysplasia    Complication of anesthesia    reports that she comes out of it slowly   COPD (chronic obstructive pulmonary disease) (HCC)    Cranial nerve palsy    Depression    GERD (gastroesophageal reflux disease)    Goiter    History of kidney stones    History of meningitis    History of viral encephalitis    Hypertension    Hypothyroidism    Migraine headache    Post traumatic stress disorder (PTSD)    Scarlet fever    Shortness of breath dyspnea    Stroke (HCC)    Syncope and collapse    Assessment:  I am doing okay- just got off the phone with Dr. Ziglar and she made sure I have the medications I need- she went the extra mile for me; she is a great doctor.  Everything is straightened out now.  The pharmacy was telling me the xanax  was blocked from being re-filled- Dr. Ziglar took care of that right away, so no issues now.  My vision is still not good- still blurry; still have on and off migraines- I will see the neurology doctor soon on 06/25/24; so really I am about the same, but managing well; my sister is still helping me with anything I need    Denies specific clinical concerns and sounds to be in no distress throughout [facility] 30-day program outreach call today  Patient Reported Symptoms:   Cognitive Cognitive Status: Normal speech  and language skills, Alert and oriented to person, place, and time, Insightful and able to interpret abstract concepts Cognitive/Intellectual Conditions Management [RPT]: Other Other: History of CVA      Neurological Neurological Review of Symptoms: Headaches, Vision changes Oher Neurological Symptoms/Conditions [RPT]: Confirmed no changes to ongoing migraines or vision: confirms spoke with her PCP this morning re: plan of care/ medication needs; verified plans to attend neurology provider office visit as scheduled 06/25/24- [facility]) Neurological Management Strategies: Coping strategies, Medication therapy, Routine screening  HEENT HEENT Symptoms Reported: No symptoms reported      Cardiovascular Cardiovascular Symptoms Reported: No symptoms reported Other Cardiovascular Symptoms: Confirmed continues to wear home heart monitor- confirms to wear until 06/19/24- cardiology appointment scheduled 07/29/24 Does patient have uncontrolled Hypertension?: No Cardiovascular Management Strategies: Medication therapy, Routine screening, Coping strategies  Respiratory Respiratory Symptoms Reported: No symptoms reported Other Respiratory Symptoms: Denies shortness of breath/ cough; sounds to be in no respiratory distress throughout [facility] call today Respiratory Management Strategies: Asthma action plan, Adequate rest, Routine screening, CPAP  Endocrine Endocrine Symptoms Reported: No symptoms reported Is patient diabetic?: No    Gastrointestinal Gastrointestinal Symptoms Reported: No symptoms reported Additional Gastrointestinal Details: Continues to report good  appetite and normal and regular BM's Gastrointestinal Management Strategies: Coping strategies    Genitourinary Genitourinary Symptoms Reported: No symptoms reported    Integumentary Integumentary Symptoms Reported: No symptoms reported    Musculoskeletal Musculoskelatal Symptoms Reviewed: No symptoms reported         Psychosocial Psychosocial Symptoms Reported: No symptoms reported Behavioral Management Strategies: Medication therapy, Counseling, Coping strategies, Support system Techniques to Cope with Loss/Stress/Change: Diversional activities, Counseling, Medication Quality of Family Relationships: helpful, involved, supportive   There were no vitals filed for this visit.  Medications Reviewed Today     Reviewed by Regino Fournet M, RN (Registered Nurse) on 06/12/24 at 1033  Med List Status: <None>   Medication Order Taking? Sig Documenting Provider Last Dose Status Informant  acetaminophen  (TYLENOL ) 500 MG tablet [id number]  Take 500 mg by mouth every 8 (eight) hours as needed for headache, mild pain (pain score 1-3) or fever.  Patient not taking: Reported on 06/02/2024   [provider]  Active Self  albuterol  (VENTOLIN  HFA) 108 (90 Base) MCG/ACT inhaler [id number]  Inhale 2 puffs into the lungs every 6 (six) hours as needed for wheezing or shortness of breath. Ziglar, Susan K, MD  Active   alprazolam  (XANAX ) 2 MG tablet [id number]  1 po QID PRN anxiety Ziglar, Susan K, MD  Active   aspirin  EC 81 MG tablet [id number]  Take 1 tablet (81 mg total) by mouth daily. Swallow whole. Jhonny Calvin NOVAK, MD  Active   atorvastatin  (LIPITOR) 80 MG tablet [id number]  Take 1 tablet (80 mg total) by mouth daily. Ziglar, Susan K, MD  Active Self  DUPIXENT 300 MG/2ML EMMANUEL [id number]  Inject into the skin.  Patient not taking: Reported on 06/02/2024   [provider]  Active Self  EPINEPHrine  0.3 mg/0.3 mL IJ SOAJ injection [id number]  Inject 0.3 mg into the muscle once as needed (allergic reaction). [provider]  Active Self  escitalopram  (LEXAPRO ) 20 MG tablet [id number]  Take 20 mg by mouth daily. [provider]  Active Self  Fluticasone-Umeclidin-Vilant (TRELEGY ELLIPTA ) 100-62.5-25 MCG/ACT AEPB [id number]  Inhale 1 Inhalation into the lungs daily.  Patient not taking: Reported on  06/02/2024   Ziglar, Susan K, MD  Active Self  furosemide  (LASIX ) 20 MG tablet [id number]  Take 1 tablet (20 mg total) by mouth daily. Ziglar, Susan K, MD  Active   HYDROcodone -acetaminophen  (NORCO/VICODIN) 5-325 MG tablet [id number]  Take 1 tablet by mouth every 8 (eight) hours as needed for moderate pain (pain score 4-6). Jhonny Calvin NOVAK, MD  Active   ibuprofen  (ADVIL ) 800 MG tablet [id number]  Take 1 tablet (800 mg total) by mouth every 8 (eight) hours as needed for mild pain (pain score 1-3) or moderate pain (pain score 4-6). Ziglar, Susan K, MD  Active Self  icosapent  Ethyl (VASCEPA ) 1 g capsule [id number]  Take 4 capsules (4 g total) by mouth 2 (two) times daily. Ziglar, Susan K, MD  Active Self  ipratropium-albuterol  (DUONEB) 0.5-2.5 (3) MG/3ML SOLN [id number]  Inhale 3 mLs into the lungs every 6 (six) hours as needed. [provider]  Active Self  levothyroxine  (SYNTHROID ) 150 MCG tablet [id number]  Take 1 tablet (150 mcg total) by mouth daily. Ziglar, Susan K, MD  Active Self  methocarbamol  (ROBAXIN ) 750 MG tablet [id number]  Take 2 tablets (1,500 mg total) by mouth 3 (three) times daily. Ziglar, Susan K, MD  Active Self  naloxone  (NARCAN ) nasal spray 4 mg/0.1 mL [id number]  Call 911. Administer  a single spray in one nostril. If no or minimal response after 2 to 3 minutes, an additional dose may be given in the alternate nostril. [provider]  Active Self  nicotine (NICODERM CQ - DOSED IN MG/24 HOURS) 21 mg/24hr patch [id number]  Place onto the skin.  Patient not taking: Reported on 06/02/2024   [provider]  Active Self  nortriptyline  (PAMELOR ) 50 MG capsule [id number]  Take 1 capsule (50 mg total) by mouth at bedtime. Ziglar, Susan K, MD  Active   omeprazole  (PRILOSEC) 20 MG capsule [id number]  Take 1 capsule (20 mg total) by mouth daily. Ziglar, Susan K, MD  Active   promethazine  (PHENERGAN ) 25 MG tablet [id number]  Take 1 tablet (25 mg total) by mouth every 6 (six)  hours as needed. Jhonny Calvin NOVAK, MD  Active   Spacer/Aero-Holding Chambers (AEROCHAMBER MV) inhaler [id number]  Use as instructed Ziglar, Susan K, MD  Active Self  Vitamin D , Ergocalciferol , (DRISDOL ) 1.25 MG (50000 UNIT) CAPS capsule [id number]  Take 1 capsule (50,000 Units total) by mouth every 7 (seven) days.  Patient not taking: Reported on 06/02/2024   Ziglar, Susan K, MD  Active Self           Med Note GAYLENE DARVIN JINNY [place] May 14, 2024 10:36 PM) Pt does not take, because was mistakenly taking 1qd instead of 1qw.            Recommendation:   Specialty provider follow-up- as scheduled with neurology provider 06/25/24 Continue Current Plan of Care  Follow Up Plan:   Telephone follow-up in 1 week- as scheduled 06/17/24 for TOC 30-day program case closure with transfer to longitudinal RN CM if no hospital readmission  Pls call/ message for questions,  Delylah Stanczyk Mckinney Trella Thurmond, RN, BSN, CCRN Alumnus RN Care Manager  Transitions of Care  [facility] - [facility] [phone]: direct office

## 2024-06-12 NOTE — Patient Instructions (Signed)
 Visit Information  Thank you for taking time to visit with me today. Please don't hesitate to contact me if I can be of assistance to you before our next scheduled telephone appointment.  Our next appointment is by telephone on Wednesday 06/17/24 at 1:00 pm  Please call the care guide team at [phone] if you need to cancel or reschedule your appointment.   Following are the goals we discussed today:  Patient Self Care Activities:  Attend all scheduled provider appointments Call pharmacy for medication refills 3-7 days in advance of running out of medications Call provider office for new concerns or questions  Participate in Transition of Care Program/Attend TOC scheduled calls Take medications as prescribed   Use assistive devices as needed to prevent falls- your cane and walker Please call the pain management clinic as per your stated plans to schedule an appointment and become established with a pain management specialist doctor  If you are experiencing a Mental Health or Behavioral Health Crisis or need someone to talk to, please  call the Suicide and Crisis Lifeline: 988 call the [place]  National Suicide Prevention Lifeline: 503-663-4202 or TTY: 5392078549 TTY 4503641356) to talk to a trained counselor call 1-800-273-TALK (toll free, 24 hour hotline) go to [facility] [address] [place] [phone]) call the [facility] Crisis Line: [phone] call 911   Patient verbalizes understanding of instructions and care plan provided today and agrees to view in MyChart. Active MyChart status and patient understanding of how to access instructions and care plan via MyChart confirmed with patient.     Michelle Harrison Mckinney [name], RN, BSN, Media planner  Transitions of Care  [facility] - [facility] [phone]: direct office

## 2024-06-16 ENCOUNTER — Other Ambulatory Visit: Payer: Self-pay | Admitting: Family Medicine

## 2024-06-16 DIAGNOSIS — G43711 Chronic migraine without aura, intractable, with status migrainosus: Secondary | ICD-10-CM

## 2024-06-16 DIAGNOSIS — R110 Nausea: Secondary | ICD-10-CM

## 2024-06-16 MED ORDER — HYDROCODONE-ACETAMINOPHEN 5-325 MG PO TABS: 1.0000 | ORAL_TABLET | Freq: Three times a day (TID) | ORAL | 0 refills | Status: DC | PRN

## 2024-06-16 MED ORDER — PROMETHAZINE HCL 25 MG PO TABS: 25.0000 mg | ORAL_TABLET | Freq: Four times a day (QID) | ORAL | 0 refills | Status: DC | PRN

## 2024-06-16 NOTE — Telephone Encounter (Signed)
 Already responded by different means

## 2024-06-16 NOTE — Telephone Encounter (Unsigned)
 Copied from CRM #[id number]. Topic: Clinical - Medication Refill >> Jun 16, 2024  9:31 AM Delon T wrote: Medication: promethazine  (PHENERGAN ) 25 MG tablet aspirin  EC 81 MG tablet HYDROcodone -acetaminophen  (NORCO/VICODIN) 5-325 MG tablet  Has the patient contacted their pharmacy? No (Agent: If no, request that the patient contact the pharmacy for the refill. If patient does not wish to contact the pharmacy document the reason why and proceed with request.) (Agent: If yes, when and what did the pharmacy advise?)  This is the patient's preferred pharmacy:  [facility] [address], [place] - [address] [place] [address] Phone: [phone] Fax: [phone]  Is this the correct pharmacy for this prescription? Yes If no, delete pharmacy and type the correct one.   Has the prescription been filled recently? Yes  Is the patient out of the medication? Yes  Has the patient been seen for an appointment in the last year OR does the patient have an upcoming appointment? Yes  Can we respond through MyChart? Yes  Agent: Please be advised that Rx refills may take up to 3 business days. We ask that you follow-up with your pharmacy.

## 2024-06-17 ENCOUNTER — Other Ambulatory Visit: Payer: Self-pay | Admitting: *Deleted

## 2024-06-17 DIAGNOSIS — I639 Cerebral infarction, unspecified: Secondary | ICD-10-CM

## 2024-06-17 NOTE — Patient Instructions (Signed)
 Visit Information  Thank you for taking time to visit with me today. Please don't hesitate to contact me if I can be of assistance to you before our next scheduled telephone appointment.  It has been a pleasure working with you over the last 30 days!  Great job managing your care after your hospital visit!  I am glad that you are doing well!  Please listen for a call from the scheduling care guide to schedule a phone call with the new nurse care manager who will pick up in your care where we are leaving off today  Please call the care guide team at [phone] if you need to cancel or reschedule your appointment.   Following are the goals we discussed today:  Patient Self Care Activities:  Attend all scheduled provider appointments Call pharmacy for medication refills 3-7 days in advance of running out of medications Call provider office for new concerns or questions  Take medications as prescribed   Use assistive devices as needed to prevent falls- your cane and walker if needed Please call the pain management clinic as per your stated plans to schedule an appointment and become established with a pain management specialist doctor  If you are experiencing a Mental Health or Behavioral Health Crisis or need someone to talk to, please  call the Suicide and Crisis Lifeline: 988 call the [place]  National Suicide Prevention Lifeline: 671-542-8843 or TTY: (760) 697-7152 TTY 3183831885) to talk to a trained counselor call 1-800-273-TALK (toll free, 24 hour hotline) go to [facility] [address] [place] [phone]) call the [facility] Crisis Line: ([phone])088-5687 call 911   Patient verbalizes understanding of instructions and care plan provided today and agrees to view in MyChart. Active MyChart status and patient understanding of how to access instructions and care plan via MyChart confirmed with patient.     Maresa Morash Mckinney Dustin Burrill, RN, BSN, Marketing executive  Transitions of Care  VBCI - [facility] [phone]: direct office

## 2024-06-17 NOTE — Transitions of Care (Post Inpatient/ED Visit) (Signed)
 Transition of Care week # 5/ day # 29  Visit Note  06/17/2024  Name: Michelle Conley MRN: [id number]          DOB: [birth year]  Situation: Patient enrolled in [facility] 30-day program. Visit completed with patient by telephone.   HIPAA identifiers x 2 verified  06/17/24:  TOC 30--day outreach completed; patient has successfully met/ accomplished established goals for TOC 30-day program without hospital readmission and referral was sent for ongoing follow up with longitudinal RN CM   Background:  Recent hospitalization July 10-14, 2025 for CVA- ongoing visual disturbance (1) unplanned hospital admission x last 6/ and 12 months  Initial Transition Care Management Follow-up Telephone Call    Past Medical History:  Diagnosis Date   Abnormal chest xray    Reports that she has spots on her lungs   Anemia    Anxiety    Aortic valve disorder    Aortic valve insufficiency    Asthma    Cervical dysplasia    Complication of anesthesia    reports that she comes out of it slowly   COPD (chronic obstructive pulmonary disease) (HCC)    Cranial nerve palsy    Depression    GERD (gastroesophageal reflux disease)    Goiter    History of kidney stones    History of meningitis    History of viral encephalitis    Hypertension    Hypothyroidism    Migraine headache    Post traumatic stress disorder (PTSD)    Scarlet fever    Shortness of breath dyspnea    Stroke (HCC)    Syncope and collapse    Assessment:  I am doing somewhat better- the migraines have eased off and are less frequent, and when I close one eye, my vision is clear--- it is only when both eyes are open that I have blurriness; I am looking forward to going to the eye doctor and the neurologist next week.  Still not using my cane or walker and so far no falls, but I admit there has been some stumbling.  The heart monitor has been off for a few days.  Over all-- things are better and I am doing okay   Denies specific clinical  concerns and sounds to be in no distress throughout [facility] 30-day program outreach call today  Patient Reported Symptoms: Cognitive Cognitive Status: Normal speech and language skills, Insightful and able to interpret abstract concepts, Alert and oriented to person, place, and time Cognitive/Intellectual Conditions Management [RPT]: Other Other: History of CVA      Neurological Neurological Review of Symptoms: Headaches, Vision changes Oher Neurological Symptoms/Conditions [RPT]: Reports the headaches are easing off and my vision is slowly improving- still have issues when both eyes are open, but I am seeing some improvement; I will be attending the appointment with the neurology doctor next week on 06/25/24 Neurological Management Strategies: Medication therapy, Routine screening, Coping strategies, Adequate rest  HEENT HEENT Symptoms Reported: No symptoms reported      Cardiovascular Cardiovascular Symptoms Reported: No symptoms reported Other Cardiovascular Symptoms: Confirmed has now completed wearing the home heart monitor- has plans to return to cardiology provider and will await results- she has communicated with cardiology provider regarding follow up Does patient have uncontrolled Hypertension?: No Cardiovascular Management Strategies: Medication therapy, Routine screening, Coping strategies, Adequate rest  Respiratory Respiratory Symptoms Reported: No symptoms reported Other Respiratory Symptoms: Denies shortness of breath and cough; sounds to be in no respiratory distress throughout [facility]  call Respiratory Management Strategies: Asthma action plan, Medication therapy, Coping strategies, Routine screening, Adequate rest  Endocrine Endocrine Symptoms Reported: No symptoms reported Is patient diabetic?: No    Gastrointestinal Gastrointestinal Symptoms Reported: No symptoms reported      Genitourinary Genitourinary Symptoms Reported: No symptoms reported    Integumentary Integumentary  Symptoms Reported: No symptoms reported    Musculoskeletal Musculoskelatal Symptoms Reviewed: No symptoms reported Additional Musculoskeletal Details: confirmed not currently requiring/ using assistive devices for ambulation Musculoskeletal Management Strategies: Routine screening, Coping strategies Falls in the past year?: No Number of falls in past year: 1 or less Was there an injury with Fall?: No Fall Risk Category Calculator: 0 Patient Fall Risk Level: Low Fall Risk Patient at Risk for Falls Due to: Medication side effect, Impaired vision, Other (Comment) (history CVA) Fall risk Follow up: Falls prevention discussed, Education provided  Psychosocial Psychosocial Symptoms Reported: No symptoms reported Behavioral Management Strategies: Coping strategies, Counseling, Medication therapy Techniques to Cope with Loss/Stress/Change: Medication, Diversional activities, Counseling Quality of Family Relationships: helpful, involved, supportive Do you feel physically threatened by others?: No   There were no vitals filed for this visit.  Medications Reviewed Today     Reviewed by Kolbee Stallman M, RN (Registered Nurse) on 06/17/24 at 1301  Med List Status: <None>   Medication Order Taking? Sig Documenting Provider Last Dose Status Informant  acetaminophen  (TYLENOL ) 500 MG tablet [id number]  Take 500 mg by mouth every 8 (eight) hours as needed for headache, mild pain (pain score 1-3) or fever.  Patient not taking: Reported on 06/02/2024   [provider]  Active Self  albuterol  (VENTOLIN  HFA) 108 (90 Base) MCG/ACT inhaler [id number]  Inhale 2 puffs into the lungs every 6 (six) hours as needed for wheezing or shortness of breath. Ziglar, Susan K, MD  Active   alprazolam  (XANAX ) 2 MG tablet [id number]  1 po QID PRN anxiety Ziglar, Susan K, MD  Active   aspirin  EC 81 MG tablet [id number]  Take 1 tablet (81 mg total) by mouth daily. Swallow whole. Jhonny Calvin NOVAK, MD  Active    atorvastatin  (LIPITOR) 80 MG tablet [id number]  Take 1 tablet (80 mg total) by mouth daily. Ziglar, Susan K, MD  Active Self  DUPIXENT 300 MG/2ML EMMANUEL [id number]  Inject into the skin.  Patient not taking: Reported on 06/02/2024   [provider]  Active Self  EPINEPHrine  0.3 mg/0.3 mL IJ SOAJ injection [id number]  Inject 0.3 mg into the muscle once as needed (allergic reaction). [provider]  Active Self  escitalopram  (LEXAPRO ) 20 MG tablet [id number]  Take 20 mg by mouth daily. [provider]  Active Self  Fluticasone-Umeclidin-Vilant (TRELEGY ELLIPTA ) 100-62.5-25 MCG/ACT AEPB [id number]  Inhale 1 Inhalation into the lungs daily.  Patient not taking: Reported on 06/02/2024   Ziglar, Susan K, MD  Active Self  furosemide  (LASIX ) 20 MG tablet [id number]  Take 1 tablet (20 mg total) by mouth daily. Ziglar, Susan K, MD  Active   HYDROcodone -acetaminophen  (NORCO/VICODIN) 5-325 MG tablet [id number]  Take 1 tablet by mouth every 8 (eight) hours as needed for moderate pain (pain score 4-6). Ziglar, Susan K, MD  Active   ibuprofen  (ADVIL ) 800 MG tablet [id number]  Take 1 tablet (800 mg total) by mouth every 8 (eight) hours as needed for mild pain (pain score 1-3) or moderate pain (pain score 4-6). Ziglar, Susan K, MD  Active Self  icosapent  Ethyl (VASCEPA ) 1 g capsule [id number]  Take 4 capsules (4  g total) by mouth 2 (two) times daily. Ziglar, Susan K, MD  Active Self  ipratropium-albuterol  (DUONEB) 0.5-2.5 (3) MG/3ML SOLN [id number]  Inhale 3 mLs into the lungs every 6 (six) hours as needed. [provider]  Active Self  levothyroxine  (SYNTHROID ) 150 MCG tablet [id number]  Take 1 tablet (150 mcg total) by mouth daily. Ziglar, Susan K, MD  Active Self  methocarbamol  (ROBAXIN ) 750 MG tablet [id number]  Take 2 tablets (1,500 mg total) by mouth 3 (three) times daily. Ziglar, Susan K, MD  Active Self  naloxone  (NARCAN ) nasal spray 4 mg/0.1 mL [id number]  Call 911. Administer a single  spray in one nostril. If no or minimal response after 2 to 3 minutes, an additional dose may be given in the alternate nostril. [provider]  Active Self  nicotine (NICODERM CQ - DOSED IN MG/24 HOURS) 21 mg/24hr patch [id number]  Place onto the skin.  Patient not taking: Reported on 06/02/2024   [provider]  Active Self  nortriptyline  (PAMELOR ) 50 MG capsule [id number]  Take 1 capsule (50 mg total) by mouth at bedtime. Ziglar, Susan K, MD  Active   omeprazole  (PRILOSEC) 20 MG capsule [id number]  Take 1 capsule (20 mg total) by mouth daily. Ziglar, Susan K, MD  Active   promethazine  (PHENERGAN ) 25 MG tablet [id number]  Take 1 tablet (25 mg total) by mouth every 6 (six) hours as needed. Ziglar, Susan K, MD  Active   Spacer/Aero-Holding Chambers (AEROCHAMBER MV) inhaler [id number]  Use as instructed Ziglar, Susan K, MD  Active Self  Vitamin D , Ergocalciferol , (DRISDOL ) 1.25 MG (50000 UNIT) CAPS capsule [id number]  Take 1 capsule (50,000 Units total) by mouth every 7 (seven) days.  Patient not taking: Reported on 06/02/2024   Ziglar, Susan K, MD  Active Self           Med Note GAYLENE DARVIN JINNY [place] May 14, 2024 10:36 PM) Pt does not take, because was mistakenly taking 1qd instead of 1qw.            Recommendation:   Specialty provider follow-up vision provider and neurology provider- as scheduled Continue Current Plan of Care  Follow Up Plan:   Referral to RN Case Manager Closing From:  Transitions of Care Program  Pls call/ message for questions,  Wilberto Console Mckinney Montgomery Rothlisberger, RN, BSN, CCRN Alumnus RN Care Manager  Transitions of Care  VBCI - [facility] [phone]: direct office

## 2024-07-07 ENCOUNTER — Telehealth: Payer: Self-pay

## 2024-07-07 NOTE — Patient Instructions (Signed)
 [name] - I am sorry I was unable to reach you today for our scheduled appointment. I work with Ziglar, Susan K, MD and am calling to support your healthcare needs. Please contact me at [phone] at your earliest convenience. I look forward to speaking with you soon.   Thank you,  Hendricks Her RN, BSN  [place] [facility]BCI-Population Health RN Case Manager   Direct [phone]

## 2024-07-10 ENCOUNTER — Other Ambulatory Visit: Payer: Self-pay | Admitting: Family Medicine

## 2024-07-10 DIAGNOSIS — R110 Nausea: Secondary | ICD-10-CM

## 2024-07-13 ENCOUNTER — Telehealth: Payer: Self-pay

## 2024-07-13 NOTE — Progress Notes (Unsigned)
 Complex Care Management Care Guide Note  07/13/2024 Name: Michelle Conley MRN: [id number] DOB: [birth year]  Michelle Conley is a 53 y.o. year old female who is a primary care patient of Ziglar, Susan K, MD and is actively engaged with the care management team. I reached out to Michelle Conley by phone today to assist with re-scheduling  with the Pharmacist.  Follow up plan: Unsuccessful telephone outreach attempt made. A HIPAA compliant phone message was left for the patient providing contact information and requesting a return call.  Jeoffrey Buffalo , RMA     [facility]  [facility], [facility] Guide  Direct Dial: [phone]  Website: [email or web address]

## 2024-07-14 ENCOUNTER — Ambulatory Visit: Admitting: Family Medicine

## 2024-07-14 ENCOUNTER — Other Ambulatory Visit: Payer: Self-pay | Admitting: Family Medicine

## 2024-07-14 DIAGNOSIS — R110 Nausea: Secondary | ICD-10-CM

## 2024-07-14 MED ORDER — PROMETHAZINE HCL 25 MG PO TABS: 25.0000 mg | ORAL_TABLET | Freq: Four times a day (QID) | ORAL | 0 refills | Status: DC | PRN

## 2024-07-14 NOTE — Telephone Encounter (Signed)
 Copied from CRM [id number]. Topic: Clinical - Medication Refill >> Jul 14, 2024 11:55 AM Tiffini S wrote: Medication: promethazine  (PHENERGAN ) 25 MG tablet   Has the patient contacted their pharmacy? Yes (Agent: If no, request that the patient contact the pharmacy for the refill. If patient does not wish to contact the pharmacy document the reason why and proceed with request.) (Agent: If yes, when and what did the pharmacy advise?)  This is the patient's preferred pharmacy:  [facility] [address], [place] - [address] [place] [address] Phone: [phone] Fax: [phone]  Is this the correct pharmacy for this prescription? No If no, delete pharmacy and type the correct one.   Has the prescription been filled recently? Yes  Is the patient out of the medication? Yes  Has the patient been seen for an appointment in the last year OR does the patient have an upcoming appointment? Yes  Can we respond through MyChart? No, please call the patient at [phone]  Agent: Please be advised that Rx refills may take up to 3 business days. We ask that you follow-up with your pharmacy.

## 2024-07-15 NOTE — Progress Notes (Signed)
 Complex Care Management Care Guide Note  07/15/2024 Name: DOYNE ELLINGER MRN: [id number] DOB: Feb 23, 1971  ETHYLE TIEDT is a 52 y.o. year old female who is a primary care patient of Ziglar, Susan K, MD and is actively engaged with the care management team. I reached out to [name] by phone today to assist with re-scheduling  with the RN Case Manager.  Follow up plan: Telephone appointment with complex care management team member scheduled for:  07/31/2024  Jeoffrey Buffalo , RMA     [place]  [facility], [facility] Guide  Direct Dial: [phone]  Website: [email or web address]

## 2024-07-19 ENCOUNTER — Other Ambulatory Visit: Payer: Self-pay | Admitting: Family Medicine

## 2024-07-19 DIAGNOSIS — M546 Pain in thoracic spine: Secondary | ICD-10-CM

## 2024-07-31 ENCOUNTER — Other Ambulatory Visit: Payer: Self-pay

## 2024-07-31 NOTE — Patient Instructions (Signed)
 Visit Information  Michelle Conley was given information about Medicaid Managed Care team care coordination services as a part of their [place] benefit.   If you would like to schedule transportation through your [place] plan, please call the following number at least 2 days in advance of your appointment: [phone].   There is no limit to the number of trips during the year between medical appointments, healthcare facilities, or pharmacies. Transportation must be scheduled at least 2 business days before but not more than thirty 30 days before of your appointment.  Call the Behavioral Health Crisis Line at [phone], at any time, 24 hours a day, 7 days a week. If you are in danger or need immediate medical attention call 911.   Please see education materials related to Stroke provided by MyChart link.  Patient verbalizes understanding of instructions and care plan provided today and agrees to view in MyChart. Active MyChart status and patient understanding of how to access instructions and care plan via MyChart confirmed with patient.     Telephone follow up appointment with Managed Medicaid care management team member scheduled for:08-14-2024 at 11:00 AM   Hendricks Her RN, BSN  [place] VBCI-Population Health RN Case Manager   Direct [phone]   Following is a copy of your plan of care:  There are no care plans that you recently modified to display for this patient.

## 2024-08-11 ENCOUNTER — Telehealth (INDEPENDENT_AMBULATORY_CARE_PROVIDER_SITE_OTHER): Admitting: Family Medicine

## 2024-08-11 ENCOUNTER — Other Ambulatory Visit: Payer: Self-pay | Admitting: Family Medicine

## 2024-08-11 ENCOUNTER — Telehealth: Payer: Self-pay

## 2024-08-11 DIAGNOSIS — I10 Essential (primary) hypertension: Secondary | ICD-10-CM

## 2024-08-11 DIAGNOSIS — J418 Mixed simple and mucopurulent chronic bronchitis: Secondary | ICD-10-CM

## 2024-08-11 MED ORDER — TIOTROPIUM BROMIDE MONOHYDRATE 18 MCG IN CAPS: 18.0000 ug | ORAL_CAPSULE | Freq: Every day | RESPIRATORY_TRACT | 3 refills | Status: AC

## 2024-08-11 MED ORDER — DOXYCYCLINE HYCLATE 100 MG PO CAPS: 100.0000 mg | ORAL_CAPSULE | Freq: Two times a day (BID) | ORAL | 0 refills | Status: DC

## 2024-08-11 MED ORDER — PREDNISONE 10 MG (48) PO TBPK: ORAL_TABLET | Freq: Every day | ORAL | 0 refills | Status: DC

## 2024-08-11 MED ORDER — FLUTICASONE-SALMETEROL 250-50 MCG/ACT IN AEPB: 1.0000 | INHALATION_SPRAY | Freq: Two times a day (BID) | RESPIRATORY_TRACT | 5 refills | Status: AC

## 2024-08-11 NOTE — Telephone Encounter (Signed)
(  KeyBETHA HALO)  Rx #: [id number]  Drug Tiotropium Bromide  capsules  Form [place] [employer] Managed Medicaid Electronic Prior Authorization Request Form Original Claim Info [phone]

## 2024-08-11 NOTE — Telephone Encounter (Signed)
(  Key: [id number])  PA Case ID #: [id number]  Rx #: [id number]   Status Sent to Plan today  Drug Fluticasone-Salmeterol 250-50MCG/ACT aerosol powder  Form [place] Complete [employer] Managed Medicaid Electronic Prior Authorization Request Form Original Claim Info

## 2024-08-11 NOTE — Progress Notes (Signed)
   Established Patient Office Visit  Subjective   Patient ID: Michelle Conley, female    DOB: 22-May-1971  Age: 53 y.o. MRN: [id number]  No chief complaint on file. Virtual Visit via Video Note  I connected with Michelle Conley on 08/11/24 at  1:50 PM EDT by a video enabled telemedicine application and verified that I am speaking with the correct person using two identifiers.  Location: Patient: At her daughter's house Provider: At work in my office   I discussed the limitations of evaluation and management by telemedicine and the availability of in person appointments. The patient expressed understanding and agreed to proceed.  History of Present Illness: Delightful 53 year old with HTN, hypothyroidism, aortic insufficiency (Dr. Lucyann), COPD (Dr. Parris), chronic headaches (Dr. Fayette), anxiety, adenomatous colon polyps (07/2023, repeat in 5 years), nicotine addiction, anxiety and depression (Dr. Beverley), CVA, s/p hysterectomy and s/p choleycystectomy.  For 1 to 2 weeks now she has had a cough.  At times it is productive of clear to white sputum's and other times it is a dry cough.  She is aware that she is wheezing.  She has an albuterol  inhaler and she has been using this.  She had Trelegy but apparently could not get it filled by the insurance.  She has pain behind her eyes that she thinks is come from coughing.  She has not been using her nebulizer because she is not at home.  She feels short of breath with ambulation at times.  She denies any fever chills or night sweats.  She has an appointment with pulmonology later this week.   Observations/Objective:   Assessment and Plan: Prednisone  Dosepak 10 mg over 12 days and doxycycline  100 mg twice daily for 10 days.  After you finished that she need to be on controlling medications.  Trial of Advair 250/50 and Spiriva  daily. If you develop a fever or increased shortness of breath cannot keep down fluids you need to be seen in the  ED.  Follow Up Instructions:    I discussed the assessment and treatment plan with the patient. The patient was provided an opportunity to ask questions and all were answered. The patient agreed with the plan and demonstrated an understanding of the instructions.   The patient was advised to call back or seek an in-person evaluation if the symptoms worsen or if the condition fails to improve as anticipated.  I provided 12 minutes of non-face-to-face time during this encounter.   Michelle MARLA Mock, MD                                Assessment & Plan:  Mixed simple and mucopurulent chronic bronchitis (HCC) -     predniSONE ; Take by mouth daily. 12-day taper pack, use as directed for taper  Dispense: 1 tablet; Refill: 0 -     Doxycycline  Hyclate; Take 1 capsule (100 mg total) by mouth 2 (two) times daily.  Dispense: 20 capsule; Refill: 0 -     Fluticasone-Salmeterol; Inhale 1 puff into the lungs in the morning and at bedtime.  Dispense: 1 each; Refill: 5 -     Tiotropium Bromide  Monohydrate; Place 1 capsule (18 mcg total) into inhaler and inhale daily.  Dispense: 90 capsule; Refill: 3     No follow-ups on file.    Michelle Conley Michelle Ausar Georgiou, MD

## 2024-08-12 NOTE — Telephone Encounter (Signed)
 Outcome Approved on October 7 by [place] [employer] MCD 2017  Approved. This drug is covered on the Preferred Drug List. It does not require prior approval. Only specific National Drug Codes [facility]) are covered.  The [facility] your pharmacy is using is not covered. The pharmacy can use NDC(s): Z1396878. Please call the pharmacy to process the claim.  Effective Date: 08/11/2024 Authorization Expiration Date: 11/04/2024

## 2024-08-12 NOTE — Telephone Encounter (Signed)
 Outcome Approved on October 7 by [place] [employer] M[place] 2017  Approved. This drug is covered on the Preferred Drug List. It does not require prior approval.   Only specific National Drug Codes [facility]) are covered. The [facility] your pharmacy is using is not covered. The pharmacy can use NDC(s): C9411314, [id number]. You filled this medication on 08/11/2024 for a quantity of 90 for a 90 day supply at [facility].  Your next refill is on 10/17/2024. Effective Date: 08/11/2024 Authorization Expiration Date: 11/04/2024

## 2024-08-13 NOTE — Telephone Encounter (Unsigned)
 Copied from CRM #[id number]. Topic: Clinical - Medication Refill >> Aug 13, 2024  3:45 PM Olam RAMAN wrote: Medication: ibuprofen  (ADVIL ) 800 MG tablet   Has the patient contacted their pharmacy? Yes (Agent: If no, request that the patient contact the pharmacy for the refill. If patient does not wish to contact the pharmacy document the reason why and proceed with request.) (Agent: If yes, when and what did the pharmacy advise?)  This is the patient's preferred pharmacy:  [facility] [address], [place] - [address] [place] [address] Phone: [phone] Fax: [phone]  Is this the correct pharmacy for this prescription? Yes If no, delete pharmacy and type the correct one.   Has the prescription been filled recently? Yes  Is the patient out of the medication? No  Has the patient been seen for an appointment in the last year OR does the patient have an upcoming appointment? Yes  Can we respond through MyChart? No  Agent: Please be advised that Rx refills may take up to 3 business days. We ask that you follow-up with your pharmacy.

## 2024-08-14 ENCOUNTER — Other Ambulatory Visit: Payer: Self-pay | Admitting: Family Medicine

## 2024-08-14 ENCOUNTER — Other Ambulatory Visit: Payer: Self-pay

## 2024-08-14 DIAGNOSIS — M546 Pain in thoracic spine: Secondary | ICD-10-CM

## 2024-08-14 DIAGNOSIS — I10 Essential (primary) hypertension: Secondary | ICD-10-CM

## 2024-08-14 NOTE — Telephone Encounter (Signed)
 Copied from CRM #[id number]. Topic: Clinical - Medication Refill >> Aug 13, 2024  3:45 PM Olam RAMAN wrote: Medication: ibuprofen  (ADVIL ) 800 MG tablet   Has the patient contacted their pharmacy? Yes (Agent: If no, request that the patient contact the pharmacy for the refill. If patient does not wish to contact the pharmacy document the reason why and proceed with request.) (Agent: If yes, when and what did the pharmacy advise?)  This is the patient's preferred pharmacy:  [facility] [address] [place] - [address] [place] [address] Phone: [phone] Fax: [phone]  Is this the correct pharmacy for this prescription? Yes If no, delete pharmacy and type the correct one.   Has the prescription been filled recently? Yes  Is the patient out of the medication? No  Has the patient been seen for an appointment in the last year OR does the patient have an upcoming appointment? Yes  Can we respond through MyChart? No  Agent: Please be advised that Rx refills may take up to 3 business days. We ask that you follow-up with your pharmacy. >> Aug 14, 2024  1:39 PM Montie POUR wrote: Michelle Conley is calling back to check on the above medications. She has been out of this medication for 32 hours and pharmacy needs order to refill. I let her know that it could take up to 3 business days but she wants to get in today.

## 2024-08-14 NOTE — Patient Instructions (Signed)
 Visit Information  Ms. Aron was given information about Medicaid Managed Care team care coordination services as a part of their [place] benefit.   If you would like to schedule transportation through your [place] plan, please call the following number at least 2 days in advance of your appointment: [phone].   There is no limit to the number of trips during the year between medical appointments, healthcare facilities, or pharmacies. Transportation must be scheduled at least 2 business days before but not more than thirty 30 days before of your appointment.  Call the Behavioral Health Crisis Line at [phone], at any time, 24 hours a day, 7 days a week. If you are in danger or need immediate medical attention call 911.   Please see education materials related to Stroke  provided by MyChart link.  Patient verbalizes understanding of instructions and care plan provided today and agrees to view in MyChart. Active MyChart status and patient understanding of how to access instructions and care plan via MyChart confirmed with patient.     Telephone follow up appointment with Managed Medicaid care management team member scheduled for: 09-14-2024 at 10:00 AM   Hendricks Her RN, BSN  [place] [facility] RN Case Manager   Direct [phone]    Following is a copy of your plan of care:  There are no care plans that you recently modified to display for this patient.

## 2024-08-14 NOTE — Telephone Encounter (Signed)
 Copied from CRM [id number]. Topic: Clinical - Medication Refill >> Aug 14, 2024  1:43 PM Montie POUR wrote: Medication: furosemide  (LASIX ) 20 MG tablet  Has the patient contacted their pharmacy? Yes (Agent: If no, request that the patient contact the pharmacy for the refill. If patient does not wish to contact the pharmacy document the reason why and proceed with request.) (Agent: If yes, when and what did the pharmacy advise?) Pharmacy needs order to refill  This is the patient's preferred pharmacy:  [facility] [address] [place] - [address] [place] [address] Phone: [phone] Fax: [phone]  Is this the correct pharmacy for this prescription? Yes If no, delete pharmacy and type the correct one.   Has the prescription been filled recently? No  Is the patient out of the medication? No  Has the patient been seen for an appointment in the last year OR does the patient have an upcoming appointment? Yes  Can we respond through MyChart? Yes  Agent: Please be advised that Rx refills may take up to 3 business days. We ask that you follow-up with your pharmacy.

## 2024-08-15 ENCOUNTER — Other Ambulatory Visit: Payer: Self-pay | Admitting: Family Medicine

## 2024-08-15 DIAGNOSIS — G43E09 Chronic migraine with aura, not intractable, without status migrainosus: Secondary | ICD-10-CM

## 2024-08-15 MED ORDER — IBUPROFEN 800 MG PO TABS: 800.0000 mg | ORAL_TABLET | Freq: Three times a day (TID) | ORAL | 2 refills | Status: DC | PRN

## 2024-08-17 ENCOUNTER — Other Ambulatory Visit: Payer: Self-pay | Admitting: Family Medicine

## 2024-08-17 DIAGNOSIS — M810 Age-related osteoporosis without current pathological fracture: Secondary | ICD-10-CM

## 2024-08-17 MED ORDER — IBUPROFEN 800 MG PO TABS: 800.0000 mg | ORAL_TABLET | Freq: Three times a day (TID) | ORAL | 1 refills | Status: AC | PRN

## 2024-08-17 MED ORDER — FUROSEMIDE 20 MG PO TABS: 20.0000 mg | ORAL_TABLET | Freq: Every day | ORAL | 1 refills | Status: DC

## 2024-09-09 ENCOUNTER — Other Ambulatory Visit: Payer: Self-pay | Admitting: Family Medicine

## 2024-09-09 DIAGNOSIS — R110 Nausea: Secondary | ICD-10-CM

## 2024-09-10 ENCOUNTER — Other Ambulatory Visit: Payer: Self-pay | Admitting: Pulmonary Disease

## 2024-09-10 DIAGNOSIS — J449 Chronic obstructive pulmonary disease, unspecified: Secondary | ICD-10-CM

## 2024-09-10 DIAGNOSIS — F17210 Nicotine dependence, cigarettes, uncomplicated: Secondary | ICD-10-CM

## 2024-09-14 ENCOUNTER — Other Ambulatory Visit: Payer: Self-pay

## 2024-09-14 ENCOUNTER — Telehealth: Payer: Self-pay | Admitting: Family Medicine

## 2024-09-14 ENCOUNTER — Telehealth: Payer: Self-pay

## 2024-09-14 NOTE — Telephone Encounter (Signed)
Called but could not leave message because mailbox is full.

## 2024-09-14 NOTE — Telephone Encounter (Signed)
 Copied from CRM [id number]. Topic: Clinical - Medical Advice >> Sep 14, 2024 11:19 AM Lonell PEDLAR wrote: Reason for CRM: Patient missed telephone call pat this morning and would like to r/s. Patient stated Dr. Ziglar reached out. Please review and advise. C/B: [phone]

## 2024-09-14 NOTE — Patient Instructions (Signed)
 Visit Information  Ms. Poulsen was given information about Medicaid Managed Care team care coordination services as a part of their [place] benefit.   If you would like to schedule transportation through your [place] plan, please call the following number at least 2 days in advance of your appointment: [phone].   There is no limit to the number of trips during the year between medical appointments, healthcare facilities, or pharmacies. Transportation must be scheduled at least 2 business days before but not more than thirty 30 days before of your appointment.  Call the Behavioral Health Crisis Line at [phone], at any time, 24 hours a day, 7 days a week. If you are in danger or need immediate medical attention call 911.   Please see education materials related to COPD and Stroke  provided by MyChart link.  Care plan and visit instructions communicated with the patient verbally today. Patient agrees to receive a copy in MyChart. Active MyChart status and patient understanding of how to access instructions and care plan via MyChart confirmed with patient.     Telephone follow up appointment with Managed Medicaid care management team member scheduled for: 10-14-2024 at 10:00 AM   Hendricks Her RN, BSN  [place] [facility] RN Case Manager   Direct [phone]   Following is a copy of your plan of care:  There are no care plans that you recently modified to display for this patient.

## 2024-09-15 ENCOUNTER — Ambulatory Visit: Payer: Self-pay

## 2024-09-15 ENCOUNTER — Telehealth: Payer: Self-pay | Admitting: Family Medicine

## 2024-09-15 ENCOUNTER — Other Ambulatory Visit: Payer: Self-pay | Admitting: Family Medicine

## 2024-09-15 DIAGNOSIS — R110 Nausea: Secondary | ICD-10-CM

## 2024-09-15 DIAGNOSIS — G8929 Other chronic pain: Secondary | ICD-10-CM

## 2024-09-15 MED ORDER — METHOCARBAMOL 750 MG PO TABS: 1500.0000 mg | ORAL_TABLET | Freq: Three times a day (TID) | ORAL | 3 refills | Status: AC

## 2024-09-15 MED ORDER — PROMETHAZINE HCL 25 MG PO TABS: 25.0000 mg | ORAL_TABLET | Freq: Four times a day (QID) | ORAL | 2 refills | Status: DC | PRN

## 2024-09-15 NOTE — Telephone Encounter (Signed)
 FYI Only or Action Required?: FYI only for provider: appointment scheduled on tomorrow.  Patient was last seen in primary care on 08/11/2024 by Ziglar, Susan K, MD.  Called Nurse Triage reporting Cough.  Symptoms began a week ago.  Interventions attempted: Nothing.  Symptoms are: gradually worsening.  Triage Disposition: See Physician Within 24 Hours  Patient/caregiver understands and will follow disposition?: Yes, will follow disposition  Copied from CRM [id number]. Topic: Clinical - Red Word Triage >> Sep 15, 2024  9:32 AM Michelle Conley wrote: Red Word that prompted transfer to Nurse Triage: feeling achy all over, mucous is colorful, yellow/green/spots of red. Pt has copd stage 3 and stated its acting up.   ----------------------------------------------------------------------- From previous Reason for Contact - Scheduling: Patient/patient representative is calling to schedule an appointment. Refer to attachments for appointment information. Reason for Disposition  SEVERE coughing spells (e.g., whooping sound after coughing, vomiting after coughing)  Answer Assessment - Initial Assessment Questions 1. ONSET: When did the cough begin?      About 1.5 weeks 2. SEVERITY: How bad is the cough today?      severe 3. SPUTUM: Describe the color of your sputum (e.g., none, dry cough; clear, white, yellow, green)     Yellow green, occasional red spots 4. HEMOPTYSIS: Are you coughing up any blood? If Yes, ask: How much? (e.g., flecks, streaks, tablespoons, etc.)     flecks 5. DIFFICULTY BREATHING: Are you having difficulty breathing? If Yes, ask: How bad is it? (e.g., mild, moderate, severe)      At times 6. FEVER: Do you have a fever? If Yes, ask: What is your temperature, how was it measured, and when did it start?     denies 7. CARDIAC HISTORY: Do you have any history of heart disease? (e.g., heart attack, congestive heart failure)      denies 8. LUNG HISTORY: Do you  have any history of lung disease?  (e.g., pulmonary embolus, asthma, emphysema)     COPD stage 3 10. OTHER SYMPTOMS: Do you have any other symptoms? (e.g., runny nose, wheezing, chest pain)       CP from coughing so hard-per pt, nose drainage, cough,  Pt was offered appt today, declined states that she had another appt already.  Protocols used: Cough - Acute Productive-A-AH

## 2024-09-16 ENCOUNTER — Ambulatory Visit: Admitting: Family Medicine

## 2024-09-16 ENCOUNTER — Encounter: Payer: Self-pay | Admitting: Family Medicine

## 2024-09-16 ENCOUNTER — Ambulatory Visit
Admission: RE | Admit: 2024-09-16 | Discharge: 2024-09-16 | Disposition: A | Discharge status: Home / Self Care | Source: Ambulatory Visit | Attending: Pulmonary Disease | Admitting: Pulmonary Disease

## 2024-09-16 VITALS — BP 129/84 | HR 94 | Temp 97.9°F | Resp 18 | Ht <= 58 in | Wt 154.0 lb

## 2024-09-16 DIAGNOSIS — J449 Chronic obstructive pulmonary disease, unspecified: Secondary | ICD-10-CM | POA: Diagnosis present

## 2024-09-16 DIAGNOSIS — J441 Chronic obstructive pulmonary disease with (acute) exacerbation: Secondary | ICD-10-CM | POA: Diagnosis not present

## 2024-09-16 DIAGNOSIS — F17210 Nicotine dependence, cigarettes, uncomplicated: Secondary | ICD-10-CM | POA: Insufficient documentation

## 2024-09-16 MED ORDER — AZITHROMYCIN 250 MG PO TABS: ORAL_TABLET | ORAL | 0 refills | Status: AC

## 2024-09-16 MED ORDER — GUAIFENESIN-CODEINE 100-10 MG/5ML PO SOLN: 5.0000 mL | Freq: Three times a day (TID) | ORAL | 0 refills | Status: DC | PRN

## 2024-09-16 MED ORDER — PREDNISONE 20 MG PO TABS: 20.0000 mg | ORAL_TABLET | Freq: Every day | ORAL | 0 refills | Status: DC
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
## 2024-09-20 NOTE — Assessment & Plan Note (Signed)
 Will give her a steroid 20 mg daily for 5 days and Z-Pak.  She does not feel that Tessalon  is very effective for her.  Will give her guaifenesin and codeine cough syrup 5 cc four times a day.  If you are not improving significantly please come back.

## 2024-09-25 ENCOUNTER — Telehealth: Payer: Self-pay | Admitting: Family Medicine

## 2024-09-25 NOTE — Telephone Encounter (Signed)
 Called and LM that I was calling about her liver report.  Will try her again on Monday

## 2024-09-25 NOTE — Telephone Encounter (Signed)
 Copied from CRM [id number]. Topic: General - Other >> Sep 25, 2024 10:26 AM Zebedee SAUNDERS wrote: Reason for CRM: Pt would like a call back regarding imaging that was taken which they found something on her liver. She is scheduled to see Dr. Ziglar on 09/30/2024.

## 2024-09-30 ENCOUNTER — Ambulatory Visit: Admitting: Family Medicine

## 2024-09-30 ENCOUNTER — Encounter: Payer: Self-pay | Admitting: Family Medicine

## 2024-09-30 VITALS — BP 138/84 | HR 91 | Temp 98.1°F | Resp 18 | Ht <= 58 in | Wt 153.0 lb

## 2024-09-30 DIAGNOSIS — K769 Liver disease, unspecified: Secondary | ICD-10-CM | POA: Diagnosis not present

## 2024-09-30 DIAGNOSIS — Z23 Encounter for immunization: Secondary | ICD-10-CM

## 2024-09-30 DIAGNOSIS — J0100 Acute maxillary sinusitis, unspecified: Secondary | ICD-10-CM | POA: Diagnosis not present

## 2024-09-30 DIAGNOSIS — J329 Chronic sinusitis, unspecified: Secondary | ICD-10-CM | POA: Insufficient documentation

## 2024-09-30 MED ORDER — SULFAMETHOXAZOLE-TRIMETHOPRIM 800-160 MG PO TABS: 1.0000 | ORAL_TABLET | Freq: Two times a day (BID) | ORAL | 0 refills | Status: DC

## 2024-09-30 NOTE — Assessment & Plan Note (Signed)
 She has been off Dupixent for 3 weeks give her Prevnar 20 and flu vaccine today.  Stay off Dupixent for another 2 weeks.

## 2024-09-30 NOTE — Assessment & Plan Note (Signed)
 Not able to characterize the lesion that was seen in her liver on low-dose CT scan of the chest.  Get an MRI with contrast dedicated to the liver to evaluate this further.

## 2024-09-30 NOTE — Assessment & Plan Note (Signed)
 She has acute sinusitis with mucopurulent PND.  She is allergic to penicillin.  Bactrim  double strength 1 p.o. twice daily for 10 days.

## 2024-09-30 NOTE — Progress Notes (Signed)
 Established Patient Office Visit  Subjective   Patient ID: Michelle Conley, female    DOB: [birth year]  Age: 53 y.o. MRN: [id number]  Chief Complaint  Patient presents with   Medical Management of Chronic Issues    HPI Discussed the use of AI scribe software for clinical note transcription with the patient, who gave verbal consent to proceed.  History of Present Illness   Michelle Conley is a 53 year old female with COPD, emphysema, and bronchial asthma who presents with persistent cough and sinus infection symptoms she has a lesion on her liever that showed on a LDCT scan of her lungs.   She has a persistent cough that has not improved since her last visit. She experiences sneezing spells and a low-grade headache, which she manages with nortriptyline , taking 50 mg in the morning and at night if necessary. She avoids ibuprofen  to prevent exacerbating her migraines. She acknowledges her history of smoking, as well as her known COPD, emphysema, and bronchial asthma.  Recently, she visited her family in [place], where her granddaughter contracted the flu from a teacher, leading to illness in the household. She feels she may be developing similar symptoms. She has not received a flu shot this year and needs to coordinate with her Dupixent schedule, which she last took three weeks ago.  She has a 1.2 cm hypodensity lesion in the liver and a 1.3 cm rounded density, likely a lymph node, identified in previous imaging.  She has undergone a low-dose CT of the chest, which was clear for cancer but noted another spot in her liver.    She has a history of colonoscopy with removal of two precancerous polyps.  She experienced a dog-related incident in [place], resulting in bruises and scratches from a pit bull fight. She confirms these are scratches and not bite marks.  The scratches did break the skin, she does not believe she needs rabies shots. She notes easy bruising, which she attributes to  inhaled steroid use.  She reports symptoms of a sinus infection, including throat discomfort and PND.   She experiences fatigue and poor sleep, despite using her CPAP machine for sleep apnea. She denies taking penicillin due to a severe allergic reaction and prefers Bactrim , which she has used successfully in the past for sinus issues.      Objective:     BP 138/84 (BP Location: Left Arm, Patient Position: Sitting, Cuff Size: Normal)   Pulse 91   Temp 98.1 F (36.7 C) (Oral)   Resp 18   Ht 4' 10 (1.473 m)   Wt 153 lb (69.4 kg)   LMP 06/02/2015   SpO2 92%   BMI 31.98 kg/m    Physical Exam Vitals and nursing note reviewed.  Constitutional:      Appearance: Normal appearance.  HENT:     Head: Normocephalic and atraumatic.     Right Ear: Tympanic membrane, ear canal and external ear normal. There is no impacted cerumen.     Left Ear: Tympanic membrane, ear canal and external ear normal. There is no impacted cerumen.     Nose: Congestion present.     Right Turbinates: Swollen.     Left Turbinates: Swollen.     Mouth/Throat:     Pharynx: Oropharyngeal exudate and posterior oropharyngeal erythema present.  Eyes:     Conjunctiva/sclera: Conjunctivae normal.  Cardiovascular:     Rate and Rhythm: Normal rate and regular rhythm.  Pulmonary:     Effort: Pulmonary  effort is normal.     Breath sounds: Normal breath sounds.  Musculoskeletal:     Cervical back: Neck supple. No tenderness.  Lymphadenopathy:     Cervical: No cervical adenopathy.  Skin:    General: Skin is warm and dry.  Neurological:     Mental Status: She is alert and oriented to person, place, and time.  Psychiatric:        Mood and Affect: Mood normal.        Behavior: Behavior normal.        Thought Content: Thought content normal.        Judgment: Judgment normal.          No results found for any visits on 09/30/24.    The ASCVD Risk score (Arnett DK, et al., 2019) failed to calculate for the  following reasons:   Risk score cannot be calculated because patient has a medical history suggesting prior/existing ASCVD    Assessment & Plan:  Lesion of liver -     MR ABDOMEN W CONTRAST; Future  Subacute maxillary sinusitis Assessment & Plan: She has acute sinusitis with mucopurulent PND.  She is allergic to penicillin.  Bactrim  double strength 1 p.o. twice daily for 10 days.  Orders: -     Sulfamethoxazole -Trimethoprim ; Take 1 tablet by mouth 2 (two) times daily.  Dispense: 20 tablet; Refill: 0  Immunization due Assessment & Plan: She has been off Dupixent for 3 weeks give her Prevnar 20 and flu vaccine today.  Stay off Dupixent for another 2 weeks.  Orders: -     Pneumococcal conjugate vaccine 20-valent -     Flu vaccine trivalent PF, 6mos and older(Flulaval,Afluria,Fluarix,Fluzone)  Liver lesion Assessment & Plan: Not able to characterize the lesion that was seen in her liver on low-dose CT scan of the chest.  Get an MRI with contrast dedicated to the liver to evaluate this further.      No follow-ups on file.    Candace Begue K Sherilyn Windhorst, MD

## 2024-10-12 NOTE — Telephone Encounter (Signed)
 This is an error.

## 2024-10-14 ENCOUNTER — Other Ambulatory Visit: Payer: Self-pay

## 2024-10-14 NOTE — Patient Outreach (Signed)
 Complex Care Management   Visit Note  10/14/2024  Name:  Michelle Conley MRN: [id number] DOB: 1971-09-07  Situation: Referral received for Complex Care Management related to Stroke and COPD I obtained verbal consent from Patient.  Visit completed with Patient  on the phone  Background:   Past Medical History:  Diagnosis Date   Abnormal chest xray    Reports that she has spots on her lungs   Anemia    Anxiety    Aortic valve disorder    Aortic valve insufficiency    Asthma    Cervical dysplasia    Complication of anesthesia    reports that she comes out of it slowly   COPD (chronic obstructive pulmonary disease) (HCC)    Cranial nerve palsy    Depression    GERD (gastroesophageal reflux disease)    Goiter    History of kidney stones    History of meningitis    History of viral encephalitis    Hypertension    Hypothyroidism    Migraine headache    Post traumatic stress disorder (PTSD)    Scarlet fever    Shortness of breath dyspnea    Stroke (HCC)    Syncope and collapse     Assessment: Patient Reported Symptoms:  Cognitive Cognitive Status: Normal speech and language skills Cognitive/Intellectual Conditions Management [RPT]: None reported or documented in medical history or problem list   Health Maintenance Behaviors: Annual physical exam Healing Pattern: Average Health Facilitated by: Rest  Neurological Neurological Review of Symptoms: No symptoms reported Neurological Management Strategies: Routine screening, Coping strategies Neurological Self-Management Outcome: 4 (good)  HEENT HEENT Symptoms Reported: Other: (right eye will close on its own) HEENT Management Strategies: Coping strategies, Routine screening HEENT Self-Management Outcome: 4 (good) HEENT Comment: Recent sinus infection    Cardiovascular Cardiovascular Symptoms Reported: No symptoms reported Does patient have uncontrolled Hypertension?: No Cardiovascular Management Strategies: Coping  strategies, Routine screening Weight: 153 lb (69.4 kg) (5 days ago) Cardiovascular Self-Management Outcome: 4 (good)  Respiratory Respiratory Symptoms Reported: No symptoms reported Respiratory Management Strategies: Adequate rest, Coping strategies Respiratory Self-Management Outcome: 4 (good)  Endocrine Endocrine Symptoms Reported: No symptoms reported Is patient diabetic?: No Endocrine Self-Management Outcome: 4 (good)  Gastrointestinal Gastrointestinal Symptoms Reported: No symptoms reported Additional Gastrointestinal Details: IBSC  occassional Gastrointestinal Management Strategies: Adequate rest, Coping strategies Gastrointestinal Self-Management Outcome: 4 (good)    Genitourinary Genitourinary Symptoms Reported: No symptoms reported Genitourinary Management Strategies: Adequate rest, Coping strategies Genitourinary Self-Management Outcome: 4 (good)  Integumentary Integumentary Symptoms Reported: Bruising Additional Integumentary Details: bruises easily nothing new Skin Management Strategies: Coping strategies, Routine screening Skin Self-Management Outcome: 4 (good)  Musculoskeletal Musculoskelatal Symptoms Reviewed: Limited mobility Musculoskeletal Management Strategies: Coping strategies, Medication therapy Musculoskeletal Self-Management Outcome: 4 (good) Falls in the past year?: Yes (no new falls) Number of falls in past year: 2 or more Was there an injury with Fall?: Yes Fall Risk Category Calculator: 3 Patient Fall Risk Level: High Fall Risk Patient at Risk for Falls Due to: History of fall(s) Fall risk Follow up: Falls evaluation completed  Psychosocial Psychosocial Symptoms Reported: No symptoms reported Behavioral Management Strategies: Coping strategies Behavioral Health Self-Management Outcome: 4 (good) Major Change/Loss/Stressor/Fears (CP): Denies Techniques to Cope with Loss/Stress/Change: Medication Quality of Family Relationships: helpful, involved,  supportive Do you feel physically threatened by others?: No    10/14/2024    PHQ2-9 Depression Screening   Little interest or pleasure in doing things Not at all  Feeling down, depressed, or  hopeless Several days (Has new psychiatrist)  PHQ-2 - Total Score 1  Trouble falling or staying asleep, or sleeping too much    Feeling tired or having little energy    Poor appetite or overeating     Feeling bad about yourself - or that you are a failure or have let yourself or your family down    Trouble concentrating on things, such as reading the newspaper or watching television    Moving or speaking so slowly that other people could have noticed.  Or the opposite - being so fidgety or restless that you have been moving around a lot more than usual    Thoughts that you would be better off dead, or hurting yourself in some way    PHQ2-9 Total Score    If you checked off any problems, how difficult have these problems made it for you to do your work, take care of things at home, or get along with other people    Depression Interventions/Treatment Currently on Treatment, Counseling    Today's Vitals   10/14/24 1012  BP: 128/82  Pulse: 98  Weight: 153 lb (69.4 kg)  Height: 4' 10 (1.473 m)   Pain Scale: 0-10 Pain Score: 0-No pain  Medications Reviewed Today     Reviewed by Kay Hendricks MATSU, RN (Case Manager) on 10/14/24 at 1008  Med List Status: <None>   Medication Order Taking? Sig Documenting Provider Last Dose Status Informant  acetaminophen  (TYLENOL ) 500 MG tablet [id number] Yes Take 500 mg by mouth every 8 (eight) hours as needed for headache, mild pain (pain score 1-3) or fever. [provider]  Active Self  albuterol  (VENTOLIN  HFA) 108 (90 Base) MCG/ACT inhaler [id number] Yes Inhale 2 puffs into the lungs every 6 (six) hours as needed for wheezing or shortness of breath. Ziglar, Susan K, MD  Active   alprazolam  (XANAX ) 2 MG tablet [id number] Yes 1 po QID PRN anxiety  Patient  taking differently: Take 2 mg by mouth 3 (three) times daily as needed for anxiety. 1 PO 3X Daily per patient new RX from [facility]   Ziglar, Susan K, MD  Active   aspirin  EC 81 MG tablet [id number] Yes Take 1 tablet (81 mg total) by mouth daily. Swallow whole. Jhonny Calvin NOVAK, MD  Active   atorvastatin  (LIPITOR) 80 MG tablet [id number] Yes Take 1 tablet (80 mg total) by mouth daily. Ziglar, Susan K, MD  Active Self  doxycycline  (VIBRAMYCIN ) 100 MG capsule [id number]  Take 1 capsule (100 mg total) by mouth 2 (two) times daily.  Patient not taking: Reported on 10/14/2024   Ziglar, Susan K, MD  Consider Medication Status and Discontinue   DUPIXENT 300 MG/2ML EMMANUEL [id number] Yes Inject into the skin. [provider]  Active Self  EPINEPHrine  0.3 mg/0.3 mL IJ SOAJ injection [id number] Yes Inject 0.3 mg into the muscle once as needed (allergic reaction). [provider]  Active Self  escitalopram  (LEXAPRO ) 20 MG tablet [id number] Yes Take 20 mg by mouth daily. [provider]  Active Self  fluticasone -salmeterol (ADVAIR DISKUS) 250-50 MCG/ACT AEPB [id number] Yes Inhale 1 puff into the lungs in the morning and at bedtime. Ziglar, Susan K, MD  Active   Fluticasone -Umeclidin-Vilant (TRELEGY ELLIPTA ) 100-62.5-25 MCG/ACT AEPB [id number] Yes Inhale 1 Inhalation into the lungs daily. Ziglar, Susan K, MD  Active Self  furosemide  (LASIX ) 20 MG tablet [id number] Yes Take 1 tablet (20 mg total) by mouth daily. Ziglar, Susan K, MD  Active  guaiFENesin -codeine  100-10 MG/5ML syrup [id number] Yes Take 5 mLs by mouth 3 (three) times daily as needed for cough. Ziglar, Susan K, MD  Active   HYDROcodone -acetaminophen  (NORCO/VICODIN) 5-325 MG tablet [id number] Yes Take 1 tablet by mouth every 8 (eight) hours as needed for moderate pain (pain score 4-6). Ziglar, Susan K, MD  Active   ibuprofen  (ADVIL ) 800 MG tablet [id number] Yes Take 1 tablet (800 mg total) by mouth every 8 (eight) hours as needed for  moderate pain (pain score 4-6). Ziglar, Susan K, MD  Active   ibuprofen  (ADVIL ) 800 MG tablet [id number] Yes Take 1 tablet (800 mg total) by mouth every 8 (eight) hours as needed. Ziglar, Susan K, MD  Active   icosapent  Ethyl (VASCEPA ) 1 g capsule [id number] Yes Take 4 capsules (4 g total) by mouth 2 (two) times daily. Ziglar, Susan K, MD  Active Self  ipratropium-albuterol  (DUONEB) 0.5-2.5 (3) MG/3ML SOLN [id number] Yes Inhale 3 mLs into the lungs every 6 (six) hours as needed. [provider]  Active Self  levothyroxine  (SYNTHROID ) 150 MCG tablet [id number] Yes Take 1 tablet (150 mcg total) by mouth daily. Ziglar, Susan K, MD  Active Self  methocarbamol  (ROBAXIN ) 750 MG tablet [id number] Yes Take 2 tablets (1,500 mg total) by mouth 3 (three) times daily. Ziglar, Susan K, MD  Active   naloxone  (NARCAN ) nasal spray 4 mg/0.1 mL [id number] Yes Call 911. Administer a single spray in one nostril. If no or minimal response after 2 to 3 minutes, an additional dose may be given in the alternate nostril. [provider]  Active Self  nicotine (NICODERM CQ - DOSED IN MG/24 HOURS) 21 mg/24hr patch [id number] Yes Place onto the skin. [provider]  Active Self  nortriptyline  (PAMELOR ) 50 MG capsule [id number] Yes Take 1 capsule (50 mg total) by mouth at bedtime. Ziglar, Susan K, MD  Active   omeprazole  (PRILOSEC) 20 MG capsule [id number] Yes Take 1 capsule (20 mg total) by mouth daily. Ziglar, Susan K, MD  Active   predniSONE  (DELTASONE ) 20 MG tablet [id number] Yes Take 1 tablet (20 mg total) by mouth daily with breakfast. Ziglar, Susan K, MD  Active   promethazine  (PHENERGAN ) 25 MG tablet [id number] Yes Take 1 tablet (25 mg total) by mouth every 6 (six) hours as needed. Ziglar, Susan K, MD  Active   promethazine  (PHENERGAN ) 25 MG tablet [id number] Yes Take 1 tablet (25 mg total) by mouth every 6 (six) hours as needed for nausea or vomiting. Ziglar, Susan K, MD  Active   Spacer/Aero-Holding  Chambers (AEROCHAMBER MV) inhaler [id number] Yes Use as instructed Ziglar, Susan K, MD  Active Self  sulfamethoxazole -trimethoprim  (BACTRIM  DS) 800-160 MG tablet [id number] Yes Take 1 tablet by mouth 2 (two) times daily. Ziglar, Susan K, MD  Active   tiotropium (SPIRIVA ) 18 MCG inhalation capsule [id number] Yes Place 1 capsule (18 mcg total) into inhaler and inhale daily. Ziglar, Susan K, MD  Active   Vitamin D , Ergocalciferol , (DRISDOL ) 1.25 MG (50000 UNIT) CAPS capsule [id number] Yes Take 1 capsule (50,000 Units total) by mouth every 7 (seven) days. Ziglar, Susan K, MD  Active Self           Med Note GAYLENE DARVIN JINNY [place] May 14, 2024 10:36 PM) Pt does not take, because was mistakenly taking 1qd instead of 1qw.             Recommendation:   Continue Current Plan of Care  Follow Up Plan:   Telephone  follow-up in 1 month  Hendricks Her RN, BSN  [place] [facility] RN Case Information Systems Manager [phone]

## 2024-10-14 NOTE — Patient Instructions (Signed)
 Visit Information  Michelle Conley was given information about Medicaid Managed Care team care coordination services as a part of their [place] Medicaid benefit.   If you would like to schedule transportation through your [place] plan, please call the following number at least 2 days in advance of your appointment: [phone].   There is no limit to the number of trips during the year between medical appointments, healthcare facilities, or pharmacies. Transportation must be scheduled at least 2 business days before but not more than thirty 30 days before of your appointment.  Call the Behavioral Health Crisis Line at [phone], at any time, 24 hours a day, 7 days a week. If you are in danger or need immediate medical attention call 911.   Please see education materials related to Stroke and COPD  provided by MyChart link.  Care plan and visit instructions communicated with the patient verbally today. Patient agrees to receive a copy in MyChart. Active MyChart status and patient understanding of how to access instructions and care plan via MyChart confirmed with patient.     Telephone follow up appointment with Managed Medicaid care management team member scheduled for:11-16-2024 at 9:30 am   Hendricks Her RN, BSN  [place] [facility]Population Health RN Case Manager   Direct [phone]   Following is a copy of your plan of care:   Goals Addressed             This Visit's Progress    VBCI RN Care Plan   On track    Problems:  Chronic Disease Management support and education needs related to Stroke   Goal: Over the next 90 days the Patient will attend all scheduled medical appointments: with providers as evidenced by appointment encounter notes in EMR  Update 09-14-2024. Patient missed appointment due to Daughter having baby and needing to be there to assists      Update: 12-10 Patient has not missed any appointments  collaborate with the care  management team towards completion of advanced directives  as evidenced by advanced directives completed and uploaded into EMR   continue to work with RN Care Manager and/or Social Worker to address care management and care coordination needs related to Stroke as evidenced by adherence to care management team scheduled appointments     not experience hospital admission as evidenced by review of electronic medical record. Hospital Admissions in last 6 months = 1 take all medications exactly as prescribed and will call provider for medication related questions as evidenced by communication with Provider with concerns or questions regarding medications   Update 09-14-2024 two medications needing refills- Patient has contacted provider   verbalize understanding of plan for management of Stroke  as evidenced by explaining in own words the importance of monitoring blood pressure, knowing signs and symptoms and adhering to medication regimen  Interventions:   Stroke: Reviewed Importance of taking all medications as prescribed Reviewed Importance of attending all scheduled provider appointments Advised to report any changes in symptoms or exercise tolerance Screening for signs and symptoms of depression related to chronic disease state Assessed social determinant of health barriers Assessed for signs and symptoms of stroke Assessed for management of bladder and/or bowel incontinence Assessed for cognitive impairment Assessed for fall status and safety in the home Assessed use of tobacco use Request sent to Provider via Inboxfor referral to pain clinic per patient request  UPDATE: 08-18-2024  Referral sent to [facility] pain clinic. RN called patient to inform her. UPDATE:  09-14-2024 Patient states she has not heard yet- Will call and follow up  UPDATE: 10-14-2024 Patient did not each out to pain clinic and states she is doing ok  Without reaching out.  Patient Self-Care Activities:  Attend all scheduled  provider appointments Call pharmacy for medication refills 3-7 days in advance of running out of medications Call provider office for new concerns or questions  Notify RN Care Manager of [facility] call rescheduling needs Take medications as prescribed   Take Daily Weights and record Update 09-14-2024  patient states she will get back on track with daily weights  Take Daily BP and record Update 09-14-2024 patient states she will get back on track with daily BP- Daughter recently had a baby and patient has been busy assisting     Telephone follow up appointment with care management team member scheduled for:  11-16-2024 at 9:30 AM           VBCI RN Care Plan   On track    Problems:  Chronic Disease Management support and education needs related to COPD  Goal: Over the next 90 days the Patient will attend all scheduled medical appointments: with providers as evidenced by encounter notes in EMR         continue to work with RN Care Manager and/or Social Worker to address care management and care coordination needs related to COPD as evidenced by adherence to care management team scheduled appointments     demonstrate a decrease COPD in exacerbations as evidenced by verbalizing that breathing and coughing have  improved take all medications exactly as prescribed and will call provider for medication related questions as evidenced by communication with provider from any medication related question or concerns     verbalize basic understanding of COPD disease process and self health management plan as evidenced by explaining in own words what the COPD emergency management plan is  Call provider with concerns of expectorant with greenish color (09-14-2024)  as evidenced by telephone call notes and/or provider appointment scheduled Update 10-14-2024  Patient went to Provider and had infection- ABX prescribed- Patient has improved   Interventions:   COPD Interventions: Advised patient to track and  manage COPD triggers Advised patient to self assesses COPD action plan zone and make appointment with provider if in the yellow zone for 48 hours without improvement Assessed social determinant of health barriers Discussed the importance of adequate rest and management of fatigue with COPD Provided education about and advised patient to utilize infection prevention strategies to reduce risk of respiratory infection Provided instruction about proper use of medications used for management of COPD including inhalers Provided patient with basic written and verbal COPD education on self care/management/and exacerbation prevention Screening for signs and symptoms of depression related to chronic disease state   Patient Self-Care Activities:  eliminate smoking in my home identify and remove indoor air pollutants do breathing exercises every day begin a symptom diary develop a rescue plan eliminate symptom triggers at home follow rescue plan if symptoms flare-up keep follow-up appointments: with provider get at least 7 to 8 hours of sleep at night use devices that will help like a cane, sock-puller or reacher do breathing exercises every day weigh daily and record  Take BP daily and record   Plan:  Telephone follow up appointment with care management team member scheduled for:  11-16-2024 at 9:30 AM

## 2024-10-15 ENCOUNTER — Other Ambulatory Visit: Payer: Self-pay

## 2024-10-15 ENCOUNTER — Telehealth: Payer: Self-pay

## 2024-10-15 DIAGNOSIS — E039 Hypothyroidism, unspecified: Secondary | ICD-10-CM

## 2024-10-15 MED ORDER — LEVOTHYROXINE SODIUM 150 MCG PO TABS: 150.0000 ug | ORAL_TABLET | Freq: Every day | ORAL | 3 refills | Status: AC

## 2024-10-15 NOTE — Telephone Encounter (Signed)
 Please call and schedule appointment if she wants to discuss this. We support phyciatry usually but if she wants to discuss med she needs appt. Thanks    Copied from CRM [id number]. Topic: Clinical - Medication Question >> Oct 15, 2024 11:09 AM Ivette P wrote: Reason for CRM: Pt would like to speak to primary about a medication that her psychiatrist wants to put her on  Defacode - generic version divalproexsoder - 250mg   Pt was on before for other issues but would like primary's thought or perspective on this and would like to discuss further.    Pls follow up with pt. -  [phone]

## 2024-10-16 ENCOUNTER — Other Ambulatory Visit: Payer: Self-pay | Admitting: Family Medicine

## 2024-10-16 DIAGNOSIS — R110 Nausea: Secondary | ICD-10-CM

## 2024-10-16 NOTE — Telephone Encounter (Unsigned)
 Copied from CRM #[id number]. Topic: Clinical - Medication Refill >> Oct 16, 2024 10:14 AM Nathanel BROCKS wrote: Medication: promethazine  (PHENERGAN ) 25 MG tablet  Has the patient contacted their pharmacy? Yes   This is the patient's preferred pharmacy:  [facility] [address] [place] - [address] [place] [address] Phone: [phone] Fax: [phone]  Is this the correct pharmacy for this prescription? Yes If no, delete pharmacy and type the correct one.   Has the prescription been filled recently? Yes  Is the patient out of the medication? Yes  Has the patient been seen for an appointment in the last year OR does the patient have an upcoming appointment? Yes  Can we respond through MyChart? Yes  Agent: Please be advised that Rx refills may take up to 3 business days. We ask that you follow-up with your pharmacy.

## 2024-10-21 MED ORDER — PROMETHAZINE HCL 25 MG PO TABS: 25.0000 mg | ORAL_TABLET | Freq: Four times a day (QID) | ORAL | 0 refills | Status: DC | PRN

## 2024-11-09 ENCOUNTER — Ambulatory Visit: Payer: Self-pay

## 2024-11-09 ENCOUNTER — Ambulatory Visit: Admitting: Family Medicine

## 2024-11-09 NOTE — Telephone Encounter (Signed)
 Patient disconnected call prior to warm transfer. Nurse will attempt to call patient back.

## 2024-11-09 NOTE — Telephone Encounter (Signed)
 Contacted pt and informed her that Dr. Ziglar stated she needed to come into the office so that she would be able to evaluate her and listen to  her lungs.  Pt stated that she did not want to come in today but that she could come tomorrow, informed her that there were no openings for the next few days. She could go to UC if she feels like she needs to be seen.

## 2024-11-09 NOTE — Telephone Encounter (Signed)
 FYI Only or Action Required?: Action required by provider: request for appointment and clinical question for provider.  Patient was last seen in primary care on 09/30/2024 by Ziglar, Susan K, MD.  Called Nurse Triage reporting Nasal Congestion.  Symptoms began today.  Interventions attempted: Nothing.  Symptoms are: unchanged.  Triage Disposition: See Physician Within 24 Hours  Patient/caregiver understands and will follow disposition?: No, wishes to speak with PCP   Copied from CRM #[id number]. Topic: Clinical - Red Word Triage >> Nov 09, 2024  9:19 AM Adelita E wrote: [facility] that prompted transfer to Nurse Triage: Head pain, productive cough with yellow/brown phlegm. Going on for 3 days. Coughing leads to chest pain. Reason for Disposition  [1] Sinus pain (not just congestion) AND [2] fever  Answer Assessment - Initial Assessment Questions Patient has appt already scheduled 11/10/24 at 1:30 PM; patient requesting changed to video visit.  Advised call back if symptoms worsen.  1. LOCATION: Where does it hurt?      Sinus drainage, under eyes and nose, coughing not better, still same 2. ONSET: When did the sinus pain start?  (e.g., hours, days)      Month ago 3. SEVERITY: How bad is the pain?   (Scale 0-10; or none, mild, moderate or severe)     Mild when coughing 5. NASAL CONGESTION: Is the nose blocked? If Yes, ask: Can you open it or must you breathe through your mouth?     yes 6. NASAL DISCHARGE: Do you have discharge from your nose? If so ask, What color?    Off white, yellow 7. FEVER: Do you have a fever? If Yes, ask: What is it, how was it measured, and when did it start?      Nausea, chills, denies fever, vomiting 8. OTHER SYMPTOMS: Do you have any other symptoms? (e.g., sore throat, cough, earache, difficulty breathing)  Body aches   Denies diff breathing, chest pain, faint  Protocols used: Sinus Pain or Congestion-A-AH

## 2024-11-16 ENCOUNTER — Telehealth: Payer: Self-pay

## 2024-11-16 NOTE — Patient Instructions (Signed)
 [name] - I am sorry I was unable to reach you today for our scheduled appointment. I work with Ziglar, Susan K, MD and am calling to support your healthcare needs. Please contact me at [phone] at your earliest convenience. I look forward to speaking with you soon.   Thank you,  Hendricks Her RN, BSN  [place] [facility]BCI-Population Health RN Case Manager   Direct [phone]

## 2024-11-17 ENCOUNTER — Telehealth: Payer: Self-pay

## 2024-11-17 NOTE — Patient Instructions (Signed)
 [name] - I am sorry I was unable to reach you today for our scheduled appointment. I work with Ziglar, Susan K, MD and am calling to support your healthcare needs. Please contact me at [phone] at your earliest convenience. I look forward to speaking with you soon.   Thank you,  Hendricks Her RN, BSN  [place] [facility]BCI-Population Health RN Case Manager   Direct [phone]

## 2024-11-19 ENCOUNTER — Telehealth: Payer: Self-pay

## 2024-11-19 ENCOUNTER — Other Ambulatory Visit: Payer: Self-pay

## 2024-11-19 NOTE — Telephone Encounter (Signed)
 Copied from CRM [id number]. Topic: Clinical - Prescription Issue >> Nov 19, 2024 11:29 AM Rea ORN wrote: Reason for CRM: Pt stated PCP changed the dispense qty on promethazine  (PHENERGAN ) 25 MG tablet form 60 to 30 tablet. Pt is asking for PCP order the additional 30 tablets because she uses them. Pt also wanted PCP to know that she does have an appt scheduled with pain management in February.    Please call back to advise,  [phone]

## 2024-11-19 NOTE — Patient Instructions (Signed)
 [name] - I have attempted to call you three times but have been unsuccessful in reaching you. I work with Ziglar, Susan K, MD and am calling to support your healthcare needs. If I can be of assistance to you, please contact me at [phone].     Thank you,  Hendricks Her RN, BSN  [place] [facility] VBCI-Population Health RN Case Manager   Direct [phone]

## 2024-11-23 ENCOUNTER — Other Ambulatory Visit: Payer: Self-pay | Admitting: Family Medicine

## 2024-11-23 DIAGNOSIS — R110 Nausea: Secondary | ICD-10-CM

## 2024-11-23 MED ORDER — PROMETHAZINE HCL 25 MG PO TABS: 25.0000 mg | ORAL_TABLET | Freq: Four times a day (QID) | ORAL | 2 refills | Status: AC | PRN

## 2024-11-24 ENCOUNTER — Telehealth: Payer: Self-pay | Admitting: *Deleted

## 2024-11-24 NOTE — Telephone Encounter (Signed)
 Copied from CRM [id number]. Topic: Clinical - Medication Question >> Nov 24, 2024  2:08 PM Tonda B wrote: Reason for CRM: PATIENT HAS QUESTIONS ABOUT HER RX  promethazine  (PHENERGAN ) 25 MG tablet PLEASE CALL PT BACK  [phone]   [phone]

## 2024-11-24 NOTE — Telephone Encounter (Signed)
 LVM for pt to call office to ask about below.

## 2024-11-25 ENCOUNTER — Ambulatory Visit: Admitting: Family Medicine

## 2024-11-25 ENCOUNTER — Encounter: Payer: Self-pay | Admitting: Family Medicine

## 2024-11-25 VITALS — BP 119/77 | HR 88 | Temp 98.3°F | Resp 16 | Ht <= 58 in | Wt 154.8 lb

## 2024-11-25 DIAGNOSIS — R7303 Prediabetes: Secondary | ICD-10-CM | POA: Diagnosis not present

## 2024-11-25 DIAGNOSIS — E782 Mixed hyperlipidemia: Secondary | ICD-10-CM | POA: Diagnosis not present

## 2024-11-25 DIAGNOSIS — R110 Nausea: Secondary | ICD-10-CM

## 2024-11-25 DIAGNOSIS — I10 Essential (primary) hypertension: Secondary | ICD-10-CM

## 2024-11-25 DIAGNOSIS — K219 Gastro-esophageal reflux disease without esophagitis: Secondary | ICD-10-CM | POA: Diagnosis not present

## 2024-11-25 DIAGNOSIS — J441 Chronic obstructive pulmonary disease with (acute) exacerbation: Secondary | ICD-10-CM

## 2024-11-25 DIAGNOSIS — E039 Hypothyroidism, unspecified: Secondary | ICD-10-CM | POA: Diagnosis not present

## 2024-11-25 DIAGNOSIS — J418 Mixed simple and mucopurulent chronic bronchitis: Secondary | ICD-10-CM | POA: Diagnosis not present

## 2024-11-25 MED ORDER — ALBUTEROL SULFATE HFA 108 (90 BASE) MCG/ACT IN AERS: 2.0000 | INHALATION_SPRAY | Freq: Four times a day (QID) | RESPIRATORY_TRACT | 5 refills | Status: AC | PRN

## 2024-11-25 MED ORDER — OMEPRAZOLE 20 MG PO CPDR: 20.0000 mg | DELAYED_RELEASE_CAPSULE | Freq: Every day | ORAL | 1 refills | Status: AC

## 2024-11-25 MED ORDER — FUROSEMIDE 20 MG PO TABS: 20.0000 mg | ORAL_TABLET | Freq: Every day | ORAL | 1 refills | Status: AC

## 2024-11-25 MED ORDER — ATORVASTATIN CALCIUM 80 MG PO TABS: 80.0000 mg | ORAL_TABLET | Freq: Every day | ORAL | 3 refills | Status: AC

## 2024-11-25 MED ORDER — GUAIFENESIN-CODEINE 100-10 MG/5ML PO SOLN: 5.0000 mL | Freq: Three times a day (TID) | ORAL | 0 refills | Status: AC | PRN

## 2024-11-26 ENCOUNTER — Ambulatory Visit: Payer: Self-pay | Admitting: Family Medicine

## 2024-11-26 ENCOUNTER — Telehealth: Payer: Self-pay | Admitting: Family Medicine

## 2024-11-26 DIAGNOSIS — R110 Nausea: Secondary | ICD-10-CM | POA: Insufficient documentation

## 2024-11-26 LAB — LIPID PANEL
Chol/HDL Ratio: 5.1 ratio — ABNORMAL HIGH (ref 0.0–4.4)
Cholesterol, Total: 238 mg/dL — ABNORMAL HIGH (ref 100–199)
HDL: 47 mg/dL
LDL Chol Calc (NIH): 142 mg/dL — ABNORMAL HIGH (ref 0–99)
Triglycerides: 269 mg/dL — ABNORMAL HIGH (ref 0–149)
VLDL Cholesterol Cal: 49 mg/dL — ABNORMAL HIGH (ref 5–40)

## 2024-11-26 LAB — COMPREHENSIVE METABOLIC PANEL WITH GFR
ALT: 20 IU/L (ref 0–32)
AST: 24 IU/L (ref 0–40)
Albumin: 4.7 g/dL (ref 3.8–4.9)
Alkaline Phosphatase: 115 IU/L (ref 49–135)
BUN/Creatinine Ratio: 11 (ref 9–23)
BUN: 11 mg/dL (ref 6–24)
Bilirubin Total: 0.3 mg/dL (ref 0.0–1.2)
CO2: 22 mmol/L (ref 20–29)
Calcium: 9.2 mg/dL (ref 8.7–10.2)
Chloride: 98 mmol/L (ref 96–106)
Creatinine, Ser: 0.98 mg/dL (ref 0.57–1.00)
Globulin, Total: 2.4 g/dL (ref 1.5–4.5)
Glucose: 81 mg/dL (ref 70–99)
Potassium: 4.3 mmol/L (ref 3.5–5.2)
Sodium: 136 mmol/L (ref 134–144)
Total Protein: 7.1 g/dL (ref 6.0–8.5)
eGFR: 69 mL/min/1.73

## 2024-11-26 LAB — CBC WITH DIFFERENTIAL/PLATELET
Basophils Absolute: 0.1 x10E3/uL (ref 0.0–0.2)
Basos: 1 %
EOS (ABSOLUTE): 0.1 x10E3/uL (ref 0.0–0.4)
Eos: 1 %
Hematocrit: 44.9 % (ref 34.0–46.6)
Hemoglobin: 14.9 g/dL (ref 11.1–15.9)
Immature Grans (Abs): 0.1 x10E3/uL (ref 0.0–0.1)
Immature Granulocytes: 1 %
Lymphocytes Absolute: 3.6 x10E3/uL — ABNORMAL HIGH (ref 0.7–3.1)
Lymphs: 39 %
MCH: 29.9 pg (ref 26.6–33.0)
MCHC: 33.2 g/dL (ref 31.5–35.7)
MCV: 90 fL (ref 79–97)
Monocytes Absolute: 0.4 x10E3/uL (ref 0.1–0.9)
Monocytes: 4 %
Neutrophils Absolute: 5.1 x10E3/uL (ref 1.4–7.0)
Neutrophils: 54 %
Platelets: 300 x10E3/uL (ref 150–450)
RBC: 4.98 x10E6/uL (ref 3.77–5.28)
RDW: 13.4 % (ref 11.7–15.4)
WBC: 9.4 x10E3/uL (ref 3.4–10.8)

## 2024-11-26 LAB — TSH: TSH: 164 u[IU]/mL — ABNORMAL HIGH (ref 0.450–4.500)

## 2024-11-26 NOTE — Assessment & Plan Note (Signed)
 Checking her TSH and free T4

## 2024-11-26 NOTE — Telephone Encounter (Signed)
 Spoke with patient and has not changed anything.  She is taking her levothyroxine  and atorvastatin  as usual.  She does take other medication with the Levothyroxine .  She is not on Biotin.  Her TSH is currently 164.000 up from normal range at last check.  We will recheck your thyroid  studies and your cholesterol in 8 weeks

## 2024-11-26 NOTE — Assessment & Plan Note (Signed)
 Reports that she stays nauseated all the time and takes omeprazole  20 mg a day and promethazine  25 mg up to 4 times a day.

## 2024-11-26 NOTE — Assessment & Plan Note (Signed)
 Agreed to give her a bottle of cough syrup today but advised that I will not fill this again.  She does not need to be taking codeine  routinely.
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
## 2024-11-27 NOTE — Patient Instructions (Signed)
 [name] - I am sorry I was unable to reach you today. I work with Ziglar, Susan K, MD and am calling to support your healthcare needs. I have scheduled you for a telephone call on Monday 11/30/2024 at 1045 AM . If this is not convenient, please call me directly.  I look forward to speaking with you and continuing to work with you. Stay warm   Thank you,  Hendricks Her RN, BSN  [place] [facility]BCI-Population Health RN Case Manager   Direct [phone]

## 2024-11-30 ENCOUNTER — Other Ambulatory Visit: Payer: Self-pay

## 2024-11-30 NOTE — Patient Instructions (Signed)
 Visit Information  Michelle Conley was given information about Medicaid Managed Care team care coordination services as a part of their [place] benefit.   If you would like to schedule transportation through your [place] plan, please call the following number at least 2 days in advance of your appointment: [phone].   There is no limit to the number of trips during the year between medical appointments, healthcare facilities, or pharmacies. Transportation must be scheduled at least 2 business days before but not more than thirty 30 days before of your appointment.  Call the Behavioral Health Crisis Line at [phone], at any time, 24 hours a day, 7 days a week. If you are in danger or need immediate medical attention call 911.   Please see education materials related to COPD, Smoking Cessation, Stroke and Advanced Directive provided by MyChart link.  Care plan and visit instructions communicated with the patient verbally today. Patient agrees to receive a copy in MyChart. Active MyChart status and patient understanding of how to access instructions and care plan via MyChart confirmed with patient.     Telephone follow up appointment with Managed Medicaid care management team member scheduled for: 12-15-2024 at 10:00 AM   Hendricks Her RN, BSN  [place] [facility]-Population Health RN Case Manager   Direct [phone]   Following is a copy of your plan of care:   Goals Addressed             This Visit's Progress    VBCI RN Care Plan   No change    Problems:  Chronic Disease Management support and education needs related to Stroke   UPDATED GOAL 11/30/2024  Goal: Over the next 90 days the Patient will attend all scheduled medical appointments: with providers as evidenced by appointment encounter notes in EMR  Update 09-14-2024. Patient missed appointment due to Daughter having baby and needing to be there to assists      Update: 12-10  Patient has not missed any appointments Update 11-30-2024: Patient has not missed any Provider appointments- We discussed importance of VBCI program and support offered and importance of being available for future follow up VBCI telephonic appointments. Patient verbalized understanding and desires to re-enroll and continue with VBCI program.   collaborate with the care management team towards completion of advanced directives  as evidenced by advanced directives completed and uploaded into EMR   continue to work with RN Care Manager and/or Social Worker to address care management and care coordination needs related to Stroke as evidenced by adherence to care management team scheduled appointments     not experience hospital admission as evidenced by review of electronic medical record. Hospital Admissions in last 6 months = 1 Update: 11-30-2024  No hospital admissions in last recent 6 months take all medications exactly as prescribed and will call provider for medication related questions as evidenced by communication with Provider with concerns or questions regarding medications   Update 09-14-2024 two medications needing refills- patient has contacted provider  Update 11-30-2024 : Patient had recent provider refill of cough medicine with codeine . Patient was made aware by provider that she will not receive any further refills verbalize understanding of plan for management of Stroke  as evidenced by explaining in own words the importance of monitoring blood pressure, knowing signs and symptoms and adhering to medication regimen.  Interventions:  Stroke: Reviewed Importance of taking all medications as prescribed Reviewed Importance of attending all scheduled provider appointments Advised to report any  changes in symptoms or exercise tolerance Screening for signs and symptoms of depression related to chronic disease state Assessed social determinant of health barriers Assessed for signs and symptoms of  stroke Assessed for management of bladder and/or bowel incontinence Assessed for cognitive impairment Assessed for fall status and safety in the home Assessed use of tobacco use Request sent to Provider via Inboxfor referral to pain clinic per patient request  UPDATE: 08-18-2024  Referral sent to [facility] pain clinic. RN called patient to inform her. UPDATE: 09-14-2024 Patient states she has not heard yet- Will call and follow up  UPDATE: 10-14-2024 Patient did not each out to pain clinic and states she is doing ok  Without reaching out.  Patient Self-Care Activities:  Attend all scheduled provider appointments Call pharmacy for medication refills 3-7 days in advance of running out of medications Call provider office for new concerns or questions  Notify RN Care Manager of [facility] 2 call rescheduling needs Take medications as prescribed   Take Daily Weights and record Update 09-14-2024  patient states she will get back on track with daily weights Update: 11-30-2024  Patient verbalized not taking daily weights but will get Back on track  Take Daily BP and record Update 09-14-2024 patient states she will get back on track with daily BP- Daughter recently had a baby and patient has been busy assisting Update 11-30-2024. Patient verbalized not taking home BP . Stated she did get new batteries but Daughter took them for patient's grandchildren's toys. Patient states she will get new batteries after weather clears and take BP daily and record.      Telephone follow up appointment with care management team member scheduled for:  12-15-2024 at 10:00 AM           VBCI RN Care Plan   On track    Problems:  Chronic Disease Management support and education needs related to COPD  Goal: Over the next 90 days the Patient will attend all scheduled medical appointments: with providers as evidenced by encounter notes in EMR         continue to work with RN Care Manager and/or Social Worker to address care  management and care coordination needs related to COPD as evidenced by adherence to care management team scheduled appointments     demonstrate a decrease COPD in exacerbations as evidenced by verbalizing that breathing and coughing have  improved take all medications exactly as prescribed and will call provider for medication related questions as evidenced by communication with provider from any medication related question or concerns     verbalize basic understanding of COPD disease process and self health management plan as evidenced by explaining in own words what the COPD emergency management plan is  Call provider with concerns of expectorant with greenish color (09-14-2024)  as evidenced by telephone call notes and/or provider appointment scheduled Update 10-14-2024  Patient went to Provider and had infection- ABX prescribed- Patient has improved   Interventions:   COPD Interventions: UPDATE 11-30-2024 Advised patient to track and manage COPD triggers Advised patient to self assesses COPD action plan zone and make appointment with provider if in the yellow zone for 48 hours without improvement Assessed social determinant of health barriers Discussed the importance of adequate rest and management of fatigue with COPD Provided education about and advised patient to utilize infection prevention strategies to reduce risk of respiratory infection Provided instruction about proper use of medications used for management of COPD including inhalers Provided patient with basic  written and verbal COPD education on self care/management/and exacerbation prevention Screening for signs and symptoms of depression related to chronic disease state   UPDATE: 11-30-2024  Discussed importance of quitting smoking.  Patient states she is  not ready totally to quit. She stated that she has cut back . Smoking cessation information will be attached to AVS   Patient Self-Care Activities:  eliminate smoking in my  home identify and remove indoor air pollutants do breathing exercises every day begin a symptom diary develop a rescue plan eliminate symptom triggers at home follow rescue plan if symptoms flare-up keep follow-up appointments: with provider get at least 7 to 8 hours of sleep at night use devices that will help like a cane, sock-puller or reacher do breathing exercises every day weigh daily and record  Take BP daily and record   Plan:  Telephone follow up appointment with care management team member scheduled for:  12-15-2024 at 10:00  AM

## 2024-11-30 NOTE — Patient Outreach (Signed)
 Complex Care Management   Visit Note  11/30/2024  Name:  Michelle Conley MRN: [id number] DOB: 03-31-1971  Situation: Referral received for Complex Care Management related to Stroke and COPD I obtained verbal consent from Patient.  Visit completed with Patient  on the phone  Background:   Past Medical History:  Diagnosis Date   Abnormal chest xray    Reports that she has spots on her lungs   Anemia    Anxiety    Aortic valve disorder    Aortic valve insufficiency    Asthma    Cervical dysplasia    Complication of anesthesia    reports that she comes out of it slowly   COPD (chronic obstructive pulmonary disease) (HCC)    Cranial nerve palsy    Depression    GERD (gastroesophageal reflux disease)    Goiter    History of kidney stones    History of meningitis    History of viral encephalitis    Hypertension    Hypothyroidism    Migraine headache    Post traumatic stress disorder (PTSD)    Scarlet fever    Shortness of breath dyspnea    Stroke (HCC)    Syncope and collapse     Assessment: Patient Reported Symptoms:  Cognitive Cognitive Status: Normal speech and language skills   Health Maintenance Behaviors: Annual physical exam Healing Pattern: Slow Health Facilitated by: Rest, Stress management  Neurological Neurological Review of Symptoms: No symptoms reported Neurological Management Strategies: Coping strategies Neurological Self-Management Outcome: 4 (good)  HEENT HEENT Symptoms Reported: Nasal discharge HEENT Management Strategies: Adequate rest, Coping strategies HEENT Self-Management Outcome: 4 (good)    Cardiovascular Cardiovascular Symptoms Reported: No symptoms reported Does patient have uncontrolled Hypertension?: No Cardiovascular Management Strategies: Coping strategies, Routine screening Weight: 154 lb (69.9 kg) Cardiovascular Self-Management Outcome: 4 (good)  Respiratory Respiratory Symptoms Reported: Dry cough, Productive cough Other  Respiratory Symptoms: Sometimes phlegm will come up/ sometimes a dry Smokers Cough  No color to phlegm Respiratory Management Strategies: Adequate rest, Medication therapy, Coping strategies  Endocrine Endocrine Symptoms Reported: No symptoms reported Is patient diabetic?: No Endocrine Self-Management Outcome: 4 (good)  Gastrointestinal Gastrointestinal Symptoms Reported: No symptoms reported Gastrointestinal Management Strategies: Adequate rest Gastrointestinal Self-Management Outcome: 4 (good)    Genitourinary Genitourinary Symptoms Reported: No symptoms reported Genitourinary Management Strategies: Adequate rest Genitourinary Self-Management Outcome: 4 (good)  Integumentary Integumentary Symptoms Reported: No symptoms reported Skin Management Strategies: Routine screening Skin Self-Management Outcome: 4 (good)  Musculoskeletal Musculoskelatal Symptoms Reviewed: No symptoms reported Musculoskeletal Management Strategies: Coping strategies, Medical device Musculoskeletal Self-Management Outcome: 4 (good) Falls in the past year?: Yes Number of falls in past year: 2 or more Was there an injury with Fall?: No (Rolled ankle) Fall Risk Category Calculator: 2 Patient Fall Risk Level: Moderate Fall Risk Patient at Risk for Falls Due to: History of fall(s), Impaired balance/gait, Other (Comment) (Ankle rolls a lot)  Psychosocial Psychosocial Symptoms Reported: No symptoms reported     Quality of Family Relationships: helpful, involved, supportive Do you feel physically threatened by others?: No    11/30/2024    PHQ2-9 Depression Screening   Little interest or pleasure in doing things Not at all  Feeling down, depressed, or hopeless Several days  PHQ-2 - Total Score 1  Trouble falling or staying asleep, or sleeping too much    Feeling tired or having little energy    Poor appetite or overeating     Feeling bad about yourself - or that  you are a failure or have let yourself or your  family down    Trouble concentrating on things, such as reading the newspaper or watching television    Moving or speaking so slowly that other people could have noticed.  Or the opposite - being so fidgety or restless that you have been moving around a lot more than usual    Thoughts that you would be better off dead, or hurting yourself in some way    PHQ2-9 Total Score    If you checked off any problems, how difficult have these problems made it for you to do your work, take care of things at home, or get along with other people    Depression Interventions/Treatment      Today's Vitals   11/30/24 1114  Weight: 154 lb (69.9 kg)   Pain Scale: 0-10 Pain Score: 0-No pain  Medications Reviewed Today     Reviewed by Kay Hendricks MATSU, RN (Case Manager) on 11/30/24 at 1105  Med List Status: <None>   Medication Order Taking? Sig Documenting Provider Last Dose Status Informant  acetaminophen  (TYLENOL ) 500 MG tablet [id number] Yes Take 500 mg by mouth every 8 (eight) hours as needed for headache, mild pain (pain score 1-3) or fever. [provider]  Active Self  albuterol  (VENTOLIN  HFA) 108 (90 Base) MCG/ACT inhaler [id number] Yes Inhale 2 puffs into the lungs every 6 (six) hours as needed for wheezing or shortness of breath. Ziglar, Susan K, MD  Active   alprazolam  (XANAX ) 2 MG tablet [id number] Yes 1 po QID PRN anxiety  Patient taking differently: Take 0.5 mg by mouth daily.   Ziglar, Susan K, MD  Active   aspirin  EC 81 MG tablet [id number] Yes Take 1 tablet (81 mg total) by mouth daily. Swallow whole. Jhonny Calvin NOVAK, MD  Active   atorvastatin  (LIPITOR) 80 MG tablet [id number] Yes Take 1 tablet (80 mg total) by mouth daily. Ziglar, Susan K, MD  Active   DUPIXENT 300 MG/2ML EMMANUEL [id number]  Inject into the skin.  Patient not taking: Reported on 11/30/2024   [provider]  Consider Medication Status and Discontinue Self  EPINEPHrine  0.3 mg/0.3 mL IJ SOAJ injection [id number]  Yes Inject 0.3 mg into the muscle once as needed (allergic reaction). [provider]  Active Self  escitalopram  (LEXAPRO ) 20 MG tablet [id number] Yes Take 20 mg by mouth daily. [provider]  Active Self  fluticasone -salmeterol (ADVAIR DISKUS) 250-50 MCG/ACT AEPB [id number] Yes Inhale 1 puff into the lungs in the morning and at bedtime. Ziglar, Susan K, MD  Active   furosemide  (LASIX ) 20 MG tablet [id number] Yes Take 1 tablet (20 mg total) by mouth daily. Ziglar, Susan K, MD  Active   guaiFENesin -codeine  100-10 MG/5ML syrup [id number] Yes Take 5 mLs by mouth 3 (three) times daily as needed for cough. Ziglar, Susan K, MD  Active   ibuprofen  (ADVIL ) 800 MG tablet [id number] Yes Take 1 tablet (800 mg total) by mouth every 8 (eight) hours as needed for moderate pain (pain score 4-6). Ziglar, Susan K, MD  Active   icosapent  Ethyl (VASCEPA ) 1 g capsule [id number] Yes Take 4 capsules (4 g total) by mouth 2 (two) times daily. Ziglar, Susan K, MD  Active Self  ipratropium-albuterol  (DUONEB) 0.5-2.5 (3) MG/3ML SOLN [id number] Yes Inhale 3 mLs into the lungs every 6 (six) hours as needed. [provider]  Active Self  levothyroxine  (SYNTHROID ) 150 MCG tablet [id number] Yes Take 1 tablet (150 mcg  total) by mouth daily. Ziglar, Susan K, MD  Active   methocarbamol  (ROBAXIN ) 750 MG tablet [id number] Yes Take 2 tablets (1,500 mg total) by mouth 3 (three) times daily. Ziglar, Susan K, MD  Active   naloxone  (NARCAN ) nasal spray 4 mg/0.1 mL [id number] Yes Call 911. Administer a single spray in one nostril. If no or minimal response after 2 to 3 minutes, an additional dose may be given in the alternate nostril. [provider]  Active Self  nicotine (NICODERM CQ - DOSED IN MG/24 HOURS) 21 mg/24hr patch [id number]  Place onto the skin.  Patient not taking: Reported on 11/30/2024   [provider]  Consider Medication Status and Discontinue Self  nortriptyline  (PAMELOR ) 50 MG capsule  [id number] Yes Take 1 capsule (50 mg total) by mouth at bedtime. Ziglar, Susan K, MD  Active   omeprazole  (PRILOSEC) 20 MG capsule [id number] Yes Take 1 capsule (20 mg total) by mouth daily. Ziglar, Susan K, MD  Active   promethazine  (PHENERGAN ) 25 MG tablet [id number] Yes Take 1 tablet (25 mg total) by mouth every 6 (six) hours as needed for nausea or vomiting. Ziglar, Susan K, MD  Active   Spacer/Aero-Holding Chambers (AEROCHAMBER MV) inhaler [id number] Yes Use as instructed Ziglar, Susan K, MD  Active Self  tiotropium (SPIRIVA ) 18 MCG inhalation capsule [id number] Yes Place 1 capsule (18 mcg total) into inhaler and inhale daily. Ziglar, Susan K, MD  Active             Recommendation:   PCP Follow-up  Follow Up Plan:   Telephone follow-up in 1 month  Hendricks Her RN, BSN  [place] [facility] VBCI-Population Health RN Case Manager   Direct [phone]

## 2024-12-03 ENCOUNTER — Other Ambulatory Visit: Payer: Self-pay | Admitting: Family Medicine

## 2024-12-03 DIAGNOSIS — E782 Mixed hyperlipidemia: Secondary | ICD-10-CM

## 2024-12-07 ENCOUNTER — Telehealth: Payer: Self-pay

## 2024-12-07 NOTE — Telephone Encounter (Signed)
 Patient need to cancel. Call patient back to reschedule. Ok to leave message on sisters phone which is primary number. Patient phone not working

## 2024-12-07 NOTE — Telephone Encounter (Signed)
 Michelle Conley

## 2024-12-10 ENCOUNTER — Ambulatory Visit: Admitting: Pain Medicine

## 2024-12-15 ENCOUNTER — Telehealth

## 2024-12-28 ENCOUNTER — Ambulatory Visit: Admitting: Pain Medicine
